# Patient Record
Sex: Female | Born: 1955 | Race: Asian | Hispanic: No | Marital: Married | Smoking: Never smoker
Health system: Southern US, Community
[De-identification: ages and names within clinical notes are randomized; demographics above are authoritative.]

## PROBLEM LIST (undated history)

## (undated) DIAGNOSIS — E079 Disorder of thyroid, unspecified: Secondary | ICD-10-CM

## (undated) DIAGNOSIS — I10 Essential (primary) hypertension: Secondary | ICD-10-CM

## (undated) DIAGNOSIS — T7840XA Allergy, unspecified, initial encounter: Secondary | ICD-10-CM

## (undated) DIAGNOSIS — G4730 Sleep apnea, unspecified: Secondary | ICD-10-CM

## (undated) DIAGNOSIS — K635 Polyp of colon: Secondary | ICD-10-CM

## (undated) DIAGNOSIS — K589 Irritable bowel syndrome without diarrhea: Secondary | ICD-10-CM

## (undated) DIAGNOSIS — Z860100 Personal history of colon polyps, unspecified: Secondary | ICD-10-CM

## (undated) DIAGNOSIS — E669 Obesity, unspecified: Secondary | ICD-10-CM

## (undated) DIAGNOSIS — E119 Type 2 diabetes mellitus without complications: Secondary | ICD-10-CM

## (undated) DIAGNOSIS — K219 Gastro-esophageal reflux disease without esophagitis: Secondary | ICD-10-CM

## (undated) DIAGNOSIS — J45909 Unspecified asthma, uncomplicated: Secondary | ICD-10-CM

## (undated) DIAGNOSIS — Z8781 Personal history of (healed) traumatic fracture: Secondary | ICD-10-CM

## (undated) DIAGNOSIS — M1990 Unspecified osteoarthritis, unspecified site: Secondary | ICD-10-CM

## (undated) DIAGNOSIS — E785 Hyperlipidemia, unspecified: Secondary | ICD-10-CM

## (undated) DIAGNOSIS — B019 Varicella without complication: Secondary | ICD-10-CM

## (undated) DIAGNOSIS — G43909 Migraine, unspecified, not intractable, without status migrainosus: Secondary | ICD-10-CM

## (undated) HISTORY — DX: Personal history of (healed) traumatic fracture: Z87.81

## (undated) HISTORY — DX: Migraine, unspecified, not intractable, without status migrainosus: G43.909

## (undated) HISTORY — PX: COLONOSCOPY: SHX174

## (undated) HISTORY — DX: Disorder of thyroid, unspecified: E07.9

## (undated) HISTORY — DX: Type 2 diabetes mellitus without complications: E11.9

## (undated) HISTORY — DX: Hyperlipidemia, unspecified: E78.5

## (undated) HISTORY — DX: Personal history of colonic polyps: Z86.010

## (undated) HISTORY — DX: Unspecified asthma, uncomplicated: J45.909

## (undated) HISTORY — DX: Unspecified osteoarthritis, unspecified site: M19.90

## (undated) HISTORY — DX: Gastro-esophageal reflux disease without esophagitis: K21.9

## (undated) HISTORY — DX: Polyp of colon: K63.5

## (undated) HISTORY — DX: Obesity, unspecified: E66.9

## (undated) HISTORY — DX: Allergy, unspecified, initial encounter: T78.40XA

## (undated) HISTORY — DX: Irritable bowel syndrome, unspecified: K58.9

## (undated) HISTORY — DX: Personal history of colon polyps, unspecified: Z86.0100

## (undated) HISTORY — DX: Sleep apnea, unspecified: G47.30

## (undated) HISTORY — DX: Essential (primary) hypertension: I10

## (undated) HISTORY — DX: Varicella without complication: B01.9

## (undated) HISTORY — PX: ESOPHAGOGASTRODUODENOSCOPY: SHX1529

---

## 2010-05-22 DIAGNOSIS — B029 Zoster without complications: Secondary | ICD-10-CM

## 2010-05-22 HISTORY — DX: Zoster without complications: B02.9

## 2014-05-22 HISTORY — PX: REPLACEMENT TOTAL KNEE: SUR1224

## 2017-02-28 ENCOUNTER — Encounter: Payer: Self-pay | Admitting: Family Medicine

## 2017-02-28 ENCOUNTER — Ambulatory Visit (INDEPENDENT_AMBULATORY_CARE_PROVIDER_SITE_OTHER): Payer: Medicare HMO | Admitting: Family Medicine

## 2017-02-28 VITALS — BP 130/80 | HR 67 | Temp 98.3°F | Ht 65.0 in | Wt 201.9 lb

## 2017-02-28 DIAGNOSIS — E785 Hyperlipidemia, unspecified: Secondary | ICD-10-CM

## 2017-02-28 DIAGNOSIS — K219 Gastro-esophageal reflux disease without esophagitis: Secondary | ICD-10-CM

## 2017-02-28 DIAGNOSIS — Z794 Long term (current) use of insulin: Secondary | ICD-10-CM

## 2017-02-28 DIAGNOSIS — I10 Essential (primary) hypertension: Secondary | ICD-10-CM

## 2017-02-28 DIAGNOSIS — J302 Other seasonal allergic rhinitis: Secondary | ICD-10-CM | POA: Diagnosis not present

## 2017-02-28 DIAGNOSIS — E119 Type 2 diabetes mellitus without complications: Secondary | ICD-10-CM

## 2017-02-28 DIAGNOSIS — Z7689 Persons encountering health services in other specified circumstances: Secondary | ICD-10-CM

## 2017-02-28 DIAGNOSIS — H04129 Dry eye syndrome of unspecified lacrimal gland: Secondary | ICD-10-CM | POA: Diagnosis not present

## 2017-02-28 DIAGNOSIS — R682 Dry mouth, unspecified: Secondary | ICD-10-CM | POA: Diagnosis not present

## 2017-02-28 DIAGNOSIS — E039 Hypothyroidism, unspecified: Secondary | ICD-10-CM | POA: Diagnosis not present

## 2017-02-28 DIAGNOSIS — M26609 Unspecified temporomandibular joint disorder, unspecified side: Secondary | ICD-10-CM | POA: Diagnosis not present

## 2017-02-28 LAB — BASIC METABOLIC PANEL
BUN: 7 mg/dL (ref 6–23)
CHLORIDE: 102 meq/L (ref 96–112)
CO2: 30 mEq/L (ref 19–32)
Calcium: 9.7 mg/dL (ref 8.4–10.5)
Creatinine, Ser: 0.81 mg/dL (ref 0.40–1.20)
GFR: 92.14 mL/min (ref >60.00)
Glucose, Bld: 86 mg/dL (ref 70–99)
POTASSIUM: 4.7 meq/L (ref 3.5–5.1)
Sodium: 139 mEq/L (ref 135–145)

## 2017-02-28 LAB — TSH: TSH: 0.49 u[IU]/mL (ref 0.35–4.50)

## 2017-02-28 LAB — HEMOGLOBIN A1C: HEMOGLOBIN A1C: 6.7 % — AB (ref 4.6–6.5)

## 2017-02-28 MED ORDER — INSULIN ASPART 100 UNIT/ML FLEXPEN: 10.0000 [IU] | PEN_INJECTOR | Freq: Three times a day (TID) | SUBCUTANEOUS | 11 refills | Status: DC

## 2017-02-28 MED ORDER — METOPROLOL TARTRATE 25 MG PO TABS: 25.0000 mg | ORAL_TABLET | Freq: Two times a day (BID) | ORAL | 1 refills | Status: DC

## 2017-02-28 MED ORDER — CYCLOBENZAPRINE HCL 10 MG PO TABS: 10.0000 mg | ORAL_TABLET | Freq: Three times a day (TID) | ORAL | 5 refills | Status: DC | PRN

## 2017-02-28 MED ORDER — LEVOTHYROXINE SODIUM 75 MCG PO TABS: 75.0000 ug | ORAL_TABLET | Freq: Every day | ORAL | 2 refills | Status: DC

## 2017-02-28 MED ORDER — INSULIN DETEMIR 100 UNIT/ML FLEXPEN: 50.0000 [IU] | PEN_INJECTOR | Freq: Two times a day (BID) | SUBCUTANEOUS | 11 refills | Status: DC

## 2017-02-28 MED ORDER — SIMVASTATIN 40 MG PO TABS: 40.0000 mg | ORAL_TABLET | Freq: Every day | ORAL | 3 refills | Status: DC

## 2017-02-28 MED ORDER — PEN NEEDLES 32G X 5 MM MISC: 1.0000 "application " | Freq: Every day | 11 refills | Status: DC

## 2017-02-28 MED ORDER — PANTOPRAZOLE SODIUM 40 MG PO TBEC: 40.0000 mg | DELAYED_RELEASE_TABLET | Freq: Every day | ORAL | 2 refills | Status: DC

## 2017-02-28 MED ORDER — BLOOD GLUC METER DISP-STRIPS DEVI: 11 refills | Status: DC

## 2017-02-28 MED ORDER — METFORMIN HCL 1000 MG PO TABS: 1000.0000 mg | ORAL_TABLET | Freq: Two times a day (BID) | ORAL | 1 refills | Status: DC

## 2017-02-28 NOTE — Patient Instructions (Addendum)
We have ordered labs at this visit. It can take up to 1-2 weeks for results and processing. If results require follow up or explanation, we will call you with instructions. Clinically stable results will be released to your [facility] or sent to you via mail. If you have not heard from [place] or cannot find your results in [facility] in 2 weeks please contact our office at [phone].   Diabetes Mellitus and Food It is important for you to manage your blood sugar (glucose) level. Your blood glucose level can be greatly affected by what you eat. Eating healthier foods in the appropriate amounts throughout the day at about the same time each day will help you control your blood glucose level. It can also help slow or prevent worsening of your diabetes mellitus. Healthy eating may even help you improve the level of your blood pressure and reach or maintain a healthy weight. General recommendations for healthful eating and cooking habits include:  Eating meals and snacks regularly. Avoid going long periods of time without eating to lose weight.  Eating a diet that consists mainly of plant-based foods, such as fruits, vegetables, nuts, legumes, and whole grains.  Using low-heat cooking methods, such as baking, instead of high-heat cooking methods, such as deep frying.  Work with your dietitian to make sure you understand how to use the Nutrition Facts information on food labels. How can food affect me? Carbohydrates Carbohydrates affect your blood glucose level more than any other type of food. Your dietitian will help you determine how many carbohydrates to eat at each meal and teach you how to count carbohydrates. Counting carbohydrates is important to keep your blood glucose at a healthy level, especially if you are using insulin or taking certain medicines for diabetes mellitus. Alcohol Alcohol can cause sudden decreases in blood glucose (hypoglycemia), especially if you use insulin or take certain medicines  for diabetes mellitus. Hypoglycemia can be a life-threatening condition. Symptoms of hypoglycemia (sleepiness, dizziness, and disorientation) are similar to symptoms of having too much alcohol. If your health care provider has given you approval to drink alcohol, do so in moderation and use the following guidelines:  Women should not have more than one drink per day, and men should not have more than two drinks per day. One drink is equal to: ? 12 oz of beer. ? 5 oz of wine. ? 1 oz of hard liquor.  Do not drink on an empty stomach.  Keep yourself hydrated. Have water, diet soda, or unsweetened iced tea.  Regular soda, juice, and other mixers might contain a lot of carbohydrates and should be counted.  What foods are not recommended? As you make food choices, it is important to remember that all foods are not the same. Some foods have fewer nutrients per serving than other foods, even though they might have the same number of calories or carbohydrates. It is difficult to get your body what it needs when you eat foods with fewer nutrients. Examples of foods that you should avoid that are high in calories and carbohydrates but low in nutrients include:  Trans fats (most processed foods list trans fats on the Nutrition Facts label).  Regular soda.  Juice.  Candy.  Sweets, such as cake, pie, doughnuts, and cookies.  Fried foods.  What foods can I eat? Eat nutrient-rich foods, which will nourish your body and keep you healthy. The food you should eat also will depend on several factors, including:  The calories you need.  The medicines you take.  Your weight.  Your blood glucose level.  Your blood pressure level.  Your cholesterol level.  You should eat a variety of foods, including:  Protein. ? Lean cuts of meat. ? Proteins low in saturated fats, such as fish, egg whites, and beans. Avoid processed meats.  Fruits and vegetables. ? Fruits and vegetables that may help  control blood glucose levels, such as apples, mangoes, and yams.  Dairy products. ? Choose fat-free or low-fat dairy products, such as milk, yogurt, and cheese.  Grains, bread, pasta, and rice. ? Choose whole grain products, such as multigrain bread, whole oats, and brown rice. These foods may help control blood pressure.  Fats. ? Foods containing healthful fats, such as nuts, avocado, olive oil, canola oil, and fish.  Does everyone with diabetes mellitus have the same meal plan? Because every person with diabetes mellitus is different, there is not one meal plan that works for everyone. It is very important that you meet with a dietitian who will help you create a meal plan that is just right for you. This information is not intended to replace advice given to you by your health care provider. Make sure you discuss any questions you have with your health care provider. Document Released: 02/02/2005 Document Revised: 10/14/2015 Document Reviewed: 04/04/2013 Elsevier Interactive Patient Education  2017 [place].  Hypothyroidism Hypothyroidism is a disorder of the thyroid. The thyroid is a large gland that is located in the lower front of the neck. The thyroid releases hormones that control how the body works. With hypothyroidism, the thyroid does not make enough of these hormones. What are the causes? Causes of hypothyroidism may include:  Viral infections.  Pregnancy.  Your own defense system (immune system) attacking your thyroid.  Certain medicines.  Birth defects.  Past radiation treatments to your head or neck.  Past treatment with radioactive iodine.  Past surgical removal of part or all of your thyroid.  Problems with the gland that is located in the center of your brain (pituitary).  What are the signs or symptoms? Signs and symptoms of hypothyroidism may include:  Feeling as though you have no energy (lethargy).  Inability to tolerate cold.  Weight gain  that is not explained by a change in diet or exercise habits.  Dry skin.  Coarse hair.  Menstrual irregularity.  Slowing of thought processes.  Constipation.  Sadness or depression.  How is this diagnosed? Your health care provider may diagnose hypothyroidism with blood tests and ultrasound tests. How is this treated? Hypothyroidism is treated with medicine that replaces the hormones that your body does not make. After you begin treatment, it may take several weeks for symptoms to go away. Follow these instructions at home:  Take medicines only as directed by your health care provider.  If you start taking any new medicines, tell your health care provider.  Keep all follow-up visits as directed by your health care provider. This is important. As your condition improves, your dosage needs may change. You will need to have blood tests regularly so that your health care provider can watch your condition. Contact a health care provider if:  Your symptoms do not get better with treatment.  You are taking thyroid replacement medicine and: ? You sweat excessively. ? You have tremors. ? You feel anxious. ? You lose weight rapidly. ? You cannot tolerate heat. ? You have emotional swings. ? You have diarrhea. ? You feel weak. Get help right away if:  You develop chest pain.  You develop an irregular heartbeat.  You develop a rapid heartbeat. This information is not intended to replace advice given to you by your health care provider. Make sure you discuss any questions you have with your health care provider. Document Released: 05/08/2005 Document Revised: 10/14/2015 Document Reviewed: 09/23/2013 Elsevier Interactive Patient Education  2017 [employer] Temporomandibular Joint Syndrome Temporomandibular joint (TMJ) syndrome is a condition that affects the joints between your jaw and your skull. The TMJs are located near your ears and allow your jaw to open and close. These  joints and the nearby muscles are involved in all movements of the jaw. People with TMJ syndrome have pain in the area of these joints and muscles. Chewing, biting, or other movements of the jaw can be difficult or painful. TMJ syndrome can be caused by various things. In many cases, the condition is mild and goes away within a few weeks. For some people, the condition can become a long-term problem. What are the causes? Possible causes of TMJ syndrome include:  Grinding your teeth or clenching your jaw. Some people do this when they are under stress.  Arthritis.  Injury to the jaw.  Head or neck injury.  Teeth or dentures that are not aligned well.  In some cases, the cause of TMJ syndrome may not be known. What are the signs or symptoms? The most common symptom is an aching pain on the side of the head in the area of the TMJ. Other symptoms may include:  Pain when moving your jaw, such as when chewing or biting.  Being unable to open your jaw all the way.  Making a clicking sound when you open your mouth.  Headache.  Earache.  Neck or shoulder pain.  How is this diagnosed? Diagnosis can usually be made based on your symptoms, your medical history, and a physical exam. Your health care provider may check the range of motion of your jaw. Imaging tests, such as X-rays or an MRI, are sometimes done. You may need to see your dentist to determine if your teeth and jaw are lined up correctly. How is this treated? TMJ syndrome often goes away on its own. If treatment is needed, the options may include:  Eating soft foods and applying ice or heat.  Medicines to relieve pain or inflammation.  Medicines to relax the muscles.  A splint, bite plate, or mouthpiece to prevent teeth grinding or jaw clenching.  Relaxation techniques or counseling to help reduce stress.  Transcutaneous electrical nerve stimulation (TENS). This helps to relieve pain by applying an electrical current  through the skin.  Acupuncture. This is sometimes helpful to relieve pain.  Jaw surgery. This is rarely needed.  Follow these instructions at home:  Take medicines only as directed by your health care provider.  Eat a soft diet if you are having trouble chewing.  Apply ice to the painful area. ? Put ice in a plastic bag. ? Place a towel between your skin and the bag. ? Leave the ice on for 20 minutes, 2-3 times a day.  Apply a warm compress to the painful area as directed.  Massage your jaw area and perform any jaw stretching exercises as recommended by your health care provider.  If you were given a mouthpiece or bite plate, wear it as directed.  Avoid foods that require a lot of chewing. Do not chew gum.  Keep all follow-up visits as directed by your health care provider. This  is important. Contact a health care provider if:  You are having trouble eating.  You have new or worsening symptoms. Get help right away if:  Your jaw locks open or closed. This information is not intended to replace advice given to you by your health care provider. Make sure you discuss any questions you have with your health care provider. Document Released: 01/31/2001 Document Revised: 01/06/2016 Document Reviewed: 12/11/2013 Elsevier Interactive Patient Education  [employer].

## 2017-02-28 NOTE — Progress Notes (Signed)
Patient presents to clinic today to establish care.  SUBJECTIVE: PMH:  Pt is a 61 yo female with pmh sig for DM, hypothyroidism with nodule, dry eyes, HTN, TMJ, and dry mouth.  Pt was formerly seen by Dr. Merita Norton in [place].  Pt was also seen at [facility].  DM: -dx'd in 2008 - +family hx, brother,sisters -tried oral meds, but they did not control her bs -Taking levemir flex pen 50 U BID and Novolog 10 u TID with meals -Also taking metformin 1000 mg BID -fsbs at home range 80-110 in am also checks at night.  Hypothyroidism: -taking Synthroid 75 mcg daily  ~Name brand worked better for her than generic~ -also had nodules on thyroid.  U/S in 2016. -had biopsy -requesting Endo referral for f/u  Allergies: -takes allegra prn and flonase -notices slight increase in eye irritation since moving here  Dry Eyes: -x 20 yrs -uses gtts during the day and ointment at night -if does not use ointment at night wakes up with painful eyes  HTN: -taking metoprolol 25 mg BID  TMJ: -chronic  -takes flexaril BID.  Some days pain requires a third dose  Dry Mouth: -chronic -has tried biotiene mouth rinse, but it did not help -mouth gets so dry that it can cause sores on the inside of her cheeks -chews gum, but that causes pain 2/2 TMJ  Allergies: NKDA  PSurgHx: -L TKR 2016   Social Hx: Pt and her husband recently moved to the area from [place].  Pt has 3 kids.  She has a daughter and a son here in [place].  She is living with her daughter until her house in [place] sells.  Pt has another daughter who is still in [place].  Pt denies tobacco, EtOH, and drug use.    Past Medical History:  Diagnosis Date  . Allergy   . Arthritis   . Chicken pox   . Colon polyps   . Diabetes mellitus without complication ([place])   . GERD (gastroesophageal reflux disease)   . Hyperlipidemia   . Hypertension   . Migraines     Past Surgical History:  Procedure  Laterality Date  . REPLACEMENT TOTAL KNEE Left 2016    No current outpatient prescriptions on file prior to visit.   No current facility-administered medications on file prior to visit.     No Known Allergies  History reviewed. No pertinent family history.  Social History   Social History  . Marital status: Married    Spouse name: N/A  . Number of children: N/A  . Years of education: N/A   Occupational History  . Not on file.   Social History Main Topics  . Smoking status: Never Smoker  . Smokeless tobacco: Never Used  . Alcohol use No  . Drug use: No  . Sexual activity: Not on file   Other Topics Concern  . Not on file   Social History Narrative  . No narrative on file    ROS General: Denies fever, chills, night sweats, changes in weight, changes in appetite HEENT: Denies headaches, ear pain, changes in vision, rhinorrhea, sore throat  +jaw pain, dry eyes, dry mouth, thyroid nodule. CV: Denies CP, palpitations, SOB, orthopnea Pulm: Denies SOB, cough, wheezing GI: Denies abdominal pain, nausea, vomiting, diarrhea, constipation GU: Denies dysuria, hematuria, frequency, vaginal discharge Msk: Denies muscle cramps, joint pains Neuro: Denies weakness, numbness, tingling Skin: Denies rashes, bruising Psych: Denies depression, anxiety, hallucinations  BP 130/80 (BP  Location: Right Arm, Patient Position: Sitting, Cuff Size: Normal)   Pulse 67   Temp 98.3 F (36.8 C) (Oral)   Ht 5' 5"  (1.651 m)   Wt 201 lb 14.4 oz (91.6 kg)   BMI 33.60 kg/m   Physical Exam Gen. Pleasant, well developed, well-nourished, in NAD HEENT - [place]/AT, PERRL, no scleral icterus, no nasal drainage, pharynx without erythema or exudate.  No enlarged parotid glands. Neck: No JVD, mild thyromegaly, no carotid bruits Lungs: no accessory muscle use, CTAB, no wheezes, rales or rhonchi Cardiovascular: RRR, No r/g/m, no peripheral edema Abdomen: BS present, soft, nontender,nondistended, no  hepatosplenomegaly Musculoskeletal: No deformities, moves all four extremities, no cyanosis or clubbing, normal tone.  L TKR, well healed. Neuro:  A&Ox3, CN II-XII intact, normal gait Skin:  Warm, dry, intact, no lesions Psych: normal affect, mood appropriate  No results found for this or any previous visit (from the past 2160 hour(s)).  Assessment/Plan: Controlled type 2 diabetes mellitus without complication, with long-term current use of insulin ([place])  -encouraged to continue eating better and drinking plenty of water -will order labs and refill current meds. -continue checking fsbs - Plan: insulin aspart (NOVOLOG FLEXPEN) 100 UNIT/ML FlexPen, Insulin Detemir (LEVEMIR FLEXPEN) 100 UNIT/ML Pen, metFORMIN (GLUCOPHAGE) 1000 MG tablet, Hemoglobin A1c, Ambulatory referral to Endocrinology  Acquired hypothyroidism  -Continue Synthroid 75 mcg daily -mildly enlarged gland on exam -will obtain labs and place referral to Endo for u/s and f/u - Plan: TSH, levothyroxine (SYNTHROID, LEVOTHROID) 75 MCG tablet, Ambulatory referral to Endocrinology, TSH  Seasonal allergies -continue allegra prn and flonase -discussed proper nasal spray use.  Dry eye -continue eye gtts and ointment -discussed finding an Ophthalmologist for possibly newer treatment options.  Essential hypertension  -bp controlled 130/80 -continue current meds - Plan: CBC with Differential/Platelet, Basic metabolic panel, metoprolol tartrate (LOPRESSOR) 25 MG tablet, CBC with Differential/Platelet  Dry mouth - given dry eyes and mouth consider Sjogren's, however no enlarged Parotid gland on exam  TMJ (temporomandibular joint disorder)  -worsened by gum chewing for  - Plan: cyclobenzaprine (FLEXERIL) 10 MG tablet  Hyperlipidemia, unspecified hyperlipidemia type  - Plan: simvastatin (ZOCOR) 40 MG tablet  Gastroesophageal reflux disease without esophagitis  -avoid triggers - Plan: pantoprazole (PROTONIX) 40 MG  tablet  Encounter to establish care -records release   Refill given on all meds, pen needles, and strips.  RTC in 1-2 mos

## 2017-03-01 LAB — CBC WITH DIFFERENTIAL/PLATELET
BASOS ABS: 0.1 10*3/uL (ref 0.0–0.1)
Basophils Relative: 0.8 % (ref 0.0–3.0)
EOS PCT: 7.9 % — AB (ref 0.0–5.0)
Eosinophils Absolute: 0.6 10*3/uL (ref 0.0–0.7)
HEMATOCRIT: 37.4 % (ref 36.0–46.0)
HEMOGLOBIN: 12.1 g/dL (ref 12.0–15.0)
LYMPHS ABS: 2.5 10*3/uL (ref 0.7–4.0)
LYMPHS PCT: 35.6 % (ref 12.0–46.0)
MCHC: 32.2 g/dL (ref 30.0–36.0)
MCV: 81.7 fl (ref 78.0–100.0)
MONOS PCT: 6.4 % (ref 3.0–12.0)
Monocytes Absolute: 0.5 10*3/uL (ref 0.1–1.0)
NEUTROS PCT: 49.3 % (ref 43.0–77.0)
Neutro Abs: 3.4 10*3/uL (ref 1.4–7.7)
Platelets: 314 10*3/uL (ref 150.0–400.0)
RBC: 4.58 Mil/uL (ref 3.87–5.11)
RDW: 15 % (ref 11.5–15.5)
WBC: 7 10*3/uL (ref 4.0–10.5)

## 2017-03-26 ENCOUNTER — Telehealth: Payer: Self-pay | Admitting: Family Medicine

## 2017-03-26 NOTE — Telephone Encounter (Signed)
Pt was seen on 02-28-17 and her new patient visit was not covered. Pt has [employer]. The bill was over 500.00 .

## 2017-03-30 ENCOUNTER — Ambulatory Visit: Payer: Medicare HMO | Admitting: Endocrinology

## 2017-05-01 ENCOUNTER — Ambulatory Visit: Payer: Medicare HMO | Admitting: Endocrinology

## 2017-05-24 NOTE — Telephone Encounter (Signed)
Patient calling again, was told that this was fixed. Still receiving bill. Please advise

## 2017-05-25 ENCOUNTER — Encounter: Payer: Self-pay | Admitting: Family Medicine

## 2017-05-25 ENCOUNTER — Ambulatory Visit (INDEPENDENT_AMBULATORY_CARE_PROVIDER_SITE_OTHER)
Admission: RE | Admit: 2017-05-25 | Discharge: 2017-05-25 | Disposition: A | Discharge status: Home / Self Care | Payer: Medicare HMO | Source: Ambulatory Visit | Attending: Family Medicine | Admitting: Family Medicine

## 2017-05-25 ENCOUNTER — Ambulatory Visit (INDEPENDENT_AMBULATORY_CARE_PROVIDER_SITE_OTHER): Payer: Medicare HMO | Admitting: Family Medicine

## 2017-05-25 VITALS — BP 140/90 | HR 67 | Temp 98.8°F | Wt 202.0 lb

## 2017-05-25 DIAGNOSIS — E118 Type 2 diabetes mellitus with unspecified complications: Secondary | ICD-10-CM | POA: Diagnosis not present

## 2017-05-25 DIAGNOSIS — M19071 Primary osteoarthritis, right ankle and foot: Secondary | ICD-10-CM | POA: Diagnosis not present

## 2017-05-25 DIAGNOSIS — K5190 Ulcerative colitis, unspecified, without complications: Secondary | ICD-10-CM | POA: Diagnosis not present

## 2017-05-25 DIAGNOSIS — Z794 Long term (current) use of insulin: Secondary | ICD-10-CM

## 2017-05-25 DIAGNOSIS — M79671 Pain in right foot: Secondary | ICD-10-CM

## 2017-05-25 MED ORDER — INSULIN DETEMIR 100 UNIT/ML FLEXPEN: 50.0000 [IU] | PEN_INJECTOR | Freq: Two times a day (BID) | SUBCUTANEOUS | 11 refills | Status: DC

## 2017-05-25 MED ORDER — MESALAMINE ER 500 MG PO CPCR: 500.0000 mg | ORAL_CAPSULE | Freq: Four times a day (QID) | ORAL | 0 refills | Status: DC

## 2017-05-25 NOTE — Progress Notes (Signed)
Subjective:    Patient ID: Terri Gray, female    DOB: 12/29/1955, 62 y.o.   MRN: [id number]  No chief complaint on file.   HPI Patient was seen today for acute concern and follow-up.  Patient states she has been having right lateral foot pain times 3 weeks.  She cannot recall injury.  She is tried BenGay, wrapping the ankle and an ankle brace with some relief.  Patient has been out of town as her daughter had to have emergency heart surgery.  Patient also endorses history of ulcerative colitis.  She states she is currently having an ulcerative colitis flare and is in need of medication to help resolve this.  Patient denies hematochezia.  Patient also endorses being out of Levemir.  Patient typically uses 5 boxes/month, however the pharmacy only gave her a few pens.  Pt attempted to contact clinic.     Past Medical History:  Diagnosis Date  . Allergy   . Arthritis   . Chicken pox   . Colon polyps   . Diabetes mellitus without complication ([place])   . GERD (gastroesophageal reflux disease)   . Hyperlipidemia   . Hypertension   . Migraines     No Known Allergies  ROS General: Denies fever, chills, night sweats, changes in weight, changes in appetite HEENT: Denies headaches, ear pain, changes in vision, rhinorrhea, sore throat CV: Denies CP, palpitations, SOB, orthopnea Pulm: Denies SOB, cough, wheezing GI: Denies nausea, vomiting, diarrhea, constipation  +abd pain/UC flare GU: Denies dysuria, hematuria, frequency, vaginal discharge Msk: Denies muscle cramps, joint pains +R foot pain Neuro: Denies weakness, numbness, tingling Skin: Denies rashes, bruising Psych: Denies depression, anxiety, hallucinations     Objective:    Blood pressure 140/90, pulse 67, temperature 98.8 F (37.1 C), temperature source Oral, weight 202 lb (91.6 kg).   Gen. Pleasant, well-nourished, in no distress, normal affect HEENT: [place]/AT, face symmetric, no scleral icterus, PERRLA, nares patent  without drainage  Lungs: no accessory muscle use, CTAB, no wheezes or rales Cardiovascular: RRR, no m/r/g, no peripheral edema Abdomen: BS present, soft, NT/ND Musculoskeletal: No deformities, no cyanosis or clubbing, normal tone.  TTP of R foot and ankle at lateral side of calcanus inferior to the fibula and navicular bone Neuro:  A&Ox3, CN II-XII intact, normal gait Skin:  Warm, no lesions/ rash   Wt Readings from Last 3 Encounters:  05/25/17 202 lb (91.6 kg)  02/28/17 201 lb 14.4 oz (91.6 kg)    Lab Results  Component Value Date   WBC 7.0 02/28/2017   HGB 12.1 02/28/2017   HCT 37.4 02/28/2017   PLT 314.0 02/28/2017   GLUCOSE 86 02/28/2017   NA 139 02/28/2017   K 4.7 02/28/2017   CL 102 02/28/2017   CREATININE 0.81 02/28/2017   BUN 7 02/28/2017   CO2 30 02/28/2017   TSH 0.49 02/28/2017   HGBA1C 6.7 (H) 02/28/2017    Assessment/Plan:  Foot pain, right -Duration of symptoms and unclear mechanism of injury proceed with x-ray. -Will contact patient later this afternoon with x-ray results.  Based on x-ray results will then decide if continued conservative treatment is recommended or referral is needed - Plan: DG Foot Complete Right  Ulcerative colitis without complications, unspecified location ([place]) - Plan: mesalamine (PENTASA) 500 MG CR capsule  Controlled type 2 diabetes mellitus with complication, with long-term current use of insulin ([place]) -Continue lifestyle modifications and regular blood sugar monitoring at home -Continue NovoLog 10 units 3 times daily - Plan:  Insulin Detemir (LEVEMIR FLEXPEN) 100 UNIT/ML Pen -We will do foot exam at next visit.     Follow-up in 1 month.  Sooner if needed   Update: Patient contacted in regards to x-ray of right foot results.  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  Patient advised to wear supportive shoe, use Tylenol or NSAIDs for pain relief, ice, stretching.  Reviewed stretching  exercises over the phone with patient.  Patient recalls being told she had a heel spur before.  Patient will contact clinic if pain continues.   [name], MD

## 2017-05-25 NOTE — Patient Instructions (Addendum)
Ulcerative Colitis, Adult Ulcerative colitis is long-lasting (chronic) swelling (inflammation) of the large intestine (colon). Sores (ulcers) may also form on the colon. Ulcerative colitis is closely related to another condition of inflammation of the intestines that is called Crohn disease. Together, they are frequently referred to as inflammatory bowel disease (IBD). What are the causes? Ulcerative colitis is caused by increased activity of the immune system in the intestines. The immune system is the system that protects the body against harmful bacteria, viruses, fungi, and other things that can make you sick. When the immune system overacts, it causes inflammation. The cause of the increased immune system activity is not known. What increases the risk? Risk factors of ulcerative colitis include:  Age. This includes: ? Being 67-58 years old. ? Being older than 63 years old.  Having a family history of ulcerative colitis.  Being of Jewish descent.  What are the signs or symptoms? Common symptoms of ulcerative colitis include rectal bleeding and diarrhea. There is a wide range of symptoms, and a person's symptoms depend on how severe the condition is. Additional symptoms may include:  Pain or cramping in the belly (abdomen).  Fever.  Fatigue.  Weight loss.  Night sweats.  Rectal pain.  Feeling the immediate need to have a bowel movement.  Nausea.  Loss of appetite.  Anemia.  Joint pain or soreness.  Eye irritation.  Certain skin rashes.  How is this diagnosed? Ulcerative colitis may be diagnosed by:  Medical history and physical exam.  Blood tests and stool tests.  X-rays.  CT scans.  Colonoscopy. For this test, a flexible tube is inserted into your anus and your colon is examined.  Examination of a tissue sample from your colon (biopsy).  How is this treated? Treatment for ulcerative colitis may include medicines to:  Decrease inflammation.  Control  your immune system.  Surgery may also be necessary. Follow these instructions at home: Medicines and vitamins  Take medicines only as directed by your doctor. Do not take aspirin.  Ask your doctor if you should take any vitamins or supplements. Lifestyle  Exercise regularly.  Limit alcohol intake to no more than 1 drink per day for nonpregnant women and 2 drinks per day for men. One drink equals 12 ounces of beer, 5 ounces of wine, or 1 ounces of hard liquor. Eating and drinking  Drink enough fluid to keep your urine clear or pale yellow.  Ask your health care provider about the best diet for you. Follow the diet as directed by your health care provider. This may include: ? Avoiding carbonated drinks. ? Avoiding popcorn, vegetable skins, nuts, and other high-fiber foods when you have symptoms of ulcerative colitis. ? Eating smaller meals more often. ? Keeping a food diary. This may help you to find and avoid any foods that make you feel not well.  Limit your caffeine intake. General instructions  Keep all follow-up appointments as directed by your health care provider. This is important. Contact a health care provider if:  Your symptoms do not improve or get worse with treatment.  You continue to lose weight.  You have constant cramps or loose bowels.  You develop a new skin rash, skin sores, or eye problems.  You have a fever or chills. Get help right away if:  You have bloody diarrhea.  You have severe pain in your abdomen.  You vomit. This information is not intended to replace advice given to you by your health care provider. Make sure  you discuss any questions you have with your health care provider. Document Released: 02/15/2005 Document Revised: 01/09/2016 Document Reviewed: 08/31/2014 Elsevier Interactive Patient Education  [employer].

## 2017-05-29 ENCOUNTER — Encounter: Payer: Self-pay | Admitting: Endocrinology

## 2017-05-29 ENCOUNTER — Ambulatory Visit: Payer: Medicare HMO | Admitting: Endocrinology

## 2017-05-29 DIAGNOSIS — E1142 Type 2 diabetes mellitus with diabetic polyneuropathy: Secondary | ICD-10-CM

## 2017-05-29 DIAGNOSIS — E119 Type 2 diabetes mellitus without complications: Secondary | ICD-10-CM | POA: Diagnosis not present

## 2017-05-29 DIAGNOSIS — Z794 Long term (current) use of insulin: Secondary | ICD-10-CM

## 2017-05-29 MED ORDER — INSULIN GLARGINE 100 UNIT/ML SOLOSTAR PEN: 60.0000 [IU] | PEN_INJECTOR | Freq: Every day | SUBCUTANEOUS | 11 refills | Status: DC

## 2017-05-29 MED ORDER — PEN NEEDLES 32G X 5 MM MISC: 1.0000 "application " | Freq: Four times a day (QID) | 11 refills | Status: DC

## 2017-05-29 MED ORDER — GLUCOSE BLOOD VI STRP: 1.0000 | ORAL_STRIP | Freq: Two times a day (BID) | 3 refills | Status: DC

## 2017-05-29 MED ORDER — INSULIN ASPART 100 UNIT/ML FLEXPEN
10.0000 [IU] | PEN_INJECTOR | Freq: Three times a day (TID) | SUBCUTANEOUS | 11 refills | Status: DC
Start: 2017-05-29 — End: 2017-08-27

## 2017-05-29 NOTE — Progress Notes (Signed)
Subjective:    Patient ID: Terri Gray, female    DOB: 1955/09/28, 62 y.o.   MRN: [id number]  HPI pt is referred by Dr Volanda Napoleon, for diabetes.  Pt states DM was dx'ed in 2008; she has mild neuropathy of the lower extremities, and associated pain; she has been on insulin since 2012; pt says her diet and exercise are fair; she has never had GDM, pancreatitis, pancreatic surgery, severe hypoglycemia or DKA.  She takes multiple daily injections.  She says cbg's vary from 79-290.  It is in general higher as the day goes on.  Ins no longer pays for levemir.   Past Medical History:  Diagnosis Date  . Allergy   . Arthritis   . Chicken pox   . Colon polyps   . Diabetes mellitus without complication ([place])   . GERD (gastroesophageal reflux disease)   . Hyperlipidemia   . Hypertension   . Migraines     Past Surgical History:  Procedure Laterality Date  . REPLACEMENT TOTAL KNEE Left 2016    Social History   Socioeconomic History  . Marital status: Married    Spouse name: Not on file  . Number of children: Not on file  . Years of education: Not on file  . Highest education level: Not on file  Social Needs  . Financial resource strain: Not on file  . Food insecurity - worry: Not on file  . Food insecurity - inability: Not on file  . Transportation needs - medical: Not on file  . Transportation needs - non-medical: Not on file  Occupational History  . Not on file  Tobacco Use  . Smoking status: Never Smoker  . Smokeless tobacco: Never Used  Substance and Sexual Activity  . Alcohol use: No  . Drug use: No  . Sexual activity: Not on file  Other Topics Concern  . Not on file  Social History Narrative  . Not on file    Current Outpatient Medications on File Prior to Visit  Medication Sig Dispense Refill  . cyclobenzaprine (FLEXERIL) 10 MG tablet Take 1 tablet (10 mg total) by mouth 3 (three) times daily as needed for muscle spasms. 90 tablet 5  . levothyroxine (SYNTHROID,  LEVOTHROID) 75 MCG tablet Take 1 tablet (75 mcg total) by mouth daily before breakfast. 90 tablet 2  . mesalamine (PENTASA) 500 MG CR capsule Take 1 capsule (500 mg total) by mouth 4 (four) times daily. 112 capsule 0  . metFORMIN (GLUCOPHAGE) 1000 MG tablet Take 1 tablet (1,000 mg total) by mouth 2 (two) times daily with a meal. 180 tablet 1  . metoprolol tartrate (LOPRESSOR) 25 MG tablet Take 1 tablet (25 mg total) by mouth 2 (two) times daily. 180 tablet 1  . pantoprazole (PROTONIX) 40 MG tablet Take 1 tablet (40 mg total) by mouth daily. 90 tablet 2  . simvastatin (ZOCOR) 40 MG tablet Take 1 tablet (40 mg total) by mouth daily. 90 tablet 3   No current facility-administered medications on file prior to visit.     No Known Allergies  Family History  Problem Relation Age of Onset  . Diabetes Father     BP 132/90 (BP Location: Left Arm, Patient Position: Sitting, Cuff Size: Normal)   Pulse 64   Wt 201 lb 3.2 oz (91.3 kg)   SpO2 98%   BMI 33.48 kg/m   Review of Systems denies blurry vision, sob, n/v, urinary frequency, excessive diaphoresis, memory loss, depression, and cold intolerance.  She  has lost weight (15 lbs), due to her efforts.  She has headache, leg cramps, rhinorrhea, and easy bruising.  She hs chronic intermitt chest pain (she says cardiac eval was neg).     Objective:   Physical Exam VS: see vs page GEN: no distress HEAD: head: no deformity eyes: no periorbital swelling, no proptosis external nose and ears are normal mouth: no lesion seen NECK: supple, thyroid is not enlarged CHEST WALL: no deformity LUNGS: clear to auscultation CV: reg rate and rhythm, no murmur ABD: abdomen is soft, nontender.  no hepatosplenomegaly.  not distended.  no hernia MUSCULOSKELETAL: muscle bulk and strength are grossly normal.  no obvious joint swelling.  gait is normal and steady EXTEMITIES: no deformity.  no ulcer on the feet.  feet are of normal color and temp.  no edema.  There  is bilateral onychomycosis of the toenails PULSES: dorsalis pedis intact bilat.  no carotid bruit NEURO:  cn 2-12 grossly intact.   readily moves all 4's.  sensation is intact to touch on the feet, but decreased from normal SKIN:  Normal texture and temperature.  No rash or suspicious lesion is visible.   NODES:  None palpable at the neck PSYCH: alert, well-oriented.  Does not appear anxious nor depressed.   A1c=6.9%  Lab Results  Component Value Date   CREATININE 0.81 02/28/2017   BUN 7 02/28/2017   NA 139 02/28/2017   K 4.7 02/28/2017   CL 102 02/28/2017   CO2 30 02/28/2017   I have reviewed outside records, and summarized: Pt was noted to have elevated a1c, and referred here. Problems addressed were UC and foot pain.  She had run out of levemir     Assessment & Plan:  Insulin-requiring type 2 DM, with painful polyneuropathy, new to me.  Based on the pattern of her cbg's, she needs some adjustment in her therapy.   Patient Instructions  good diet and exercise significantly improve the control of your diabetes.  please let me know if you wish to be referred to a dietician.  high blood sugar is very risky to your health.  you should see an eye doctor and dentist every year.  It is very important to get all recommended vaccinations.  Controlling your blood pressure and cholesterol drastically reduces the damage diabetes does to your body.  Those who smoke should quit.  Please discuss these with your doctor.  check your blood sugar twice a day.  vary the time of day when you check, between before the 3 meals, and at bedtime.  also check if you have symptoms of your blood sugar being too high or too low.  please keep a record of the readings and bring it to your next appointment here (or you can bring the meter itself).  You can write it on any piece of paper.  please call us sooner if your blood sugar goes below 70, or if you have a lot of readings over 200.  For now, please: Please  continue the same novolog, and:  Change the levemir to lantus, 60 units at bedtime, and: Please continue the same metformin.   Please call or message [place] next week, to tell us how the blood sugar is doing.   Please come back for a follow-up appointment in 2 months.

## 2017-05-29 NOTE — Patient Instructions (Addendum)
good diet and exercise significantly improve the control of your diabetes.  please let me know if you wish to be referred to a dietician.  high blood sugar is very risky to your health.  you should see an eye doctor and dentist every year.  It is very important to get all recommended vaccinations.  Controlling your blood pressure and cholesterol drastically reduces the damage diabetes does to your body.  Those who smoke should quit.  Please discuss these with your doctor.  check your blood sugar twice a day.  vary the time of day when you check, between before the 3 meals, and at bedtime.  also check if you have symptoms of your blood sugar being too high or too low.  please keep a record of the readings and bring it to your next appointment here (or you can bring the meter itself).  You can write it on any piece of paper.  please call us sooner if your blood sugar goes below 70, or if you have a lot of readings over 200.  For now, please: Please continue the same novolog, and:  Change the levemir to lantus, 60 units at bedtime, and: Please continue the same metformin.   Please call or message [place] next week, to tell us how the blood sugar is doing.   Please come back for a follow-up appointment in 2 months.

## 2017-05-30 DIAGNOSIS — E119 Type 2 diabetes mellitus without complications: Secondary | ICD-10-CM | POA: Insufficient documentation

## 2017-05-30 LAB — POCT GLYCOSYLATED HEMOGLOBIN (HGB A1C): Hemoglobin A1C: 6.9

## 2017-06-19 ENCOUNTER — Other Ambulatory Visit: Payer: Self-pay

## 2017-06-19 MED ORDER — INSULIN PEN NEEDLE 32G X 5 MM MISC: 2 refills | Status: DC

## 2017-07-27 ENCOUNTER — Ambulatory Visit: Payer: Medicare HMO | Admitting: Endocrinology

## 2017-07-27 DIAGNOSIS — Z0289 Encounter for other administrative examinations: Secondary | ICD-10-CM

## 2017-08-23 ENCOUNTER — Ambulatory Visit: Payer: Medicare HMO | Admitting: Endocrinology

## 2017-08-24 ENCOUNTER — Ambulatory Visit: Payer: Medicare HMO | Admitting: Family Medicine

## 2017-08-24 DIAGNOSIS — Z0289 Encounter for other administrative examinations: Secondary | ICD-10-CM

## 2017-08-27 ENCOUNTER — Encounter: Payer: Self-pay | Admitting: Endocrinology

## 2017-08-27 ENCOUNTER — Ambulatory Visit: Payer: Medicare HMO | Admitting: Endocrinology

## 2017-08-27 ENCOUNTER — Telehealth: Payer: Self-pay | Admitting: Endocrinology

## 2017-08-27 ENCOUNTER — Other Ambulatory Visit: Payer: Self-pay

## 2017-08-27 VITALS — BP 142/96 | HR 61 | Wt 204.4 lb

## 2017-08-27 DIAGNOSIS — E039 Hypothyroidism, unspecified: Secondary | ICD-10-CM

## 2017-08-27 DIAGNOSIS — Z794 Long term (current) use of insulin: Principal | ICD-10-CM

## 2017-08-27 DIAGNOSIS — E1142 Type 2 diabetes mellitus with diabetic polyneuropathy: Secondary | ICD-10-CM | POA: Diagnosis not present

## 2017-08-27 DIAGNOSIS — E119 Type 2 diabetes mellitus without complications: Secondary | ICD-10-CM

## 2017-08-27 LAB — POCT GLYCOSYLATED HEMOGLOBIN (HGB A1C): Hemoglobin A1C: 6.9

## 2017-08-27 MED ORDER — INSULIN DETEMIR 100 UNIT/ML FLEXPEN: 70.0000 [IU] | PEN_INJECTOR | Freq: Every day | SUBCUTANEOUS | 11 refills | Status: DC

## 2017-08-27 MED ORDER — INSULIN ASPART 100 UNIT/ML FLEXPEN: 20.0000 [IU] | PEN_INJECTOR | Freq: Three times a day (TID) | SUBCUTANEOUS | 11 refills | Status: DC

## 2017-08-27 MED ORDER — LEVOTHYROXINE SODIUM 75 MCG PO TABS: 75.0000 ug | ORAL_TABLET | Freq: Every day | ORAL | 2 refills | Status: DC

## 2017-08-27 MED ORDER — METFORMIN HCL 1000 MG PO TABS: 1000.0000 mg | ORAL_TABLET | Freq: Two times a day (BID) | ORAL | 1 refills | Status: DC

## 2017-08-27 NOTE — Patient Instructions (Addendum)
Your blood pressure is high today.  Please see your primary care provider soon, to have it rechecked check your blood sugar twice a day.  vary the time of day when you check, between before the 3 meals, and at bedtime.  also check if you have symptoms of your blood sugar being too high or too low.  please keep a record of the readings and bring it to your next appointment here (or you can bring the meter itself).  You can write it on any piece of paper.  please call us sooner if your blood sugar goes below 70, or if you have a lot of readings over 200.  Please the diabetes meds as listed below Please come back for a follow-up appointment in 2 months.

## 2017-08-27 NOTE — Telephone Encounter (Signed)
I have sent to patient;'s pharmacy.  

## 2017-08-27 NOTE — Progress Notes (Signed)
Subjective:    Patient ID: Terri Gray, female    DOB: [birth year], 62 y.o.   MRN: [id number]  HPI Pt returns for f/u of diabetes mellitus: DM type: Insulin-requiring type 2.  Dx'ed: 7673 Complications: polyneuropathy Therapy: insulin since 2012 GDM: never DKA: never Severe hypoglycemia: never Pancreatitis: never Pancreatic imaging: never Other: she takes multiple daily injections Pt reports symptoms: headache Pt takes meds as rx'ed.  She take levemir, 50 units bid, and novolog, 10 units 3 times a day (just before each meal).  no cbg record, but states cbg's vary from 78-190.  It is lowest in the afternoon, and highest at HS and fasting.   Past Medical History:  Diagnosis Date  . Allergy   . Arthritis   . Chicken pox   . Colon polyps   . Diabetes mellitus without complication ([place])   . GERD (gastroesophageal reflux disease)   . Hyperlipidemia   . Hypertension   . Migraines     Past Surgical History:  Procedure Laterality Date  . REPLACEMENT TOTAL KNEE Left 2016    Social History   Socioeconomic History  . Marital status: Married    Spouse name: Not on file  . Number of children: Not on file  . Years of education: Not on file  . Highest education level: Not on file  Occupational History  . Not on file  Social Needs  . Financial resource strain: Not on file  . Food insecurity:    Worry: Not on file    Inability: Not on file  . Transportation needs:    Medical: Not on file    Non-medical: Not on file  Tobacco Use  . Smoking status: Never Smoker  . Smokeless tobacco: Never Used  Substance and Sexual Activity  . Alcohol use: No  . Drug use: No  . Sexual activity: Not on file  Lifestyle  . Physical activity:    Days per week: Not on file    Minutes per session: Not on file  . Stress: Not on file  Relationships  . Social connections:    Talks on phone: Not on file    Gets together: Not on file    Attends religious service: Not on file    Active  member of club or organization: Not on file    Attends meetings of clubs or organizations: Not on file    Relationship status: Not on file  . Intimate partner violence:    Fear of current or ex partner: Not on file    Emotionally abused: Not on file    Physically abused: Not on file    Forced sexual activity: Not on file  Other Topics Concern  . Not on file  Social History Narrative  . Not on file    Current Outpatient Medications on File Prior to Visit  Medication Sig Dispense Refill  . cyclobenzaprine (FLEXERIL) 10 MG tablet Take 1 tablet (10 mg total) by mouth 3 (three) times daily as needed for muscle spasms. 90 tablet 5  . Insulin Pen Needle 32G X 5 MM MISC USE 1 PEN NEEDLE 4 TIMES DAILY 300 each 2  . mesalamine (PENTASA) 500 MG CR capsule Take 1 capsule (500 mg total) by mouth 4 (four) times daily. 112 capsule 0  . simvastatin (ZOCOR) 40 MG tablet Take 1 tablet (40 mg total) by mouth daily. 90 tablet 3   No current facility-administered medications on file prior to visit.     No Known Allergies  Family History  Problem Relation Age of Onset  . Diabetes Father     BP (!) 142/96 (BP Location: Left Arm, Patient Position: Sitting, Cuff Size: Normal)   Pulse 61   Wt 204 lb 6.4 oz (92.7 kg)   SpO2 97%   BMI 34.01 kg/m   Review of Systems She denies hypoglycemia.      Objective:   Physical Exam VITAL SIGNS:  See vs page GENERAL: no distress Pulses: dorsalis pedis intact bilat.   MSK: no deformity of the feet CV: no leg edema Skin:  no ulcer on the feet.  normal color and temp on the feet. Neuro: sensation is intact to touch on the feet.    Lab Results  Component Value Date   HGBA1C 6.9 08/27/2017       Assessment & Plan:  HTN: is noted today Insulin-requiring type 2 DM, with polyneuropathy: Based on the pattern of her cbg's, she needs some adjustment in her therapy   Patient Instructions  Your blood pressure is high today.  Please see your primary care  provider soon, to have it rechecked check your blood sugar twice a day.  vary the time of day when you check, between before the 3 meals, and at bedtime.  also check if you have symptoms of your blood sugar being too high or too low.  please keep a record of the readings and bring it to your next appointment here (or you can bring the meter itself).  You can write it on any piece of paper.  please call us sooner if your blood sugar goes below 70, or if you have a lot of readings over 200.  Please the diabetes meds as listed below Please come back for a follow-up appointment in 2 months.

## 2017-08-27 NOTE — Telephone Encounter (Signed)
error 

## 2017-08-27 NOTE — Telephone Encounter (Signed)
levothyroxine (SYNTHROID, LEVOTHROID) 75 MCG tablet   metFORMIN (GLUCOPHAGE) 1000 MG tablet     [facility] [address], [place] [address]   Patient was in office today and forgot to have the doctor send in the two prescriptions listed above]

## 2017-08-28 ENCOUNTER — Ambulatory Visit (INDEPENDENT_AMBULATORY_CARE_PROVIDER_SITE_OTHER): Payer: Medicare HMO | Admitting: Family Medicine

## 2017-08-28 ENCOUNTER — Encounter: Payer: Self-pay | Admitting: Family Medicine

## 2017-08-28 VITALS — BP 122/80 | HR 63 | Temp 98.3°F | Ht 65.0 in | Wt 204.9 lb

## 2017-08-28 DIAGNOSIS — E119 Type 2 diabetes mellitus without complications: Secondary | ICD-10-CM | POA: Diagnosis not present

## 2017-08-28 DIAGNOSIS — I10 Essential (primary) hypertension: Secondary | ICD-10-CM

## 2017-08-28 DIAGNOSIS — K219 Gastro-esophageal reflux disease without esophagitis: Secondary | ICD-10-CM | POA: Diagnosis not present

## 2017-08-28 DIAGNOSIS — H5713 Ocular pain, bilateral: Secondary | ICD-10-CM | POA: Diagnosis not present

## 2017-08-28 DIAGNOSIS — Z794 Long term (current) use of insulin: Secondary | ICD-10-CM | POA: Diagnosis not present

## 2017-08-28 MED ORDER — METOPROLOL TARTRATE 25 MG PO TABS: 25.0000 mg | ORAL_TABLET | Freq: Two times a day (BID) | ORAL | 2 refills | Status: DC

## 2017-08-28 MED ORDER — GLUCOSE BLOOD VI STRP: 1.0000 | ORAL_STRIP | Freq: Four times a day (QID) | 3 refills | Status: AC | PRN

## 2017-08-28 MED ORDER — GLUCOSE BLOOD VI STRP: 1.0000 | ORAL_STRIP | Freq: Two times a day (BID) | 3 refills | Status: DC

## 2017-08-28 MED ORDER — PANTOPRAZOLE SODIUM 40 MG PO TBEC: 40.0000 mg | DELAYED_RELEASE_TABLET | Freq: Every day | ORAL | 3 refills | Status: DC

## 2017-08-28 MED ORDER — PEN NEEDLES 32G X 5 MM MISC: 1.0000 "application " | Freq: Four times a day (QID) | 4 refills | Status: DC

## 2017-08-28 NOTE — Patient Instructions (Addendum)
Ophthalmology appointment with Dr. Hinton Lovely Tomorrow 08/29/2017 at 2 pm [address] [place], [place]

## 2017-08-28 NOTE — Progress Notes (Signed)
Subjective:    Patient ID: Terri Gray, female    DOB: [birth year], 62 y.o.   MRN: [id number]  Chief Complaint  Patient presents with  . Eye Pain    HPI Patient was seen today for ongoing concern.  Pt endorses b/l eye pain x 2 wks.  Pt denies changes in vision, but does endorse at times having a pain in her head, not really a HA as well as red eyes off and on.  Pt has a h/o dry eyes for which she uses and eye ointment, but that is not working.  Pt also has been told in the past that she had increased occular pressure.  Pt [place]hthal[place].  Past Medical History:  Diagnosis Date  . Allergy   . Arthritis   . Chicken pox   . Colon polyps   . Diabetes mellitus without complication ([place])   . GERD (gastroesophageal reflux disease)   . Hyperlipidemia   . Hypertension   . Migraines     No Known Allergies  ROS General: Denies fever, chills, night sweats, changes in weight, changes in appetite HEENT: Denies headaches, ear pain, changes in vision, rhinorrhea, sore throat   +b/l eye pain, dry eyes CV: Denies CP, palpitations, SOB, orthopnea Pulm: Denies SOB, cough, wheezing GI: Denies abdominal pain, nausea, vomiting, diarrhea, constipation GU: Denies dysuria, hematuria, frequency, vaginal discharge Msk: Denies muscle cramps, joint pains Neuro: Denies weakness, numbness, tingling Skin: Denies rashes, bruising Psych: Denies depression, anxiety, hallucinations     Objective:    Blood pressure 122/80, pulse 63, temperature 98.3 F (36.8 C), temperature source Oral, height 5' 5"  (1.651 m), weight 204 lb 14.4 oz (92.9 kg), SpO2 96 %.   Gen. Pleasant, well-nourished, in no distress, normal affect   HEENT: wearing glasses, [place]/AT, face symmetric, no scleral erythema, conjunctiva clear, PERRLA, EOMI, nares patent without drainage Lungs: no accessory muscle use, CTAB, no wheezes or rales Cardiovascular: RRR, no peripheral edema Neuro:  A&Ox3, CN II-XII  intact, normal gait   Wt Readings from Last 3 Encounters:  08/28/17 204 lb 14.4 oz (92.9 kg)  08/27/17 204 lb 6.4 oz (92.7 kg)  05/29/17 201 lb 3.2 oz (91.3 kg)    Lab Results  Component Value Date   WBC 7.0 02/28/2017   HGB 12.1 02/28/2017   HCT 37.4 02/28/2017   PLT 314.0 02/28/2017   GLUCOSE 86 02/28/2017   NA 139 02/28/2017   K 4.7 02/28/2017   CL 102 02/28/2017   CREATININE 0.81 02/28/2017   BUN 7 02/28/2017   CO2 30 02/28/2017   TSH 0.49 02/28/2017   HGBA1C 6.9 08/27/2017    Assessment/Plan:  Pain of both eyes  -discussed possible causes of eye pain including dry eyes, glaucoma, allergies, foreign body, etc -given inability to assess pt's IOP in office as no tonopen available, will place urgent referral to Ophthalmology. - Plan: Ambulatory referral to Ophthalmology  Refills given for Metoprolol 25 mg BID, Protonix 40 mg daily, insulin pen needles 32Gx28m, and test strips.  Pt also requested metformin, and levothyroxine but it appears these refills were just done today by pt's Endocrinologist.  F/u prn  SGrier Mitts MD

## 2017-08-29 DIAGNOSIS — H40013 Open angle with borderline findings, low risk, bilateral: Secondary | ICD-10-CM | POA: Diagnosis not present

## 2017-08-29 DIAGNOSIS — E119 Type 2 diabetes mellitus without complications: Secondary | ICD-10-CM | POA: Diagnosis not present

## 2017-08-29 DIAGNOSIS — H2513 Age-related nuclear cataract, bilateral: Secondary | ICD-10-CM | POA: Diagnosis not present

## 2017-08-29 DIAGNOSIS — H5713 Ocular pain, bilateral: Secondary | ICD-10-CM | POA: Diagnosis not present

## 2017-08-29 LAB — HM DIABETES EYE EXAM

## 2017-09-04 ENCOUNTER — Encounter: Payer: Self-pay | Admitting: Family Medicine

## 2017-11-12 ENCOUNTER — Encounter: Payer: Self-pay | Admitting: Endocrinology

## 2017-11-12 ENCOUNTER — Ambulatory Visit: Payer: Medicare HMO | Admitting: Endocrinology

## 2017-11-12 VITALS — BP 140/88 | HR 72 | Wt 209.4 lb

## 2017-11-12 DIAGNOSIS — E1142 Type 2 diabetes mellitus with diabetic polyneuropathy: Secondary | ICD-10-CM | POA: Diagnosis not present

## 2017-11-12 DIAGNOSIS — E049 Nontoxic goiter, unspecified: Secondary | ICD-10-CM | POA: Insufficient documentation

## 2017-11-12 DIAGNOSIS — E119 Type 2 diabetes mellitus without complications: Secondary | ICD-10-CM

## 2017-11-12 DIAGNOSIS — Z794 Long term (current) use of insulin: Secondary | ICD-10-CM

## 2017-11-12 LAB — POCT GLYCOSYLATED HEMOGLOBIN (HGB A1C): Hemoglobin A1C: 7 % — AB (ref 4.0–5.6)

## 2017-11-12 MED ORDER — INSULIN DETEMIR 100 UNIT/ML FLEXPEN: 50.0000 [IU] | PEN_INJECTOR | Freq: Two times a day (BID) | SUBCUTANEOUS | 11 refills | Status: DC

## 2017-11-12 NOTE — Patient Instructions (Addendum)
Your blood pressure is high today.  Please see your primary care provider soon, to have it rechecked.  Let's check the ultrasound.  you will receive a phone call, about a day and time for an appointment.   check your blood sugar twice a day.  vary the time of day when you check, between before the 3 meals, and at bedtime.  also check if you have symptoms of your blood sugar being too high or too low.  please keep a record of the readings and bring it to your next appointment here (or you can bring the meter itself).  You can write it on any piece of paper.  please call us sooner if your blood sugar goes below 70, or if you have a lot of readings over 200.  Please continue the same insulins.  Please come back for a follow-up appointment in 3-4 months.

## 2017-11-12 NOTE — Progress Notes (Signed)
Subjective:    Patient ID: Terri Gray, female    DOB: [birth year], 62 y.o.   MRN: [id number]  HPI Pt returns for f/u of diabetes mellitus: DM type: Insulin-requiring type 2.  Dx'ed: 2440 Complications: polyneuropathy Therapy: insulin since 2012 GDM: never DKA: never Severe hypoglycemia: never Pancreatitis: never Pancreatic imaging: never Other: she takes multiple daily injections Interval History: Pt takes meds as rx'ed.  Due to headache, she went back to levemir, 50 units bid, and novolog, 20 units 3 times a day (just before each meal).  no cbg record, but states cbg's vary from 78-190.  It is lowest in the afternoon, and highest at HS and fasting.   She last had nodular goiter checked in [place], in mid-2018.  She had bx twice (benign).   Past Medical History:  Diagnosis Date  . Allergy   . Arthritis   . Chicken pox   . Colon polyps   . Diabetes mellitus without complication ([place])   . GERD (gastroesophageal reflux disease)   . Hyperlipidemia   . Hypertension   . Migraines     Past Surgical History:  Procedure Laterality Date  . REPLACEMENT TOTAL KNEE Left 2016    Social History   Socioeconomic History  . Marital status: Married    Spouse name: Not on file  . Number of children: Not on file  . Years of education: Not on file  . Highest education level: Not on file  Occupational History  . Not on file  Social Needs  . Financial resource strain: Not on file  . Food insecurity:    Worry: Not on file    Inability: Not on file  . Transportation needs:    Medical: Not on file    Non-medical: Not on file  Tobacco Use  . Smoking status: Never Smoker  . Smokeless tobacco: Never Used  Substance and Sexual Activity  . Alcohol use: No  . Drug use: No  . Sexual activity: Not on file  Lifestyle  . Physical activity:    Days per week: Not on file    Minutes per session: Not on file  . Stress: Not on file  Relationships  . Social connections:    Talks on  phone: Not on file    Gets together: Not on file    Attends religious service: Not on file    Active member of club or organization: Not on file    Attends meetings of clubs or organizations: Not on file    Relationship status: Not on file  . Intimate partner violence:    Fear of current or ex partner: Not on file    Emotionally abused: Not on file    Physically abused: Not on file    Forced sexual activity: Not on file  Other Topics Concern  . Not on file  Social History Narrative  . Not on file    Current Outpatient Medications on File Prior to Visit  Medication Sig Dispense Refill  . cyclobenzaprine (FLEXERIL) 10 MG tablet Take 1 tablet (10 mg total) by mouth 3 (three) times daily as needed for muscle spasms. 90 tablet 5  . glucose blood (TRUE METRIX BLOOD GLUCOSE TEST) test strip 1 each by Other route 4 (four) times daily as needed for other. And lancets 2/day 360 each 3  . insulin aspart (NOVOLOG FLEXPEN) 100 UNIT/ML FlexPen Inject 20 Units into the skin 3 (three) times daily with meals. And pen needles 4/day 30 mL 11  .  Insulin Pen Needle (PEN NEEDLES) 32G X 5 MM MISC 1 application by Does not apply route 4 (four) times daily. 360 each 4  . Insulin Pen Needle 32G X 5 MM MISC USE 1 PEN NEEDLE 4 TIMES DAILY 300 each 2  . levothyroxine (SYNTHROID, LEVOTHROID) 75 MCG tablet Take 1 tablet (75 mcg total) by mouth daily before breakfast. 90 tablet 2  . mesalamine (PENTASA) 500 MG CR capsule Take 1 capsule (500 mg total) by mouth 4 (four) times daily. 112 capsule 0  . metFORMIN (GLUCOPHAGE) 1000 MG tablet Take 1 tablet (1,000 mg total) by mouth 2 (two) times daily with a meal. 180 tablet 1  . metoprolol tartrate (LOPRESSOR) 25 MG tablet Take 1 tablet (25 mg total) by mouth 2 (two) times daily. 180 tablet 2  . pantoprazole (PROTONIX) 40 MG tablet Take 1 tablet (40 mg total) by mouth daily. 90 tablet 3  . simvastatin (ZOCOR) 40 MG tablet Take 1 tablet (40 mg total) by mouth daily. 90 tablet  3   No current facility-administered medications on file prior to visit.     No Known Allergies  Family History  Problem Relation Age of Onset  . Diabetes Father     BP 140/88 (BP Location: Left Arm, Patient Position: Sitting, Cuff Size: Normal)   Pulse 72   Wt 209 lb 6.4 oz (95 kg)   SpO2 98%   BMI 34.85 kg/m    Review of Systems She denies hypoglycemia    Objective:   Physical Exam VITAL SIGNS:  See vs page GENERAL: no distress NECK: thyroid has irreg surface, but I can't tell details.   Pulses: dorsalis pedis intact bilat.   MSK: no deformity of the feet CV: no leg edema Skin:  no ulcer on the feet.  normal color and temp on the feet. Neuro: sensation is intact to touch on the feet   Lab Results  Component Value Date   TSH 0.49 02/28/2017   Lab Results  Component Value Date   HGBA1C 7.0 (A) 11/12/2017       Assessment & Plan:  Multinodular goiter, new to me: recheck today: we'll start following here.  HTN: is noted today Insulin-requiring type 2 DM: well-controlled  Patient Instructions  Your blood pressure is high today.  Please see your primary care provider soon, to have it rechecked.  Let's check the ultrasound.  you will receive a phone call, about a day and time for an appointment.   check your blood sugar twice a day.  vary the time of day when you check, between before the 3 meals, and at bedtime.  also check if you have symptoms of your blood sugar being too high or too low.  please keep a record of the readings and bring it to your next appointment here (or you can bring the meter itself).  You can write it on any piece of paper.  please call us sooner if your blood sugar goes below 70, or if you have a lot of readings over 200.  Please continue the same insulins.  Please come back for a follow-up appointment in 3-4 months.

## 2017-11-16 ENCOUNTER — Telehealth: Payer: Self-pay | Admitting: Endocrinology

## 2017-11-16 ENCOUNTER — Other Ambulatory Visit: Payer: Self-pay

## 2017-11-16 DIAGNOSIS — E119 Type 2 diabetes mellitus without complications: Secondary | ICD-10-CM

## 2017-11-16 DIAGNOSIS — Z794 Long term (current) use of insulin: Principal | ICD-10-CM

## 2017-11-16 MED ORDER — INSULIN REGULAR HUMAN 100 UNIT/ML IJ SOLN: INTRAMUSCULAR | 11 refills | Status: DC

## 2017-11-16 MED ORDER — "INSULIN SYRINGE-NEEDLE U-100 31G X 5/16"" 1 ML MISC": 99 refills | Status: DC

## 2017-11-16 MED ORDER — INSULIN DETEMIR 100 UNIT/ML FLEXPEN
50.0000 [IU] | PEN_INJECTOR | Freq: Two times a day (BID) | SUBCUTANEOUS | 11 refills | Status: DC
Start: 2017-11-16 — End: 2018-03-14

## 2017-11-16 MED ORDER — INSULIN NPH (HUMAN) (ISOPHANE) 100 UNIT/ML ~~LOC~~ SUSP: SUBCUTANEOUS | 11 refills | Status: DC

## 2017-11-16 NOTE — Telephone Encounter (Signed)
Patient want to know is insulin Novolin suitable for her, if so send a prescription to,  [place], [place] - [place] [address]

## 2017-11-16 NOTE — Telephone Encounter (Signed)
I have sent for 90 day supply.

## 2017-11-16 NOTE — Telephone Encounter (Signed)
Insulin Detemir (LEVEMIR FLEXPEN) 100 UNIT/ML Pen  insulin aspart (NOVOLOG FLEXPEN) 100 UNIT/ML FlexPen   Prescriptions were sent in for a 30 day supply and patient would like to know if a  90 day supply can be sent in     [place], [place] - [place] [address]

## 2017-11-16 NOTE — Telephone Encounter (Signed)
Please advise 

## 2017-11-16 NOTE — Telephone Encounter (Signed)
I have spoken to patient & sent prescriptions to [facility] for her.

## 2017-11-16 NOTE — Telephone Encounter (Signed)
For novolin: Change novolog to novolin reg, at same units Change levemir to novolin NPH, 30 units qhs. Please call or message [place] next week, to tell us how the blood sugar is doing

## 2017-11-20 ENCOUNTER — Telehealth: Payer: Self-pay | Admitting: Endocrinology

## 2017-11-20 ENCOUNTER — Other Ambulatory Visit: Payer: Self-pay

## 2017-11-20 MED ORDER — INSULIN NPH (HUMAN) (ISOPHANE) 100 UNIT/ML ~~LOC~~ SUSP: SUBCUTANEOUS | 11 refills | Status: DC

## 2017-11-20 MED ORDER — INSULIN REGULAR HUMAN 100 UNIT/ML IJ SOLN: INTRAMUSCULAR | 11 refills | Status: DC

## 2017-11-20 NOTE — Telephone Encounter (Signed)
Patient thinks the wrong amount of insulin was prescribed. They only gave her 2 vials (1 regular/1 long lasting). This will not last. Please call patient at ph# [phone] if there are questions. Pharmacy [facility] [address].

## 2017-11-20 NOTE — Telephone Encounter (Signed)
I have sent patient in two vials of Relion N & R.

## 2017-11-26 ENCOUNTER — Other Ambulatory Visit: Payer: Medicare HMO

## 2017-11-28 DIAGNOSIS — H5713 Ocular pain, bilateral: Secondary | ICD-10-CM | POA: Diagnosis not present

## 2017-11-28 DIAGNOSIS — H40013 Open angle with borderline findings, low risk, bilateral: Secondary | ICD-10-CM | POA: Diagnosis not present

## 2017-11-28 DIAGNOSIS — H04123 Dry eye syndrome of bilateral lacrimal glands: Secondary | ICD-10-CM | POA: Diagnosis not present

## 2017-11-28 LAB — HM DIABETES EYE EXAM

## 2017-12-04 ENCOUNTER — Other Ambulatory Visit: Payer: Medicare HMO

## 2017-12-07 ENCOUNTER — Ambulatory Visit
Admission: RE | Admit: 2017-12-07 | Discharge: 2017-12-07 | Disposition: A | Discharge status: Home / Self Care | Payer: Medicare HMO | Source: Ambulatory Visit | Attending: Endocrinology | Admitting: Endocrinology

## 2017-12-07 DIAGNOSIS — E041 Nontoxic single thyroid nodule: Secondary | ICD-10-CM | POA: Diagnosis not present

## 2017-12-07 DIAGNOSIS — E049 Nontoxic goiter, unspecified: Secondary | ICD-10-CM

## 2018-01-11 ENCOUNTER — Telehealth: Payer: Self-pay | Admitting: Family Medicine

## 2018-01-11 DIAGNOSIS — E785 Hyperlipidemia, unspecified: Secondary | ICD-10-CM

## 2018-01-11 MED ORDER — SIMVASTATIN 40 MG PO TABS: 40.0000 mg | ORAL_TABLET | Freq: Every day | ORAL | 0 refills | Status: DC

## 2018-01-11 NOTE — Telephone Encounter (Signed)
Copied from [place] [id number]. Topic: Quick Communication - Rx Refill/Question >> Jan 11, 2018 10:55 AM Margot Ables wrote: Medication: simvastatin - pt out - new RX needed as pt regular pharmacy has temporarily shut down and the RX cannot transfer Has the patient contacted their pharmacy? yes Preferred Pharmacy (with phone number or street name): [place], [place] [phone] (Phone) [phone] (Fax)

## 2018-02-19 DIAGNOSIS — H179 Unspecified corneal scar and opacity: Secondary | ICD-10-CM | POA: Diagnosis not present

## 2018-02-19 DIAGNOSIS — H04123 Dry eye syndrome of bilateral lacrimal glands: Secondary | ICD-10-CM | POA: Diagnosis not present

## 2018-02-19 DIAGNOSIS — H02403 Unspecified ptosis of bilateral eyelids: Secondary | ICD-10-CM | POA: Diagnosis not present

## 2018-02-19 DIAGNOSIS — H5713 Ocular pain, bilateral: Secondary | ICD-10-CM | POA: Diagnosis not present

## 2018-02-20 ENCOUNTER — Other Ambulatory Visit: Payer: Self-pay

## 2018-02-20 ENCOUNTER — Encounter: Payer: Self-pay | Admitting: Family Medicine

## 2018-02-20 ENCOUNTER — Ambulatory Visit (INDEPENDENT_AMBULATORY_CARE_PROVIDER_SITE_OTHER): Payer: Medicare HMO | Admitting: Family Medicine

## 2018-02-20 VITALS — BP 128/80 | HR 64 | Temp 98.0°F | Wt 218.0 lb

## 2018-02-20 DIAGNOSIS — K449 Diaphragmatic hernia without obstruction or gangrene: Secondary | ICD-10-CM | POA: Diagnosis not present

## 2018-02-20 DIAGNOSIS — R682 Dry mouth, unspecified: Secondary | ICD-10-CM | POA: Diagnosis not present

## 2018-02-20 DIAGNOSIS — Z96652 Presence of left artificial knee joint: Secondary | ICD-10-CM

## 2018-02-20 DIAGNOSIS — H02401 Unspecified ptosis of right eyelid: Secondary | ICD-10-CM

## 2018-02-20 DIAGNOSIS — E119 Type 2 diabetes mellitus without complications: Secondary | ICD-10-CM

## 2018-02-20 DIAGNOSIS — E039 Hypothyroidism, unspecified: Secondary | ICD-10-CM

## 2018-02-20 DIAGNOSIS — K219 Gastro-esophageal reflux disease without esophagitis: Secondary | ICD-10-CM

## 2018-02-20 DIAGNOSIS — M25561 Pain in right knee: Secondary | ICD-10-CM

## 2018-02-20 DIAGNOSIS — M542 Cervicalgia: Secondary | ICD-10-CM | POA: Diagnosis not present

## 2018-02-20 DIAGNOSIS — R002 Palpitations: Secondary | ICD-10-CM

## 2018-02-20 DIAGNOSIS — I10 Essential (primary) hypertension: Secondary | ICD-10-CM

## 2018-02-20 DIAGNOSIS — K5190 Ulcerative colitis, unspecified, without complications: Secondary | ICD-10-CM

## 2018-02-20 DIAGNOSIS — Z23 Encounter for immunization: Secondary | ICD-10-CM | POA: Diagnosis not present

## 2018-02-20 DIAGNOSIS — E785 Hyperlipidemia, unspecified: Secondary | ICD-10-CM

## 2018-02-20 DIAGNOSIS — G5603 Carpal tunnel syndrome, bilateral upper limbs: Secondary | ICD-10-CM | POA: Diagnosis not present

## 2018-02-20 DIAGNOSIS — G8929 Other chronic pain: Secondary | ICD-10-CM

## 2018-02-20 DIAGNOSIS — H04129 Dry eye syndrome of unspecified lacrimal gland: Secondary | ICD-10-CM | POA: Diagnosis not present

## 2018-02-20 DIAGNOSIS — M26609 Unspecified temporomandibular joint disorder, unspecified side: Secondary | ICD-10-CM

## 2018-02-20 DIAGNOSIS — Z794 Long term (current) use of insulin: Secondary | ICD-10-CM

## 2018-02-20 LAB — BASIC METABOLIC PANEL
BUN: 10 mg/dL (ref 6–23)
CALCIUM: 9.2 mg/dL (ref 8.4–10.5)
CO2: 28 mEq/L (ref 19–32)
Chloride: 103 mEq/L (ref 96–112)
Creatinine, Ser: 0.84 mg/dL (ref 0.40–1.20)
GFR: 88.08 mL/min (ref >60.00)
GLUCOSE: 102 mg/dL — AB (ref 70–99)
Potassium: 4.9 mEq/L (ref 3.5–5.1)
SODIUM: 140 meq/L (ref 135–145)

## 2018-02-20 LAB — TSH: TSH: 0.85 u[IU]/mL (ref 0.35–4.50)

## 2018-02-20 MED ORDER — METFORMIN HCL 1000 MG PO TABS: 1000.0000 mg | ORAL_TABLET | Freq: Two times a day (BID) | ORAL | 1 refills | Status: DC

## 2018-02-20 MED ORDER — SIMVASTATIN 40 MG PO TABS: 40.0000 mg | ORAL_TABLET | Freq: Every day | ORAL | 0 refills | Status: DC

## 2018-02-20 MED ORDER — METOPROLOL TARTRATE 25 MG PO TABS: 25.0000 mg | ORAL_TABLET | Freq: Two times a day (BID) | ORAL | 2 refills | Status: DC

## 2018-02-20 MED ORDER — LEVOTHYROXINE SODIUM 75 MCG PO TABS: 75.0000 ug | ORAL_TABLET | Freq: Every day | ORAL | 2 refills | Status: DC

## 2018-02-20 MED ORDER — PANTOPRAZOLE SODIUM 40 MG PO TBEC: 40.0000 mg | DELAYED_RELEASE_TABLET | Freq: Every day | ORAL | 3 refills | Status: DC

## 2018-02-20 MED ORDER — PREDNISONE 10 MG PO TABS: ORAL_TABLET | ORAL | 0 refills | Status: DC

## 2018-02-20 MED ORDER — MESALAMINE ER 500 MG PO CPCR: 500.0000 mg | ORAL_CAPSULE | Freq: Four times a day (QID) | ORAL | 0 refills | Status: DC

## 2018-02-20 MED ORDER — CYCLOBENZAPRINE HCL 10 MG PO TABS: 10.0000 mg | ORAL_TABLET | Freq: Three times a day (TID) | ORAL | 5 refills | Status: DC | PRN

## 2018-02-20 NOTE — Patient Instructions (Addendum)
Neck Exercises Neck exercises can be important for many reasons:  They can help you to improve and maintain flexibility in your neck. This can be especially important as you age.  They can help to make your neck stronger. This can make movement easier.  They can reduce or prevent neck pain.  They may help your upper back.  Ask your health care provider which neck exercises would be best for you. Exercises Neck Press Repeat this exercise 10 times. Do it first thing in the morning and right before bed or as told by your health care provider. 1. Lie on your back on a firm bed or on the floor with a pillow under your head. 2. Use your neck muscles to push your head down on the pillow and straighten your spine. 3. Hold the position as well as you can. Keep your head facing up and your chin tucked. 4. Slowly count to 5 while holding this position. 5. Relax for a few seconds. Then repeat.  Isometric Strengthening Do a full set of these exercises 2 times a day or as told by your health care provider. 1. Sit in a supportive chair and place your hand on your forehead. 2. Push forward with your head and neck while pushing back with your hand. Hold for 10 seconds. 3. Relax. Then repeat the exercise 3 times. 4. Next, do thesequence again, this time putting your hand against the back of your head. Use your head and neck to push backward against the hand pressure. 5. Finally, do the same exercise on either side of your head, pushing sideways against the pressure of your hand.  Prone Head Lifts Repeat this exercise 5 times. Do this 2 times a day or as told by your health care provider. 1. Lie face-down, resting on your elbows so that your chest and upper back are raised. 2. Start with your head facing downward, near your chest. Position your chin either on or near your chest. 3. Slowly lift your head upward. Lift until you are looking straight ahead. Then continue lifting your head as far back as  you can stretch. 4. Hold your head up for 5 seconds. Then slowly lower it to your starting position.  Supine Head Lifts Repeat this exercise 8-10 times. Do this 2 times a day or as told by your health care provider. 1. Lie on your back, bending your knees to point to the ceiling and keeping your feet flat on the floor. 2. Lift your head slowly off the floor, raising your chin toward your chest. 3. Hold for 5 seconds. 4. Relax and repeat.  Scapular Retraction Repeat this exercise 5 times. Do this 2 times a day or as told by your health care provider. 1. Stand with your arms at your sides. Look straight ahead. 2. Slowly pull both shoulders backward and downward until you feel a stretch between your shoulder blades in your upper back. 3. Hold for 10-30 seconds. 4. Relax and repeat.  Contact a health care provider if:  Your neck pain or discomfort gets much worse when you do an exercise.  Your neck pain or discomfort does not improve within 2 hours after you exercise. If you have any of these problems, stop exercising right away. Do not do the exercises again unless your health care provider says that you can. Get help right away if:  You develop sudden, severe neck pain. If this happens, stop exercising right away. Do not do the exercises again unless your  health care provider says that you can. Exercises Neck Stretch  Repeat this exercise 3-5 times. 1. Do this exercise while standing or while sitting in a chair. 2. Place your feet flat on the floor, shoulder-width apart. 3. Slowly turn your head to the right. Turn it all the way to the right so you can look over your right shoulder. Do not tilt or tip your head. 4. Hold this position for 10-30 seconds. 5. Slowly turn your head to the left, to look over your left shoulder. 6. Hold this position for 10-30 seconds.  Carpal Tunnel Syndrome Carpal tunnel syndrome is a condition that causes pain in your hand and arm. The carpal tunnel  is a narrow area located on the palm side of your wrist. Repeated wrist motion or certain diseases may cause swelling within the tunnel. This swelling pinches the main nerve in the wrist (median nerve). What are the causes? This condition may be caused by:  Repeated wrist motions.  Wrist injuries.  Arthritis.  A cyst or tumor in the carpal tunnel.  Fluid buildup during pregnancy.  Sometimes the cause of this condition is not known. What increases the risk? This condition is more likely to develop in:  People who have jobs that cause them to repeatedly move their wrists in the same motion, such as Art gallery manager.  Women.  People with certain conditions, such as: ? Diabetes. ? Obesity. ? An underactive thyroid (hypothyroidism). ? Kidney failure.  What are the signs or symptoms? Symptoms of this condition include:  A tingling feeling in your fingers, especially in your thumb, index, and middle fingers.  Tingling or numbness in your hand.  An aching feeling in your entire arm, especially when your wrist and elbow are bent for long periods of time.  Wrist pain that goes up your arm to your shoulder.  Pain that goes down into your palm or fingers.  A weak feeling in your hands. You may have trouble grabbing and holding items.  Your symptoms may feel worse during the night. How is this diagnosed? This condition is diagnosed with a medical history and physical exam. You may also have tests, including:  An electromyogram (EMG). This test measures electrical signals sent by your nerves into the muscles.  X-rays.  How is this treated? Treatment for this condition includes:  Lifestyle changes. It is important to stop doing or modify the activity that caused your condition.  Physical or occupational therapy.  Medicines for pain and inflammation. This may include medicine that is injected into your wrist.  A wrist splint.  Surgery.  Follow these instructions  at home: If you have a splint:  Wear it as told by your health care provider. Remove it only as told by your health care provider.  Loosen the splint if your fingers become numb and tingle, or if they turn cold and blue.  Keep the splint clean and dry. General instructions  Take over-the-counter and prescription medicines only as told by your health care provider.  Rest your wrist from any activity that may be causing your pain. If your condition is work related, talk to your employer about changes that can be made, such as getting a wrist pad to use while typing.  If directed, apply ice to the painful area: ? Put ice in a plastic bag. ? Place a towel between your skin and the bag. ? Leave the ice on for 20 minutes, 2-3 times per day.  Keep all follow-up visits  as told by your health care provider. This is important.  Do any exercises as told by your health care provider, physical therapist, or occupational therapist. Contact a health care provider if:  You have new symptoms.  Your pain is not controlled with medicines.  Your symptoms get worse. This information is not intended to replace advice given to you by your health care provider. Make sure you discuss any questions you have with your health care provider. Document Released: 05/05/2000 Document Revised: 09/16/2015 Document Reviewed: 09/23/2014 Elsevier Interactive Patient Education  2018 [employer].  Hiatal Hernia A hiatal hernia occurs when part of the stomach slides above the muscle that separates the abdomen from the chest (diaphragm). A person can be born with a hiatal hernia (congenital), or it may develop over time. In almost all cases of hiatal hernia, only the top part of the stomach pushes through the diaphragm. Many people have a hiatal hernia with no symptoms. The larger the hernia, the more likely it is that you will have symptoms. In some cases, a hiatal hernia allows stomach acid to flow back into the tube  that carries food from your mouth to your stomach (esophagus). This may cause heartburn symptoms. Severe heartburn symptoms may mean that you have developed a condition called gastroesophageal reflux disease (GERD). What are the causes? This condition is caused by a weakness in the opening (hiatus) where the esophagus passes through the diaphragm to attach to the upper part of the stomach. A person may be born with a weakness in the hiatus, or a weakness can develop over time. What increases the risk? This condition is more likely to develop in:  Older people. Age is a major risk factor for a hiatal hernia, especially if you are over the age of 6.  Pregnant women.  People who are overweight.  People who have frequent constipation.  What are the signs or symptoms? Symptoms of this condition usually develop in the form of GERD symptoms. Symptoms include:  Heartburn.  Belching.  Indigestion.  Trouble swallowing.  Coughing or wheezing.  Sore throat.  Hoarseness.  Chest pain.  Nausea and vomiting.  How is this diagnosed? This condition may be diagnosed during testing for GERD. Tests that may be done include:  X-rays of your stomach or chest.  An upper gastrointestinal (GI) series. This is an X-ray exam of your GI tract that is taken after you swallow a chalky liquid that shows up clearly on the X-ray.  Endoscopy. This is a procedure to look into your stomach using a thin, flexible tube that has a tiny camera and light on the end of it.  How is this treated? This condition may be treated by:  Dietary and lifestyle changes to help reduce GERD symptoms.  Medicines. These may include: ? Over-the-counter antacids. ? Medicines that make your stomach empty more quickly. ? Medicines that block the production of stomach acid (H2 blockers). ? Stronger medicines to reduce stomach acid (proton pump inhibitors).  Surgery to repair the hernia, if other treatments are not  helping.  If you have no symptoms, you may not need treatment. Follow these instructions at home: Lifestyle and activity  Do not use any products that contain nicotine or tobacco, such as cigarettes and e-cigarettes. If you need help quitting, ask your health care provider.  Try to achieve and maintain a healthy body weight.  Avoid putting pressure on your abdomen. Anything that puts pressure on your abdomen increases the amount of acid that  may be pushed up into your esophagus. ? Avoid bending over, especially after eating. ? Raise the head of your bed by putting blocks under the legs. This keeps your head and esophagus higher than your stomach. ? Do not wear tight clothing around your chest or stomach. ? Try not to strain when having a bowel movement, when urinating, or when lifting heavy objects. Eating and drinking  Avoid foods that can worsen GERD symptoms. These may include: ? Fatty foods, like fried foods. ? Citrus fruits, like oranges or lemon. ? Other foods and drinks that contain acid, like orange juice or tomatoes. ? Spicy food. ? Chocolate.  Eat frequent small meals instead of three large meals a day. This helps prevent your stomach from getting too full. ? Eat slowly. ? Do not lie down right after eating. ? Do not eat 1-2 hours before bed.  Do not drink beverages with caffeine. These include cola, coffee, cocoa, and tea.  Do not drink alcohol. General instructions  Take over-the-counter and prescription medicines only as told by your health care provider.  Keep all follow-up visits as told by your health care provider. This is important. Contact a health care provider if:  Your symptoms are not controlled with medicines or lifestyle changes.  You are having trouble swallowing.  You have coughing or wheezing that will not go away. Get help right away if:  Your pain is getting worse.  Your pain spreads to your arms, neck, jaw, teeth, or back.  You have  shortness of breath.  You sweat for no reason.  You feel sick to your stomach (nauseous) or you vomit.  You vomit blood.  You have bright red blood in your stools.  You have black, tarry stools. This information is not intended to replace advice given to you by your health care provider. Make sure you discuss any questions you have with your health care provider. Document Released: 07/29/2003 Document Revised: 05/01/2016 Document Reviewed: 05/01/2016 Elsevier Interactive Patient Education  [employer].

## 2018-02-20 NOTE — Progress Notes (Signed)
Subjective:    Patient ID: Terri Gray, female    DOB: [birth year], 62 y.o.   MRN: [id number]  No chief complaint on file.   HPI Patient was seen today for ongoing concern.  Patient endorses history of dry eyes, dry mouth, and drooping eyelid. Seen by [facility], who request autoimmune labs.  Pt endorses joint pain in bilateral knees, shoulders, neck and wrist.  Pa has a history of bilateral carpal tunnel syndrome.  She wears wrist splints at night.  Has noticed increased weakness in hands when gripping objects, R>L.  In the a.m. patient's right middle and index fingers will get stuck/locked.  Also notes numbness, tingling, burning in fingers.  Patient also notes history of pinched nerve in neck several years ago.  H/o hiatal hernia.  Patient feels it is getting worse.  Causing constipation/diarrhea and abdominal cramping.  Patient also mentions she had to switch her insulin regimen as the cost became an issue.  Now using Novolin.  Past Medical History:  Diagnosis Date  . Allergy   . Arthritis   . Chicken pox   . Colon polyps   . Diabetes mellitus without complication ([place])   . GERD (gastroesophageal reflux disease)   . Hyperlipidemia   . Hypertension   . Migraines     No Known Allergies  ROS General: Denies fever, chills, night sweats, changes in weight, changes in appetite HEENT: Denies headaches, ear pain, changes in vision, rhinorrhea, sore throat + dry eyes, dry mouth CV: Denies CP, SOB, orthopnea  + palpitations Pulm: Denies SOB, cough, wheezing GI: Denies abdominal pain, nausea, vomiting, diarrhea, constipation  +h/o hiatal hernia GU: Denies dysuria, hematuria, frequency, vaginal discharge Msk: Denies muscle cramps +joint pains, drooping eyelid Neuro: Denies weakness  + burning, numbness, tingling in hands Skin: Denies rashes, bruising Psych: Denies depression, anxiety, hallucinations     Objective:    Blood pressure 128/80, pulse 64,  temperature 98 F (36.7 C), temperature source Oral, weight 218 lb (98.9 kg), SpO2 98 %.   Gen. Pleasant, well-nourished, in no distress, normal affect   HEENT: No ptosis present, [place]/AT, face symmetric, no scleral icterus, PERRLA, nares patent without drainage Lungs: no accessory muscle use, CTAB, no wheezes or rales Cardiovascular: RRR, no m/r/g, no peripheral edema Abdomen: BS present, soft, NT/ND, no hepatosplenomegaly. Musculoskeletal: Well-healed midline incision of left knee s/p TKR.  Positive Tinel and Phalen's bilateral wrist.  TTP of b/l medial epicondyl, decreased grip strength bilaterally.  No deformities, no cyanosis or clubbing, normal tone Neuro:  A&Ox3, CN II-XII intact, normal gait Skin:  Warm, no lesions/ rash   Wt Readings from Last 3 Encounters:  02/20/18 218 lb (98.9 kg)  11/12/17 209 lb 6.4 oz (95 kg)  08/28/17 204 lb 14.4 oz (92.9 kg)    Lab Results  Component Value Date   WBC 7.0 02/28/2017   HGB 12.1 02/28/2017   HCT 37.4 02/28/2017   PLT 314.0 02/28/2017   GLUCOSE 86 02/28/2017   NA 139 02/28/2017   K 4.7 02/28/2017   CL 102 02/28/2017   CREATININE 0.81 02/28/2017   BUN 7 02/28/2017   CO2 30 02/28/2017   TSH 0.49 02/28/2017   HGBA1C 7.0 (A) 11/12/2017    Assessment/Plan:  Palpitations  - Plan: Basic metabolic panel, TSH  Dry eye  - Plan: ANA, Rheumatoid Factor, Sjogrens syndrome-A extractable nuclear antibody, Sjogrens syndrome-B extractable nuclear antibody  Dry mouth  - Plan: ANA, Rheumatoid Factor, Sjogrens syndrome-A extractable nuclear antibody, Sjogrens syndrome-B  extractable nuclear antibody  Bilateral carpal tunnel syndrome  - Plan: Ambulatory referral to Orthopedic Surgery, predniSONE (DELTASONE) 10 MG tablet  Neck pain  - Plan: Ambulatory referral to Orthopedic Surgery, predniSONE (DELTASONE) 10 MG tablet  Hiatal hernia - Plan: Ambulatory referral to Gastroenterology  Chronic pain of right knee  - Plan: Ambulatory referral to  Orthopedic Surgery, predniSONE (DELTASONE) 10 MG tablet  History of total left knee replacement (TKR)  - Plan: Ambulatory referral to Orthopedic Surgery  Ptosis of right eyelid  - Plan: Acetylcholine Receptor Ab, All  Need for immunization against influenza  - Plan: Flu Vaccine QUAD 36+ mos IM  Patient advised use of prednisone will likely increase her blood sugar.  Pt will likely need to increase the dose of her insulin during and after prednisone use.  Follow-up PRN  [name], MD

## 2018-02-21 LAB — RHEUMATOID FACTOR: Rhuematoid fact SerPl-aCnc: <14 IU/mL (ref <14)

## 2018-02-21 LAB — ANA: Anti Nuclear Antibody(ANA): NEGATIVE

## 2018-02-21 LAB — SJOGRENS SYNDROME-B EXTRACTABLE NUCLEAR ANTIBODY: SSB (La) (ENA) Antibody, IgG: <1 AI

## 2018-02-21 LAB — SJOGRENS SYNDROME-A EXTRACTABLE NUCLEAR ANTIBODY: SSA (RO) (ENA) ANTIBODY, IGG: NEGATIVE AI

## 2018-02-28 ENCOUNTER — Ambulatory Visit (INDEPENDENT_AMBULATORY_CARE_PROVIDER_SITE_OTHER): Payer: Medicare HMO | Admitting: Orthopaedic Surgery

## 2018-02-28 ENCOUNTER — Encounter (INDEPENDENT_AMBULATORY_CARE_PROVIDER_SITE_OTHER): Payer: Self-pay | Admitting: Orthopaedic Surgery

## 2018-02-28 ENCOUNTER — Ambulatory Visit (INDEPENDENT_AMBULATORY_CARE_PROVIDER_SITE_OTHER): Payer: Medicare HMO

## 2018-02-28 DIAGNOSIS — M1711 Unilateral primary osteoarthritis, right knee: Secondary | ICD-10-CM | POA: Diagnosis not present

## 2018-02-28 DIAGNOSIS — M5412 Radiculopathy, cervical region: Secondary | ICD-10-CM

## 2018-02-28 LAB — ACETYLCHOLINE RECEPTOR AB, ALL
AChR Binding Ab, Serum: 0.08 nmol/L (ref 0.00–0.24)
Acetylchol Block Ab: 14 % (ref 0–25)
Acetylcholine Modulat Ab: <12 % (ref 0–20)

## 2018-02-28 MED ORDER — METHYLPREDNISOLONE 4 MG PO TBPK: ORAL_TABLET | ORAL | 0 refills | Status: DC

## 2018-02-28 NOTE — Progress Notes (Signed)
Office Visit Note   Patient: Terri Gray           Date of Birth: 10/02/1955           MRN: [id number] Visit Date: 02/28/2018              Requested by: [name], MD [place], [place] [address] PCP: [name], MD   Assessment & Plan: Visit Diagnoses:  1. Localized osteoarthritis of right knee   2. Radiculopathy of cervical spine     Plan: Impression is right knee osteoarthritis and cervical spine radiculopathy.  Because she is a diabetic and her hands are the most bothersome today, we will call in a methylprednisolone pack to start taking.  She is already taking Flexeril for her TMJ so we will not start her on a new muscle relaxer.  We will send her to formal physical therapy for McKenzie stretches and traction as well as other modalities.  If her knee pain continues, she will follow back up with [place] for a knee injection.  Follow-Up Instructions: Return if symptoms worsen or fail to improve.   Orders:  Orders Placed This Encounter  Procedures  . XR KNEE 3 VIEW RIGHT  . XR Cervical Spine 2 or 3 views   Meds ordered this encounter  Medications  . methylPREDNISolone (MEDROL DOSEPAK) 4 MG TBPK tablet    Sig: Take as directed    Dispense:  21 tablet    Refill:  0      Procedures: No procedures performed   Clinical Data: No additional findings.   Subjective: Chief Complaint  Patient presents with  . Left Knee - Pain  . Right Knee - Pain  . Neck - Pain    HPI patient is a pleasant 62 year old female who presents to our clinic today with bilateral hand pain and numbness as well as right knee pain.  In regards to both hands the right is worse than the left.  This is been ongoing for many years.  She does note decreased sensation to the long, ring and small fingers.  She does have a history of carpal tunnel syndrome.  No weakness.  This pain and numbness appears to be worse with movement of the wrist.  She does have a history of  moderate neck pain.  She notes frequent " crunching "of the neck.  She has pain with flexion and extension.  No history of epidural steroid injections to the neck, but she does have a history of these in her lumbar spine.  She has tried over-the-counter medications without relief of symptoms.  In regards to her right knee, she has had pain here for the past several years.  It was initially intermittent but has now become more constant.  The pain she has is to the entire aspect worse with walking and going up and down stairs.  No previous cortisone injection.  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  Doing well there.  Review of Systems as detailed in HPI.  All others reviewed and are negative.   Objective: Vital Signs: There were no vitals taken for this visit.  Physical Exam well-developed well-nourished female in no acute distress.  Alert and oriented x3.  Ortho Exam examination of both hands reveals positive Phalen, positive Tinel at the wrist and elbow.  No thenar atrophy.  Increased pain with wrist flexion.  Examination of the right knee shows  range of motion from 0 to 125 degrees.  Moderate patellofemoral crepitus.  Moderate medial joint line tenderness.  Ligaments are stable.  She is neurovascularly intact distally.  Specialty Comments:  No specialty comments available.  Imaging: Xr Cervical Spine 2 Or 3 Views  Result Date: 02/28/2018 Significant degenerative disc disease with anterior spurring as well as straightening of the cervical spine  Xr Knee 3 View Right  Result Date: 02/28/2018 X-rays of her right knee reveal moderate medial and lateral joint space narrowing with significant narrowing to the patellofemoral compartment    PMFS History: Patient Active Problem List   Diagnosis Date Noted  . Localized osteoarthritis of right knee 02/28/2018  . Radiculopathy of cervical spine 02/28/2018  . Goiter 11/12/2017  . Diabetes  ([place]) 05/30/2017   Past Medical History:  Diagnosis Date  . Allergy   . Arthritis   . Chicken pox   . Colon polyps   . Diabetes mellitus without complication ([place])   . GERD (gastroesophageal reflux disease)   . Hyperlipidemia   . Hypertension   . Migraines     Family History  Problem Relation Age of Onset  . Diabetes Father     Past Surgical History:  Procedure Laterality Date  . REPLACEMENT TOTAL KNEE Left 2016   Social History   Occupational History  . Not on file  Tobacco Use  . Smoking status: Never Smoker  . Smokeless tobacco: Never Used  Substance and Sexual Activity  . Alcohol use: No  . Drug use: No  . Sexual activity: Not on file

## 2018-03-01 ENCOUNTER — Telehealth: Payer: Self-pay | Admitting: Family Medicine

## 2018-03-01 ENCOUNTER — Ambulatory Visit (INDEPENDENT_AMBULATORY_CARE_PROVIDER_SITE_OTHER): Payer: Medicare HMO

## 2018-03-01 DIAGNOSIS — Z23 Encounter for immunization: Secondary | ICD-10-CM | POA: Diagnosis not present

## 2018-03-01 NOTE — Telephone Encounter (Signed)
The patient dropped off a handicap placard  Call patient to pick up form at: [phone]  Disposition: Dr's Folder

## 2018-03-01 NOTE — Progress Notes (Signed)
Per orders of Dr. Volanda Napoleon, injection of Prevnar 13 given by Franco Collet. Patient tolerated injection well.  Pt is immunocompromised (Asplenic) No vaccinations was on file. Pt stated that she moved from [place] and was given the Pneumonia vaccination every 2 years and think that her last dose was Pneumovax 23 but wasn't sure. Pt was given Prevnar 13 and was told to get her Pneumovax 23 at least 8 weeks after today visit.

## 2018-03-04 ENCOUNTER — Ambulatory Visit: Payer: Medicare HMO

## 2018-03-05 NOTE — Telephone Encounter (Signed)
Placard has been received and has been forwarded to Dr Volanda Napoleon for completion

## 2018-03-11 NOTE — Telephone Encounter (Signed)
Spoke with pt voiced that her Placard is complete and ready for pick up, pt state that she will be here tomorrow 03/12/2018 to pick it up. Form is waiting at the front office

## 2018-03-14 ENCOUNTER — Encounter: Payer: Self-pay | Admitting: Endocrinology

## 2018-03-14 ENCOUNTER — Ambulatory Visit: Payer: Medicare HMO | Admitting: Endocrinology

## 2018-03-14 VITALS — BP 124/72 | HR 64 | Ht 66.0 in | Wt 217.0 lb

## 2018-03-14 DIAGNOSIS — E1142 Type 2 diabetes mellitus with diabetic polyneuropathy: Secondary | ICD-10-CM | POA: Diagnosis not present

## 2018-03-14 DIAGNOSIS — Z794 Long term (current) use of insulin: Secondary | ICD-10-CM | POA: Diagnosis not present

## 2018-03-14 DIAGNOSIS — E119 Type 2 diabetes mellitus without complications: Secondary | ICD-10-CM | POA: Diagnosis not present

## 2018-03-14 DIAGNOSIS — E039 Hypothyroidism, unspecified: Secondary | ICD-10-CM | POA: Diagnosis not present

## 2018-03-14 LAB — POCT GLYCOSYLATED HEMOGLOBIN (HGB A1C): Hemoglobin A1C: 6.4 % — AB (ref 4.0–5.6)

## 2018-03-14 MED ORDER — INSULIN NPH (HUMAN) (ISOPHANE) 100 UNIT/ML ~~LOC~~ SUSP: 40.0000 [IU] | Freq: Every day | SUBCUTANEOUS | 11 refills | Status: DC

## 2018-03-14 MED ORDER — "INSULIN SYRINGE-NEEDLE U-100 31G X 5/16"" 1 ML MISC": 99 refills | Status: DC

## 2018-03-14 MED ORDER — METFORMIN HCL 1000 MG PO TABS: 1000.0000 mg | ORAL_TABLET | Freq: Two times a day (BID) | ORAL | 3 refills | Status: DC

## 2018-03-14 MED ORDER — INSULIN REGULAR HUMAN 100 UNIT/ML IJ SOLN: INTRAMUSCULAR | 11 refills | Status: DC

## 2018-03-14 MED ORDER — LEVOTHYROXINE SODIUM 75 MCG PO TABS: 75.0000 ug | ORAL_TABLET | Freq: Every day | ORAL | 2 refills | Status: DC

## 2018-03-14 NOTE — Progress Notes (Signed)
Subjective:    Patient ID: Terri Gray, female    DOB: Jul 26, 1955, 62 y.o.   MRN: [id number]  HPI Pt returns for f/u of diabetes mellitus: DM type: Insulin-requiring type 2.  Dx'ed: 2297 Complications: polyneuropathy Therapy: insulin since 2012 GDM: never DKA: never Severe hypoglycemia: never Pancreatitis: never Pancreatic imaging: never Other: she takes multiple daily injections; she takes human insulin, due to cost.   Interval History: no cbg record, but states cbg's vary from 76-370, but most are in the 100's.  It is still lowest in the afternoon. She takes NPH, 40 units QHS, and reg, 20 units 3 times a day (just before each meal).  She recently took prednisone for knee pain. She also has multinodular goiter (dx'ed 2018; she had bx twice (benign; F/u US in 2019 showed none of the nodules met imaging criteria for follow-up)). Past Medical History:  Diagnosis Date  . Allergy   . Arthritis   . Chicken pox   . Colon polyps   . Diabetes mellitus without complication ([place])   . GERD (gastroesophageal reflux disease)   . Hyperlipidemia   . Hypertension   . Migraines     Past Surgical History:  Procedure Laterality Date  . REPLACEMENT TOTAL KNEE Left 2016    Social History   Socioeconomic History  . Marital status: Married    Spouse name: Not on file  . Number of children: Not on file  . Years of education: Not on file  . Highest education level: Not on file  Occupational History  . Not on file  Social Needs  . Financial resource strain: Not on file  . Food insecurity:    Worry: Not on file    Inability: Not on file  . Transportation needs:    Medical: Not on file    Non-medical: Not on file  Tobacco Use  . Smoking status: Never Smoker  . Smokeless tobacco: Never Used  Substance and Sexual Activity  . Alcohol use: No  . Drug use: No  . Sexual activity: Not on file  Lifestyle  . Physical activity:    Days per week: Not on file    Minutes per session:  Not on file  . Stress: Not on file  Relationships  . Social connections:    Talks on phone: Not on file    Gets together: Not on file    Attends religious service: Not on file    Active member of club or organization: Not on file    Attends meetings of clubs or organizations: Not on file    Relationship status: Not on file  . Intimate partner violence:    Fear of current or ex partner: Not on file    Emotionally abused: Not on file    Physically abused: Not on file    Forced sexual activity: Not on file  Other Topics Concern  . Not on file  Social History Narrative  . Not on file    Current Outpatient Medications on File Prior to Visit  Medication Sig Dispense Refill  . cyclobenzaprine (FLEXERIL) 10 MG tablet Take 1 tablet (10 mg total) by mouth 3 (three) times daily as needed for muscle spasms. 90 tablet 5  . glucose blood (TRUE METRIX BLOOD GLUCOSE TEST) test strip 1 each by Other route 4 (four) times daily as needed for other. And lancets 2/day 360 each 3  . mesalamine (PENTASA) 500 MG CR capsule Take 1 capsule (500 mg total) by mouth 4 (four)  times daily. 112 capsule 0  . metoprolol tartrate (LOPRESSOR) 25 MG tablet Take 1 tablet (25 mg total) by mouth 2 (two) times daily. 180 tablet 2  . pantoprazole (PROTONIX) 40 MG tablet Take 1 tablet (40 mg total) by mouth daily. 90 tablet 3  . simvastatin (ZOCOR) 40 MG tablet Take 1 tablet (40 mg total) by mouth daily. 90 tablet 0   No current facility-administered medications on file prior to visit.     No Known Allergies  Family History  Problem Relation Age of Onset  . Diabetes Father     BP 124/72   Pulse 64   Ht 5' 6"  (1.676 m)   Wt 217 lb (98.4 kg)   SpO2 98%   BMI 35.02 kg/m    Review of Systems She denies hypoglycemia.      Objective:   Physical Exam VITAL SIGNS:  See vs page GENERAL: no distress NECK: thyroid has irreg surface, but I can't tell details.   Pulses: dorsalis pedis intact bilat.   MSK: no  deformity of the feet CV: no leg edema Skin:  no ulcer on the feet.  normal color and temp on the feet. Neuro: sensation is intact to touch on the feet  Lab Results  Component Value Date   HGBA1C 6.4 (A) 03/14/2018   Lab Results  Component Value Date   CREATININE 0.84 02/20/2018   BUN 10 02/20/2018   NA 140 02/20/2018   K 4.9 02/20/2018   CL 103 02/20/2018   CO2 28 02/20/2018       Assessment & Plan:  Insulin-requiring type 2 DM, with polyneuropathy: overcontrolled Multinodular goiter: clinically stable Knee pain: as she recently had prednisone, she may need further insulin reduction.  We discussed.  Patient Instructions  check your blood sugar twice a day.  vary the time of day when you check, between before the 3 meals, and at bedtime.  also check if you have symptoms of your blood sugar being too high or too low.  please keep a record of the readings and bring it to your next appointment here (or you can bring the meter itself).  You can write it on any piece of paper.  please call us sooner if your blood sugar goes below 70, or if you have a lot of readings over 200.  Please reduce the lunch regular to 15 units Please continue the same other insulin shots.  Please come back for a follow-up appointment in 4 months.

## 2018-03-14 NOTE — Patient Instructions (Addendum)
check your blood sugar twice a day.  vary the time of day when you check, between before the 3 meals, and at bedtime.  also check if you have symptoms of your blood sugar being too high or too low.  please keep a record of the readings and bring it to your next appointment here (or you can bring the meter itself).  You can write it on any piece of paper.  please call us sooner if your blood sugar goes below 70, or if you have a lot of readings over 200.  Please reduce the lunch regular to 15 units Please continue the same other insulin shots.  Please come back for a follow-up appointment in 4 months.

## 2018-04-03 ENCOUNTER — Telehealth: Payer: Self-pay | Admitting: Family Medicine

## 2018-04-03 NOTE — Telephone Encounter (Signed)
I called the patient to let them know that the form was ready. The phone number voice mail is full and I was not able to leave a voice mail. I mailed the form to the address listed.

## 2018-04-16 ENCOUNTER — Encounter: Payer: Self-pay | Admitting: Family Medicine

## 2018-07-03 ENCOUNTER — Ambulatory Visit (INDEPENDENT_AMBULATORY_CARE_PROVIDER_SITE_OTHER): Payer: Medicare Other | Admitting: Family Medicine

## 2018-07-03 ENCOUNTER — Encounter: Payer: Self-pay | Admitting: Family Medicine

## 2018-07-03 VITALS — BP 118/78 | HR 70 | Temp 98.6°F | Wt 216.0 lb

## 2018-07-03 DIAGNOSIS — E1142 Type 2 diabetes mellitus with diabetic polyneuropathy: Secondary | ICD-10-CM | POA: Diagnosis not present

## 2018-07-03 DIAGNOSIS — M26609 Unspecified temporomandibular joint disorder, unspecified side: Secondary | ICD-10-CM

## 2018-07-03 DIAGNOSIS — K5190 Ulcerative colitis, unspecified, without complications: Secondary | ICD-10-CM | POA: Diagnosis not present

## 2018-07-03 DIAGNOSIS — Z794 Long term (current) use of insulin: Secondary | ICD-10-CM | POA: Diagnosis not present

## 2018-07-03 DIAGNOSIS — E039 Hypothyroidism, unspecified: Secondary | ICD-10-CM

## 2018-07-03 DIAGNOSIS — R079 Chest pain, unspecified: Secondary | ICD-10-CM

## 2018-07-03 LAB — LIPID PANEL
Cholesterol: 136 mg/dL (ref 0–200)
HDL: 37.8 mg/dL — ABNORMAL LOW (ref >39.00)
LDL Cholesterol: 59 mg/dL (ref 0–99)
NonHDL: 97.9
TRIGLYCERIDES: 193 mg/dL — AB (ref 0.0–149.0)
Total CHOL/HDL Ratio: 4
VLDL: 38.6 mg/dL (ref 0.0–40.0)

## 2018-07-03 LAB — BASIC METABOLIC PANEL
BUN: 9 mg/dL (ref 6–23)
CALCIUM: 9.4 mg/dL (ref 8.4–10.5)
CO2: 28 meq/L (ref 19–32)
CREATININE: 0.85 mg/dL (ref 0.40–1.20)
Chloride: 101 mEq/L (ref 96–112)
GFR: 81.65 mL/min (ref >60.00)
GLUCOSE: 108 mg/dL — AB (ref 70–99)
Potassium: 4.7 mEq/L (ref 3.5–5.1)
Sodium: 137 mEq/L (ref 135–145)

## 2018-07-03 LAB — TSH: TSH: 0.64 u[IU]/mL (ref 0.35–4.50)

## 2018-07-03 LAB — HEMOGLOBIN A1C: Hgb A1c MFr Bld: 7.1 % — ABNORMAL HIGH (ref 4.6–6.5)

## 2018-07-03 LAB — CBC WITH DIFFERENTIAL/PLATELET
BASOS ABS: 0.1 10*3/uL (ref 0.0–0.1)
Basophils Relative: 1 % (ref 0.0–3.0)
EOS PCT: 10.5 % — AB (ref 0.0–5.0)
Eosinophils Absolute: 0.6 10*3/uL (ref 0.0–0.7)
HEMATOCRIT: 36.6 % (ref 36.0–46.0)
Hemoglobin: 11.9 g/dL — ABNORMAL LOW (ref 12.0–15.0)
LYMPHS PCT: 42.6 % (ref 12.0–46.0)
Lymphs Abs: 2.6 10*3/uL (ref 0.7–4.0)
MCHC: 32.6 g/dL (ref 30.0–36.0)
MCV: 78.4 fl (ref 78.0–100.0)
MONOS PCT: 7.4 % (ref 3.0–12.0)
Monocytes Absolute: 0.4 10*3/uL (ref 0.1–1.0)
Neutro Abs: 2.3 10*3/uL (ref 1.4–7.7)
Neutrophils Relative %: 38.5 % — ABNORMAL LOW (ref 43.0–77.0)
Platelets: 308 10*3/uL (ref 150.0–400.0)
RBC: 4.66 Mil/uL (ref 3.87–5.11)
RDW: 15.5 % (ref 11.5–15.5)
WBC: 6 10*3/uL (ref 4.0–10.5)

## 2018-07-03 LAB — TROPONIN I: TNIDX: 0 ug/l (ref 0.00–0.06)

## 2018-07-03 MED ORDER — MESALAMINE ER 500 MG PO CPCR: 500.0000 mg | ORAL_CAPSULE | Freq: Four times a day (QID) | ORAL | 3 refills | Status: DC

## 2018-07-03 MED ORDER — CYCLOBENZAPRINE HCL 10 MG PO TABS: 10.0000 mg | ORAL_TABLET | ORAL | 5 refills | Status: DC | PRN

## 2018-07-03 MED ORDER — LEVOTHYROXINE SODIUM 75 MCG PO TABS: 75.0000 ug | ORAL_TABLET | Freq: Every day | ORAL | 2 refills | Status: DC

## 2018-07-03 NOTE — Patient Instructions (Addendum)
The referral for gastroenterology was placed in October.  To schedule an appointment call the GI office at [phone].  You can asked to speak with [facility].   Nonspecific Chest Pain, Adult Chest pain can be caused by many different conditions. It can be caused by a condition that is life-threatening and requires treatment right away. It can also be caused by something that is not life-threatening. If you have chest pain, it can be hard to know the difference, so it is important to get help right away to make sure that you do not have a serious condition. Some life-threatening causes of chest pain include:  Heart attack.  A tear in the body's main blood vessel (aortic dissection).  Inflammation around your heart (pericarditis).  A problem in the lungs, such as a blood clot (pulmonary embolism) or a collapsed lung (pneumothorax). Some non life-threatening causes of chest pain include:  Heartburn.  Anxiety or stress.  Damage to the bones, muscles, and cartilage that make up your chest wall.  Pneumonia or bronchitis.  Shingles infection (varicella-zoster virus). Chest pain can feel like:  Pain or discomfort on the surface of your chest or deep in your chest.  Crushing, pressure, aching, or squeezing pain.  Burning or tingling.  Dull or sharp pain that is worse when you move, cough, or take a deep breath.  Pain or discomfort that is also felt in your back, neck, jaw, shoulder, or arm, or pain that spreads to any of these areas. Your chest pain may come and go. It may also be constant. Your health care provider will do lab tests and other studies to find the cause of your pain. Treatment will depend on the cause of your chest pain. Follow these instructions at home: Medicines  Take over-the-counter and prescription medicines only as told by your health care provider.  If you were prescribed an antibiotic, take it as told by your health care provider. Do not stop taking the  antibiotic even if you start to feel better. Lifestyle   Rest as directed by your health care provider.  Do not use any products that contain nicotine or tobacco, such as cigarettes and e-cigarettes. If you need help quitting, ask your health care provider.  Do not drink alcohol.  Make healthy lifestyle choices as recommended. These may include: ? Getting regular exercise. Ask your health care provider to suggest some activities that are safe for you. ? Eating a heart-healthy diet. This includes plenty of fresh fruits and vegetables, whole grains, low-fat (lean) protein, and low-fat dairy products. A dietitian can help you find healthy eating options. ? Maintaining a healthy weight. ? Managing any other health conditions you have, such as high blood pressure (hypertension) or diabetes. ? Reducing stress, such as with yoga or relaxation techniques. General instructions  Pay attention to any changes in your symptoms. Tell your health care provider about them or any new symptoms.  Avoid any activities that cause chest pain.  Keep all follow-up visits as told by your health care provider. This is important. This includes visits for any further testing if your chest pain does not go away. Contact a health care provider if:  Your chest pain does not go away.  You feel depressed.  You have a fever. Get help right away if:  Your chest pain gets worse.  You have a cough that gets worse, or you cough up blood.  You have severe pain in your abdomen.  You faint.  You have sudden,  unexplained chest discomfort.  You have sudden, unexplained discomfort in your arms, back, neck, or jaw.  You have shortness of breath at any time.  You suddenly start to sweat, or your skin gets clammy.  You feel nausea or you vomit.  You suddenly feel lightheaded or dizzy.  You have severe weakness, or unexplained weakness or fatigue.  Your heart begins to beat quickly, or it feels like it is  skipping beats. These symptoms may represent a serious problem that is an emergency. Do not wait to see if the symptoms will go away. Get medical help right away. Call your local emergency services (911 in the [place] Do not drive yourself to the hospital. Summary  Chest pain can be caused by a condition that is serious and requires urgent treatment. It may also be caused by something that is not life-threatening.  If you have chest pain, it is very important to see your health care provider. Your health care provider may do lab tests and other studies to find the cause of your pain.  Follow your health care provider's instructions on taking medicines, making lifestyle changes, and getting emergency treatment if symptoms become worse.  Keep all follow-up visits as told by your health care provider. This includes visits for any further testing if your chest pain does not go away. This information is not intended to replace advice given to you by your health care provider. Make sure you discuss any questions you have with your health care provider. Document Released: 02/15/2005 Document Revised: 11/08/2017 Document Reviewed: 11/08/2017 Elsevier Interactive Patient Education  2019 [employer].

## 2018-07-03 NOTE — Progress Notes (Signed)
Subjective:    Patient ID: Terri Gray, female    DOB: Mar 21, 1956, 63 y.o.   MRN: [id number]  No chief complaint on file.   HPI Patient was seen today for f/u.    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 -denies changes in vision, HAs, N/V  DM II: -followed by Endocrinology, Dr. Loanne Gray -has neuropathy.   -Endorses burning and throbbing in hands and foot -tried Gabapentin and Tramadol without relief -taking NPH 40 units qhs, regular insulin 15 units at lunch, and 20 with other meals -denies hypoglycemia -diabetic foot exam and eye exam up to date  Pt inquires about GI referral for h/o hiatal hernia.  States has not received a phone call regarding this, though referral placed in Oct.  Did receive the phone call about f/u with Terri Gray.  Pt requesting refills on meds.   Past Medical History:  Diagnosis Date  . Allergy   . Arthritis   . Chicken pox   . Colon polyps   . Diabetes mellitus without complication ([place])   . GERD (gastroesophageal reflux disease)   . Hyperlipidemia   . Hypertension   . Migraines     No Known Allergies  ROS General: Denies fever, chills, night sweats, changes in weight, changes in appetite HEENT: Denies headaches, ear pain, changes in vision, rhinorrhea, sore throat CV: Denies palpitations, SOB, orthopnea  +CP Pulm: Denies SOB, cough, wheezing GI: Denies abdominal pain, nausea, vomiting, diarrhea, constipation GU: Denies dysuria, hematuria, frequency, vaginal discharge Msk: Denies muscle cramps, joint pains Neuro: Denies weakness   +burning, numbness, tingling in hands and foot. Skin: Denies rashes, bruising Psych: Denies depression,  anxiety, hallucinations     Objective:    Blood pressure 118/78, pulse 70, temperature 98.6 F (37 C), temperature source Oral, weight 216 lb (98 kg), SpO2 98 %.  Gen. Pleasant, well-nourished, in no distress, normal affect   HEENT: [name]/AT, face symmetric, no scleral icterus, PERRLA, EOMI, nares patent without drainage Neck: No JVD, no thyromegaly, no carotid bruits Lungs: no accessory muscle use, CTAB, no wheezes or rales Cardiovascular: RRR, no m/r/g, no peripheral edema. Musculoskeletal: No deformities, no cyanosis or clubbing, normal tone Neuro:  A&Ox3, CN II-XII intact, normal gait Skin:  Warm, no lesions/ rash   Wt Readings from Last 3 Encounters:  07/03/18 216 lb (98 kg)  03/14/18 217 lb (98.4 kg)  02/20/18 218 lb (98.9 kg)    Lab Results  Component Value Date   WBC 7.0 02/28/2017   HGB 12.1 02/28/2017   HCT 37.4 02/28/2017   PLT 314.0 02/28/2017   GLUCOSE 102 (H) 02/20/2018   NA 140 02/20/2018   K 4.9 02/20/2018   CL 103 02/20/2018   CREATININE 0.84 02/20/2018   BUN 10 02/20/2018   CO2 28 02/20/2018   TSH 0.85 02/20/2018   HGBA1C 6.4 (A) 03/14/2018    Assessment/Plan:  Chest pain, unspecified type  -EKG normal, VR 63, no ischemic changes noted.  No previous study for comparison -discussed possible causes including angina, anxiety, hiatal hernia, etc. -will place referral to Cardiology for f/u -given RTC or ED precuations - Plan: CBC with Differential/Platelet, Basic metabolic panel, TSH, Lipid panel, Troponin I, EKG  12-Lead, Ambulatory referral to Cardiology  Type 2 diabetes mellitus with diabetic polyneuropathy, with long-term current use of insulin (HCC)  -Continue current regimen -discussed options for neuropathy, pt wishes to wait at this time.  Encouraged to discuss with Endocrinology. - Plan: Hemoglobin A1c  Ulcerative colitis without complications, unspecified location [facility])  -referral placed for GI, pt encouraged to contact GI office in regards  to scheduling this appt. - Plan: mesalamine (PENTASA) 500 MG CR capsule  Acquired hypothyroidism  -will check TSH today - Plan: levothyroxine (SYNTHROID, LEVOTHROID) 75 MCG tablet  TMJ (temporomandibular joint disorder)  - Plan: cyclobenzaprine (FLEXERIL) 10 MG tablet  F/u prn  [name], MD

## 2018-07-04 ENCOUNTER — Telehealth: Payer: Self-pay | Admitting: Family Medicine

## 2018-07-04 NOTE — Telephone Encounter (Signed)
Copied from [place] [id number]. Topic: Quick Communication - See Telephone Encounter >> Jul 04, 2018  1:11 PM Blase Mess A wrote: CRM for notification. See Telephone encounter for: 07/04/18.  [place] [facility] is calling regardinglevothyroxine Wil[place] Floor, LEVOTHROID) 75 MCG tablet [[id number]] requesting a different manufacturer please advise [phone]

## 2018-07-04 NOTE — Telephone Encounter (Signed)
I called [facility] and spoke with Felecia.  She stated the previous Rx, same dose was under a different manufacturer and they wanted a verbal to change.  Message sent to Dr Volanda Napoleon.

## 2018-07-05 ENCOUNTER — Ambulatory Visit: Payer: Medicare HMO | Admitting: Family Medicine

## 2018-07-05 NOTE — Telephone Encounter (Signed)
ok 

## 2018-07-05 NOTE — Telephone Encounter (Signed)
Spoke with Belenda Cruise with [facility] verbalized understanding that Dr Volanda Napoleon approved the generic synthroid as requested

## 2018-07-08 ENCOUNTER — Encounter: Payer: Self-pay | Admitting: Family Medicine

## 2018-07-10 ENCOUNTER — Ambulatory Visit: Payer: Medicare Other | Admitting: Cardiology

## 2018-07-10 ENCOUNTER — Encounter: Payer: Self-pay | Admitting: Cardiology

## 2018-07-10 ENCOUNTER — Telehealth: Payer: Self-pay | Admitting: Cardiology

## 2018-07-10 DIAGNOSIS — R0609 Other forms of dyspnea: Secondary | ICD-10-CM | POA: Insufficient documentation

## 2018-07-10 DIAGNOSIS — R0789 Other chest pain: Secondary | ICD-10-CM | POA: Diagnosis not present

## 2018-07-10 DIAGNOSIS — R002 Palpitations: Secondary | ICD-10-CM | POA: Insufficient documentation

## 2018-07-10 DIAGNOSIS — R42 Dizziness and giddiness: Secondary | ICD-10-CM | POA: Diagnosis not present

## 2018-07-10 NOTE — Patient Instructions (Signed)
Medication Instructions:  Your physician recommends that you continue on your current medications as directed. Please refer to the Current Medication list given to you today.  If you need a refill on your cardiac medications before your next appointment, please call your pharmacy.   Lab work: None.  If you have labs (blood work) drawn today and your tests are completely normal, you will receive your results only by: Marland Kitchen MyChart Message (if you have MyChart) OR . A paper copy in the mail If you have any lab test that is abnormal or we need to change your treatment, we will call you to review the results.  Testing/Procedures: Your physician has requested that you have an echocardiogram. Echocardiography is a painless test that uses sound waves to create images of your heart. It provides your doctor with information about the size and shape of your heart and how well your heart's chambers and valves are working. This procedure takes approximately one hour. There are no restrictions for this procedure.  Your physician has recommended that you wear a holter monitor. Holter monitors are medical devices that record the heart's electrical activity. Doctors most often use these monitors to diagnose arrhythmias. Arrhythmias are problems with the speed or rhythm of the heartbeat. The monitor is a small, portable device. You can wear one while you do your normal daily activities. This is usually used to diagnose what is causing palpitations/syncope (passing out). Wear for 14 days.   Your physician has requested that you have a lexiscan myoview. For further information please visit [email or web address]. Please follow instruction sheet, as given.      Follow-Up: At [facility], you and your health needs are our priority.  As part of our continuing mission to provide you with exceptional heart care, we have created designated Provider Care Teams.  These Care Teams include your primary Cardiologist  (physician) and Advanced Practice Providers (APPs -  Physician Assistants and Nurse Practitioners) who all work together to provide you with the care you need, when you need it. You will need a follow up appointment in 1 months.  Please call our office 2 months in advance to schedule this appointment.  You may see No primary care provider on file. or another member of our [employer] Provider Team in [place]: Shirlee More, MD . Jyl Heinz, MD  Any Other Special Instructions Will Be Listed Below (If Applicable).   Echocardiogram An echocardiogram is a procedure that uses painless sound waves (ultrasound) to produce an image of the heart. Images from an echocardiogram can provide important information about:  Signs of coronary artery disease (CAD).  Aneurysm detection. An aneurysm is a weak or damaged part of an artery wall that bulges out from the normal force of blood pumping through the body.  Heart size and shape. Changes in the size or shape of the heart can be associated with certain conditions, including heart failure, aneurysm, and CAD.  Heart muscle function.  Heart valve function.  Signs of a past heart attack.  Fluid buildup around the heart.  Thickening of the heart muscle.  A tumor or infectious growth around the heart valves. Tell a health care provider about:  Any allergies you have.  All medicines you are taking, including vitamins, herbs, eye drops, creams, and over-the-counter medicines.  Any blood disorders you have.  Any surgeries you have had.  Any medical conditions you have.  Whether you are pregnant or may be pregnant. What are the risks? Generally, this is  a safe procedure. However, problems may occur, including:  Allergic reaction to dye (contrast) that may be used during the procedure. What happens before the procedure? No specific preparation is needed. You may eat and drink normally. What happens during the procedure?   An IV tube  may be inserted into one of your veins.  You may receive contrast through this tube. A contrast is an injection that improves the quality of the pictures from your heart.  A gel will be applied to your chest.  A wand-like tool (transducer) will be moved over your chest. The gel will help to transmit the sound waves from the transducer.  The sound waves will harmlessly bounce off of your heart to allow the heart images to be captured in real-time motion. The images will be recorded on a computer. The procedure may vary among health care providers and hospitals. What happens after the procedure?  You may return to your normal, everyday life, including diet, activities, and medicines, unless your health care provider tells you not to do that. Summary  An echocardiogram is a procedure that uses painless sound waves (ultrasound) to produce an image of the heart.  Images from an echocardiogram can provide important information about the size and shape of your heart, heart muscle function, heart valve function, and fluid buildup around your heart.  You do not need to do anything to prepare before this procedure. You may eat and drink normally.  After the echocardiogram is completed, you may return to your normal, everyday life, unless your health care provider tells you not to do that. This information is not intended to replace advice given to you by your health care provider. Make sure you discuss any questions you have with your health care provider. Document Released: 05/05/2000 Document Revised: 06/10/2016 Document Reviewed: 06/10/2016 Elsevier Interactive Patient Education  2019 [place].   Cardiac Nuclear Scan A cardiac nuclear scan is a test that measures blood flow to the heart when a person is resting and when he or she is exercising. The test looks for problems such as:  Not enough blood reaching a portion of the heart.  The heart muscle not working normally. You may need  this test if:  You have heart disease.  You have had abnormal lab results.  You have had heart surgery or a balloon procedure to open up blocked arteries (angioplasty).  You have chest pain.  You have shortness of breath. In this test, a radioactive dye (tracer) is injected into your bloodstream. After the tracer has traveled to your heart, an imaging device is used to measure how much of the tracer is absorbed by or distributed to various areas of your heart. This procedure is usually done at a hospital and takes 2-4 hours. Tell a health care provider about:  Any allergies you have.  All medicines you are taking, including vitamins, herbs, eye drops, creams, and over-the-counter medicines.  Any problems you or family members have had with anesthetic medicines.  Any blood disorders you have.  Any surgeries you have had.  Any medical conditions you have.  Whether you are pregnant or may be pregnant. What are the risks? Generally, this is a safe procedure. However, problems may occur, including:  Serious chest pain and heart attack. This is only a risk if the stress portion of the test is done.  Rapid heartbeat.  Sensation of warmth in your chest. This usually passes quickly.  Allergic reaction to the tracer. What happens before  the procedure?  Ask your health care provider about changing or stopping your regular medicines. This is especially important if you are taking diabetes medicines or blood thinners.  Follow instructions from your health care provider about eating or drinking restrictions.  Remove your jewelry on the day of the procedure. What happens during the procedure?  An IV will be inserted into one of your veins.  Your health care provider will inject a small amount of radioactive tracer through the IV.  You will wait for 20-40 minutes while the tracer travels through your bloodstream.  Your heart activity will be monitored with an electrocardiogram  (ECG).  You will lie down on an exam table.  Images of your heart will be taken for about 15-20 minutes.  You may also have a stress test. For this test, one of the following may be done: ? You will exercise on a treadmill or stationary bike. While you exercise, your heart's activity will be monitored with an ECG, and your blood pressure will be checked. ? You will be given medicines that will increase blood flow to parts of your heart. This is done if you are unable to exercise.  When blood flow to your heart has peaked, a tracer will again be injected through the IV.  After 20-40 minutes, you will get back on the exam table and have more images taken of your heart.  Depending on the type of tracer used, scans may need to be repeated 3-4 hours later.  Your IV line will be removed when the procedure is over. The procedure may vary among health care providers and hospitals. What happens after the procedure?  Unless your health care provider tells you otherwise, you may return to your normal schedule, including diet, activities, and medicines.  Unless your health care provider tells you otherwise, you may increase your fluid intake. This will help to flush the contrast dye from your body. Drink enough fluid to keep your urine pale yellow.  Ask your health care provider, or the department that is doing the test: ? When will my results be ready? ? How will I get my results? Summary  A cardiac nuclear scan measures the blood flow to the heart when a person is resting and when he or she is exercising.  Tell your health care provider if you are pregnant.  Before the procedure, ask your health care provider about changing or stopping your regular medicines. This is especially important if you are taking diabetes medicines or blood thinners.  After the procedure, unless your health care provider tells you otherwise, increase your fluid intake. This will help flush the contrast dye from your  body.  After the procedure, unless your health care provider tells you otherwise, you may return to your normal schedule, including diet, activities, and medicines. This information is not intended to replace advice given to you by your health care provider. Make sure you discuss any questions you have with your health care provider. Document Released: 06/02/2004 Document Revised: 10/22/2017 Document Reviewed: 10/22/2017 Elsevier Interactive Patient Education  2019 [employer].

## 2018-07-10 NOTE — Progress Notes (Signed)
Cardiology Consultation:    Date:  07/10/2018   ID:  Terri Gray, DOB 02-02-1956, MRN [id number]  PCP:  [name], MD  Cardiologist:  Jenne Campus, MD   Referring MD: [name], MD   Chief Complaint  Patient presents with  . Chest Pain    Comes and goes, cardiac family history, had an episode in 2008 of chest pain  I have chest pain and palpitations  History of Present Illness:    Terri Gray is a 63 y.o. female who is being seen today for the evaluation of chest pain and palpitations at the request of [name], MD.  She is a 63 years old woman whose family is actually from [place] however she spent her childhood in [place] after that she moved to [place] and then about 30 years ago she ended up in [place] she does have a daughter with Brugada syndrome.  She presented to my office with chief complaint of palpitations she described that happen every other day she could be sitting and started feeling heart warm-like sensation and then her heart will speed up she thinks it goes irregular.  Many times this sensation is associated with heavy-like sensation in the chest.  That sensation usually last few minutes she typically lays down and take few deep breath and gradually slowly heart will return to baseline.  She does have shortness of breath with this some dizziness but never syncope.  She experience the symptoms for last few months however recently became more frequent 8 years ago she got similar symptomatology and at that time quite extensive evaluation has been done which included stress test as well as echocardiogram.  Apparently both were negative she was however diagnosed with hypertension as well as diabetes. Overall she does not exercise on the regular basis. Take care of her sick husband who got multiple medical problems including cardiomyopathy and defibrillator. She does have a daughter with Brugada syndrome she is 58 years old does have  defibrillator as well Never smoke Does not know much about her family medical history.  Past Medical History:  Diagnosis Date  . Allergy   . Arthritis   . Chicken pox   . Colon polyps   . Diabetes mellitus without complication ([place])   . GERD (gastroesophageal reflux disease)   . Hyperlipidemia   . Hypertension   . Migraines     Past Surgical History:  Procedure Laterality Date  . REPLACEMENT TOTAL KNEE Left 2016    Current Medications: Current Meds  Medication Sig  . cyclobenzaprine (FLEXERIL) 10 MG tablet Take 1 tablet (10 mg total) by mouth as needed for muscle spasms.  Marland Kitchen glucose blood (TRUE METRIX BLOOD GLUCOSE TEST) test strip 1 each by Other route 4 (four) times daily as needed for other. And lancets 2/day  . insulin NPH Human (NOVOLIN N RELION) 100 UNIT/ML injection Inject 0.4 mLs (40 Units total) into the skin at bedtime.  . insulin regular (NOVOLIN R RELION) 100 units/mL injection 3 times a day (just before each meal), 20-15-20 units. (Patient taking differently: 3 times a day (just before each meal), 20-20-20 units.)  . Insulin Syringe-Needle U-100 (RELION INSULIN SYRINGE 1ML/31G) 31G X 5/16" 1 ML MISC Used to give insulin injections four times daily.  Marland Kitchen levothyroxine (SYNTHROID, LEVOTHROID) 75 MCG tablet Take 1 tablet (75 mcg total) by mouth daily before breakfast.  . metFORMIN (GLUCOPHAGE) 1000 MG tablet Take 1 tablet (1,000 mg total) by mouth 2 (two) times daily  with a meal.  . metoprolol tartrate (LOPRESSOR) 25 MG tablet Take 1 tablet (25 mg total) by mouth 2 (two) times daily.  . pantoprazole (PROTONIX) 40 MG tablet Take 1 tablet (40 mg total) by mouth daily.  . simvastatin (ZOCOR) 40 MG tablet Take 1 tablet (40 mg total) by mouth daily.     Allergies:   Patient has no known allergies.   Social History   Socioeconomic History  . Marital status: Married    Spouse name: Not on file  . Number of children: Not on file  . Years of education: Not on file  .  Highest education level: Not on file  Occupational History  . Not on file  Social Needs  . Financial resource strain: Not on file  . Food insecurity:    Worry: Not on file    Inability: Not on file  . Transportation needs:    Medical: Not on file    Non-medical: Not on file  Tobacco Use  . Smoking status: Never Smoker  . Smokeless tobacco: Never Used  Substance and Sexual Activity  . Alcohol use: No  . Drug use: No  . Sexual activity: Not on file  Lifestyle  . Physical activity:    Days per week: Not on file    Minutes per session: Not on file  . Stress: Not on file  Relationships  . Social connections:    Talks on phone: Not on file    Gets together: Not on file    Attends religious service: Not on file    Active member of club or organization: Not on file    Attends meetings of clubs or organizations: Not on file    Relationship status: Not on file  Other Topics Concern  . Not on file  Social History Narrative  . Not on file     Family History: The patient's family history includes Cancer in her mother; Diabetes in her brother, father, sister, and sister; Heart failure in her father; Kidney disease in her father. ROS:   Please see the history of present illness.    All 14 point review of systems negative except as described per history of present illness.  EKGs/Labs/Other Studies Reviewed:    The following studies were reviewed today: EKG done by primary care physician on 12th of this month showed normal sinus rhythm normal P interval normal QS complex duration morphology no evidence of Brugada on that EKG.    Recent Labs: 07/03/2018: BUN 9; Creatinine, Ser 0.85; Hemoglobin 11.9; Platelets 308.0; Potassium 4.7; Sodium 137; TSH 0.64  Recent Lipid Panel    Component Value Date/Time   CHOL 136 07/03/2018 1051   TRIG 193.0 (H) 07/03/2018 1051   HDL 37.80 (L) 07/03/2018 1051   CHOLHDL 4 07/03/2018 1051   VLDL 38.6 07/03/2018 1051   LDLCALC 59 07/03/2018 1051     Physical Exam:    VS:  BP 120/72   Pulse 65   Ht 5' 6"  (1.676 m)   Wt 219 lb 12.8 oz (99.7 kg)   SpO2 97%   BMI 35.48 kg/m     Wt Readings from Last 3 Encounters:  07/10/18 219 lb 12.8 oz (99.7 kg)  07/03/18 216 lb (98 kg)  03/14/18 217 lb (98.4 kg)     GEN:  Well nourished, well developed in no acute distress HEENT: Normal NECK: No JVD; No carotid bruits LYMPHATICS: No lymphadenopathy CARDIAC: RRR, no murmurs, no rubs, no gallops RESPIRATORY:  Clear to auscultation  without rales, wheezing or rhonchi  ABDOMEN: Soft, non-tender, non-distended MUSCULOSKELETAL:  No edema; No deformity  SKIN: Warm and dry NEUROLOGIC:  Alert and oriented x 3 PSYCHIATRIC:  Normal affect   ASSESSMENT:    1. Palpitations   2. Dyspnea on exertion   3. Atypical chest pain   4. Dizziness    PLAN:    In order of problems listed above:  1. Palpitations in this lady who had family history of Brugada syndrome.  Obviously story is very concerning I will put monitor on her for 2 weeks time to see exactly what kind of arrhythmia she is experiencing.  It could be completely not related to Brugada or she may have atrial fibrillation regardless need to be aggressively investigated. 2. Dyspnea on exertion as a part of evaluation will ask to have echocardiogram done to assess left ventricular ejection fraction.  I will not initiate any of her medication right now. 3. Atypical chest pain in form of pressure-like sensation I will schedule her to have a stress test to rule out any ischemia.  She does have multiple risk factors for coronary artery disease therefore in view of her symptoms that need to be investigated as well. 4. Dizziness while having palpitations she is noted that she is to pull over the car when driving and have the sensation she never passed out. 5. Ask her to start taking one baby aspirin every single day.  Rest of the medication will be the same which includes statin.  I see her back in my  office in 4 weeks. 6. Diabetes mellitu: Follow-up by medicine team.  Last hemoglobin A1c 7.1. 7. Dyslipidemia: LDL 59 in 12 February of this year with HDL of 37.   Medication Adjustments/Labs and Tests Ordered: Current medicines are reviewed at length with the patient today.  Concerns regarding medicines are outlined above.  No orders of the defined types were placed in this encounter.  No orders of the defined types were placed in this encounter.   Signed, Park Liter, MD, [facility]. 07/10/2018 3:02 PM    [facility]

## 2018-07-10 NOTE — Telephone Encounter (Signed)
DOne

## 2018-07-16 ENCOUNTER — Ambulatory Visit: Payer: Medicare Other | Admitting: Endocrinology

## 2018-07-16 ENCOUNTER — Encounter: Payer: Self-pay | Admitting: Endocrinology

## 2018-07-16 DIAGNOSIS — M79672 Pain in left foot: Secondary | ICD-10-CM | POA: Diagnosis not present

## 2018-07-16 MED ORDER — INSULIN NPH (HUMAN) (ISOPHANE) 100 UNIT/ML ~~LOC~~ SUSP: 40.0000 [IU] | Freq: Every day | SUBCUTANEOUS | 11 refills | Status: DC

## 2018-07-16 MED ORDER — LEVOTHYROXINE SODIUM 75 MCG PO TABS: 75.0000 ug | ORAL_TABLET | Freq: Every day | ORAL | 3 refills | Status: DC

## 2018-07-16 MED ORDER — INSULIN REGULAR HUMAN 100 UNIT/ML IJ SOLN: INTRAMUSCULAR | 11 refills | Status: DC

## 2018-07-16 NOTE — Progress Notes (Signed)
Subjective:    Patient ID: Terri Gray, female    DOB: 12-25-1955, 63 y.o.   MRN: [id number]  HPI Pt returns for f/u of diabetes mellitus: DM type: Insulin-requiring type 2.  Dx'ed: 5929 Complications: polyneuropathy Therapy: insulin since 2012 GDM: never DKA: never Severe hypoglycemia: never.  Pancreatitis: never Pancreatic imaging: never Other: she takes multiple daily injections; she takes human insulin, due to cost.   Interval History: no cbg record, but states cbg's vary from 97-225.  It is lowest at lunch, and highest at HS. She seldom has hypoglycemia sxs, and these episodes are mild.  No recent steroids.  She never misses the insulin.   She also has multinodular goiter (dx'ed 2018; she had bx twice (benign; F/u US in 2019 showed none of the nodules met imaging criteria for follow-up)).   Past Medical History:  Diagnosis Date  . Allergy   . Arthritis   . Chicken pox   . Colon polyps   . Diabetes mellitus without complication ([place])   . GERD (gastroesophageal reflux disease)   . Hyperlipidemia   . Hypertension   . Migraines     Past Surgical History:  Procedure Laterality Date  . REPLACEMENT TOTAL KNEE Left 2016    Social History   Socioeconomic History  . Marital status: Married    Spouse name: Not on file  . Number of children: Not on file  . Years of education: Not on file  . Highest education level: Not on file  Occupational History  . Not on file  Social Needs  . Financial resource strain: Not on file  . Food insecurity:    Worry: Not on file    Inability: Not on file  . Transportation needs:    Medical: Not on file    Non-medical: Not on file  Tobacco Use  . Smoking status: Never Smoker  . Smokeless tobacco: Never Used  Substance and Sexual Activity  . Alcohol use: No  . Drug use: No  . Sexual activity: Not on file  Lifestyle  . Physical activity:    Days per week: Not on file    Minutes per session: Not on file  . Stress: Not on  file  Relationships  . Social connections:    Talks on phone: Not on file    Gets together: Not on file    Attends religious service: Not on file    Active member of club or organization: Not on file    Attends meetings of clubs or organizations: Not on file    Relationship status: Not on file  . Intimate partner violence:    Fear of current or ex partner: Not on file    Emotionally abused: Not on file    Physically abused: Not on file    Forced sexual activity: Not on file  Other Topics Concern  . Not on file  Social History Narrative  . Not on file    Current Outpatient Medications on File Prior to Visit  Medication Sig Dispense Refill  . cyclobenzaprine (FLEXERIL) 10 MG tablet Take 1 tablet (10 mg total) by mouth as needed for muscle spasms. 90 tablet 5  . glucose blood (TRUE METRIX BLOOD GLUCOSE TEST) test strip 1 each by Other route 4 (four) times daily as needed for other. And lancets 2/day 360 each 3  . Insulin Syringe-Needle U-100 (RELION INSULIN SYRINGE 1ML/31G) 31G X 5/16" 1 ML MISC Used to give insulin injections four times daily. 200 each prn  .  metFORMIN (GLUCOPHAGE) 1000 MG tablet Take 1 tablet (1,000 mg total) by mouth 2 (two) times daily with a meal. 180 tablet 3  . metoprolol tartrate (LOPRESSOR) 25 MG tablet Take 1 tablet (25 mg total) by mouth 2 (two) times daily. 180 tablet 2  . pantoprazole (PROTONIX) 40 MG tablet Take 1 tablet (40 mg total) by mouth daily. 90 tablet 3  . simvastatin (ZOCOR) 40 MG tablet Take 1 tablet (40 mg total) by mouth daily. 90 tablet 0   No current facility-administered medications on file prior to visit.     No Known Allergies  Family History  Problem Relation Age of Onset  . Diabetes Father   . Kidney disease Father   . Heart failure Father   . Cancer Mother   . Diabetes Sister   . Diabetes Brother   . Diabetes Sister     BP 130/88 (BP Location: Right Arm, Patient Position: Sitting, Cuff Size: Large)   Pulse 91   Ht _0   (1.676 m)   Wt 216 lb 9.6 oz (98.2 kg)   SpO2 94%   BMI 34.96 kg/m    Review of Systems Denies LOC.  She has left foot pain (no injury).     Objective:   Physical Exam VITAL SIGNS:  See vs page GENERAL: no distress Pulses: dorsalis pedis intact bilat.   MSK: no deformity of the feet CV: no leg edema Skin:  no ulcer on the feet.  normal color and temp on the feet. Neuro: sensation is intact to touch on the feet.   Lab Results  Component Value Date   HGBA1C 7.1 (H) 07/03/2018        Assessment & Plan:  Insulin-requiring type 2 DM, with PN: Based on the pattern of her cbg's, she needs some adjustment in her therapy.   Patient Instructions  check your blood sugar twice a day.  vary the time of day when you check, between before the 3 meals, and at bedtime.  also check if you have symptoms of your blood sugar being too high or too low.  please keep a record of the readings and bring it to your next appointment here (or you can bring the meter itself).  You can write it on any piece of paper.  please call us sooner if your blood sugar goes below 70, or if you have a lot of readings over 200.  Please change the Reg insulin to 3 times a day (just before each meal) 15-20-25 units, and: Please continue the same NPH insulin.   Please come back for a follow-up appointment in 3-4 months.

## 2018-07-16 NOTE — Patient Instructions (Addendum)
check your blood sugar twice a day.  vary the time of day when you check, between before the 3 meals, and at bedtime.  also check if you have symptoms of your blood sugar being too high or too low.  please keep a record of the readings and bring it to your next appointment here (or you can bring the meter itself).  You can write it on any piece of paper.  please call us sooner if your blood sugar goes below 70, or if you have a lot of readings over 200.  Please change the Reg insulin to 3 times a day (just before each meal) 15-20-25 units, and: Please continue the same NPH insulin.   Please come back for a follow-up appointment in 3-4 months.

## 2018-07-17 ENCOUNTER — Telehealth (HOSPITAL_COMMUNITY): Payer: Self-pay | Admitting: *Deleted

## 2018-07-17 NOTE — Telephone Encounter (Signed)
Left message on voicemail per DPR in reference to upcoming appointment scheduled on 07/22/18 at 0730 with detailed instructions given per Myocardial Perfusion Study Information Sheet for the test. LM to arrive 15 minutes early, and that it is imperative to arrive on time for appointment to keep from having the test rescheduled. If you need to cancel or reschedule your appointment, please call the office within 24 hours of your appointment. Failure to do so may result in a cancellation of your appointment, and a $50 no show fee. Phone number given for call back for any questions. Kendre Jacinto, Ranae Palms  No mychart

## 2018-07-18 ENCOUNTER — Ambulatory Visit (HOSPITAL_BASED_OUTPATIENT_CLINIC_OR_DEPARTMENT_OTHER)
Admission: RE | Admit: 2018-07-18 | Discharge: 2018-07-18 | Disposition: A | Discharge status: Home / Self Care | Payer: Medicare Other | Source: Ambulatory Visit | Attending: Cardiology | Admitting: Cardiology

## 2018-07-18 DIAGNOSIS — R0609 Other forms of dyspnea: Secondary | ICD-10-CM | POA: Diagnosis present

## 2018-07-18 DIAGNOSIS — R0789 Other chest pain: Secondary | ICD-10-CM | POA: Diagnosis present

## 2018-07-18 DIAGNOSIS — R002 Palpitations: Secondary | ICD-10-CM | POA: Insufficient documentation

## 2018-07-18 NOTE — Progress Notes (Signed)
  Echocardiogram 2D Echocardiogram has been performed.  Madaleine Simmon T Kelisha Dall 07/18/2018, 10:26 AM

## 2018-07-22 ENCOUNTER — Ambulatory Visit (HOSPITAL_COMMUNITY): Payer: Medicare Other | Attending: Cardiology

## 2018-07-22 VITALS — Ht 66.0 in | Wt 219.0 lb

## 2018-07-22 DIAGNOSIS — R0789 Other chest pain: Secondary | ICD-10-CM | POA: Insufficient documentation

## 2018-07-22 DIAGNOSIS — R0609 Other forms of dyspnea: Secondary | ICD-10-CM | POA: Diagnosis not present

## 2018-07-22 DIAGNOSIS — R002 Palpitations: Secondary | ICD-10-CM | POA: Insufficient documentation

## 2018-07-22 LAB — MYOCARDIAL PERFUSION IMAGING
CSEPPHR: 94 {beats}/min
LV dias vol: 58 mL (ref 46–106)
LV sys vol: 15 mL
Rest HR: 72 {beats}/min
SDS: 0
SRS: 2
SSS: 2
TID: 0.97

## 2018-07-22 MED ORDER — TECHNETIUM TC 99M TETROFOSMIN IV KIT
10.5000 | PACK | Freq: Once | INTRAVENOUS | Status: AC | PRN
  Administered 2018-07-22: 10.5 via INTRAVENOUS
  Filled 2018-07-22: qty 11

## 2018-07-22 MED ORDER — REGADENOSON 0.4 MG/5ML IV SOLN
0.4000 mg | Freq: Once | INTRAVENOUS | Status: AC
  Administered 2018-07-22: 0.4 mg via INTRAVENOUS

## 2018-07-22 MED ORDER — TECHNETIUM TC 99M TETROFOSMIN IV KIT
32.4000 | PACK | Freq: Once | INTRAVENOUS | Status: AC | PRN
  Administered 2018-07-22: 32.4 via INTRAVENOUS
  Filled 2018-07-22: qty 33

## 2018-07-24 ENCOUNTER — Other Ambulatory Visit: Payer: Self-pay | Admitting: Podiatry

## 2018-07-24 ENCOUNTER — Ambulatory Visit: Payer: Medicare Other | Admitting: Podiatry

## 2018-07-24 ENCOUNTER — Encounter: Payer: Self-pay | Admitting: Podiatry

## 2018-07-24 ENCOUNTER — Other Ambulatory Visit: Payer: Self-pay | Admitting: Family Medicine

## 2018-07-24 ENCOUNTER — Ambulatory Visit (INDEPENDENT_AMBULATORY_CARE_PROVIDER_SITE_OTHER): Payer: Medicare Other

## 2018-07-24 DIAGNOSIS — M7672 Peroneal tendinitis, left leg: Secondary | ICD-10-CM

## 2018-07-24 DIAGNOSIS — M778 Other enthesopathies, not elsewhere classified: Secondary | ICD-10-CM

## 2018-07-24 DIAGNOSIS — E785 Hyperlipidemia, unspecified: Secondary | ICD-10-CM

## 2018-07-24 DIAGNOSIS — M779 Enthesopathy, unspecified: Secondary | ICD-10-CM | POA: Diagnosis not present

## 2018-07-24 MED ORDER — MELOXICAM 15 MG PO TABS: 15.0000 mg | ORAL_TABLET | Freq: Every day | ORAL | 1 refills | Status: AC

## 2018-07-25 ENCOUNTER — Telehealth: Payer: Self-pay | Admitting: Endocrinology

## 2018-07-25 ENCOUNTER — Telehealth: Payer: Self-pay

## 2018-07-25 ENCOUNTER — Other Ambulatory Visit: Payer: Self-pay

## 2018-07-25 MED ORDER — INSULIN REGULAR HUMAN 100 UNIT/ML IJ SOLN: INTRAMUSCULAR | 2 refills | Status: DC

## 2018-07-25 NOTE — Telephone Encounter (Signed)
Patient re: patient is trying to lower cost of medication. [employer] will be sending forms to [facility] for approval (Synthroid-not generic, Humalin N & Humalin R). Patient states she cannot take generic medication.

## 2018-07-25 NOTE — Telephone Encounter (Signed)
Terri Gray  Key: [id number] PA Case ID: [id number]  Need help? Call us at [phone]  Status: Additional Information Required  Drug: HumuLIN R 100UNIT/ML solution  Form: [employer] Electronic Prior Authorization Form (2017 NCPDP)

## 2018-07-26 NOTE — Telephone Encounter (Signed)
Received notification from [employer] that PA for Humulin R did not require a PA at this time, covered drug in plan's list. Document has been labeled and placed in scan file for HIM and for our future reference.

## 2018-07-29 NOTE — Telephone Encounter (Signed)
Called pt to make her aware of denial. States she is aware. Further added, denial for Humulin N was directly related to her request to change from Tier 3 to a Tier 2 to lower her out of pocket expense. Requesting to keep Rx as Humulin N and will pay the higher cost. No changes were made to this pt current insulin regimen at her request.

## 2018-07-29 NOTE — Progress Notes (Signed)
   HPI: 63 year old female presenting today as a new patient with a chief complaint of sharp pain to the left dorsolateral foot that began a few months ago. She states the pain is so severe at times that she can barely walk. Walking and bearing weight increases the pain. She has not done anything for treatment. Patient is here for further evaluation and treatment.   Past Medical History:  Diagnosis Date  . Allergy   . Arthritis   . Chicken pox   . Colon polyps   . Diabetes mellitus without complication ([place])   . GERD (gastroesophageal reflux disease)   . Hyperlipidemia   . Hypertension   . Migraines      Physical Exam: General: The patient is alert and oriented x3 in no acute distress.  Dermatology: Skin is warm, dry and supple bilateral lower extremities. Negative for open lesions or macerations.  Vascular: Palpable pedal pulses bilaterally. No edema or erythema noted. Capillary refill within normal limits.  Neurological: Epicritic and protective threshold grossly intact bilaterally.   Musculoskeletal Exam: Pain with palpation to the insertion of the peroneal tendon of the left foot as well as to the left midfoot. Range of motion within normal limits to all pedal and ankle joints bilateral. Muscle strength 5/5 in all groups bilateral.   Radiographic Exam:  Normal osseous mineralization. Joint spaces preserved. No fracture/dislocation/boney destruction.    Assessment: 1. Insertional peroneal tendinitis left 2. Midfoot capsulitis left    Plan of Care:  1. Patient evaluated. X-Rays reviewed.  2. Injection of 0.5 mLs Celestone Soluspan injected into the insertion of the peroneal tendon sheath.  3. Injection of 0.5 mLs Celestone Soluspan injected into the left midfoot.  4. Prescription for Meloxicam provided to patient. 5. Recommended good shoe gear.  6. Recommended OTC insoles from [employer].  7. Return to clinic in 4 weeks.       Edrick Kins, DPM [facility]  Dr. Edrick Kins, DPM    [address], [place] [address]                Office [phone]  Fax [phone]

## 2018-07-29 NOTE — Telephone Encounter (Signed)
48 hours after receipt of approval for Humulin N, Medicare faxed documentation denying medication. Placed documents on Dr. Cordelia Pen desk for review.

## 2018-08-19 ENCOUNTER — Ambulatory Visit: Payer: Medicare Other | Admitting: Cardiology

## 2018-08-21 ENCOUNTER — Ambulatory Visit: Payer: Medicare Other | Admitting: Podiatry

## 2018-10-09 ENCOUNTER — Telehealth: Payer: Self-pay | Admitting: Family Medicine

## 2018-10-09 NOTE — Telephone Encounter (Signed)
Copied from [place] [phone]. Topic: Quick Communication - See Telephone Encounter >> Oct 09, 2018 11:31 AM Blase Mess A wrote: CRM for notification. See Telephone encounter for: 10/09/18.  Patient is calling back.  She states she missed a call Please advise thank you  [phone] (M)

## 2018-10-09 NOTE — Telephone Encounter (Signed)
Spoke with pt stated that she thought our office called her, pt states that she does not need anything from the office.

## 2018-10-11 ENCOUNTER — Encounter: Payer: Self-pay | Admitting: Endocrinology

## 2018-10-15 ENCOUNTER — Other Ambulatory Visit: Payer: Self-pay

## 2018-10-15 ENCOUNTER — Ambulatory Visit (INDEPENDENT_AMBULATORY_CARE_PROVIDER_SITE_OTHER): Payer: Medicare Other | Admitting: Endocrinology

## 2018-10-15 DIAGNOSIS — E119 Type 2 diabetes mellitus without complications: Secondary | ICD-10-CM

## 2018-10-15 DIAGNOSIS — Z794 Long term (current) use of insulin: Secondary | ICD-10-CM | POA: Diagnosis not present

## 2018-10-15 DIAGNOSIS — K219 Gastro-esophageal reflux disease without esophagitis: Secondary | ICD-10-CM

## 2018-10-15 DIAGNOSIS — E1142 Type 2 diabetes mellitus with diabetic polyneuropathy: Secondary | ICD-10-CM

## 2018-10-15 DIAGNOSIS — I10 Essential (primary) hypertension: Secondary | ICD-10-CM

## 2018-10-15 MED ORDER — INSULIN GLARGINE 100 UNIT/ML SOLOSTAR PEN: 90.0000 [IU] | PEN_INJECTOR | SUBCUTANEOUS | 99 refills | Status: DC

## 2018-10-15 MED ORDER — METFORMIN HCL 1000 MG PO TABS: 1000.0000 mg | ORAL_TABLET | Freq: Two times a day (BID) | ORAL | 3 refills | Status: DC

## 2018-10-15 MED ORDER — METOPROLOL TARTRATE 25 MG PO TABS: 25.0000 mg | ORAL_TABLET | Freq: Two times a day (BID) | ORAL | 2 refills | Status: DC

## 2018-10-15 MED ORDER — PANTOPRAZOLE SODIUM 40 MG PO TBEC: 40.0000 mg | DELAYED_RELEASE_TABLET | Freq: Every day | ORAL | 3 refills | Status: DC

## 2018-10-15 NOTE — Progress Notes (Signed)
Subjective:    Patient ID: Terri Gray, female    DOB: 1956/05/04, 63 y.o.   MRN: [id number]  HPI telehealth visit today via phone x 13 minutes.   Alternatives to telehealth are presented to this patient, and the patient agrees to the telehealth visit.   Pt is advised of the cost of the visit, and agrees to this, also.   Patient is at home, and I am at the office.   Persons attending the telehealth visit: the patient, wife, and I Pt returns for f/u of diabetes mellitus: DM type: Insulin-requiring type 2.  Dx'ed: 4431 Complications: polyneuropathy Therapy: insulin since 2012 GDM: never DKA: never Severe hypoglycemia: never.  Pancreatitis: never Pancreatic imaging: never Other: she takes multiple daily injections; she takes human insulin, due to cost.   Interval History: pt says cbg's vary widely.  She seldom has hypoglycemia sxs, and these episodes are mild.  She recently had a steroid injection, into the left foot.  Thi increase cbg's for a few days.  She never misses the insulin, but she wants to d/c multiple daily injections.   She also has multinodular goiter (dx'ed 2018; she had bx twice (benign; F/u US in 2019 showed none of the nodules met imaging criteria for follow-up); TSH is borderline low).   Past Medical History:  Diagnosis Date  . Allergy   . Arthritis   . Chicken pox   . Colon polyps   . Diabetes mellitus without complication ([place])   . GERD (gastroesophageal reflux disease)   . Hyperlipidemia   . Hypertension   . Migraines     Past Surgical History:  Procedure Laterality Date  . REPLACEMENT TOTAL KNEE Left 2016    Social History   Socioeconomic History  . Marital status: Married    Spouse name: Not on file  . Number of children: Not on file  . Years of education: Not on file  . Highest education level: Not on file  Occupational History  . Not on file  Social Needs  . Financial resource strain: Not on file  . Food insecurity:    Worry: Not  on file    Inability: Not on file  . Transportation needs:    Medical: Not on file    Non-medical: Not on file  Tobacco Use  . Smoking status: Never Smoker  . Smokeless tobacco: Never Used  Substance and Sexual Activity  . Alcohol use: No  . Drug use: No  . Sexual activity: Not on file  Lifestyle  . Physical activity:    Days per week: Not on file    Minutes per session: Not on file  . Stress: Not on file  Relationships  . Social connections:    Talks on phone: Not on file    Gets together: Not on file    Attends religious service: Not on file    Active member of club or organization: Not on file    Attends meetings of clubs or organizations: Not on file    Relationship status: Not on file  . Intimate partner violence:    Fear of current or ex partner: Not on file    Emotionally abused: Not on file    Physically abused: Not on file    Forced sexual activity: Not on file  Other Topics Concern  . Not on file  Social History Narrative  . Not on file    Current Outpatient Medications on File Prior to Visit  Medication Sig Dispense Refill  .  cyclobenzaprine (FLEXERIL) 10 MG tablet Take 1 tablet (10 mg total) by mouth as needed for muscle spasms. 90 tablet 5  . glucose blood (TRUE METRIX BLOOD GLUCOSE TEST) test strip 1 each by Other route 4 (four) times daily as needed for other. And lancets 2/day 360 each 3  . levothyroxine (SYNTHROID) 75 MCG tablet Take 1 tablet (75 mcg total) by mouth daily before breakfast. 90 tablet 3  . simvastatin (ZOCOR) 40 MG tablet Take 1 tablet by mouth once daily 90 tablet 0   No current facility-administered medications on file prior to visit.     No Known Allergies  Family History  Problem Relation Age of Onset  . Diabetes Father   . Kidney disease Father   . Heart failure Father   . Cancer Mother   . Diabetes Sister   . Diabetes Brother   . Diabetes Sister       Review of Systems Denies LOC    Objective:   Physical Exam      Lab Results  Component Value Date   CREATININE 0.85 07/03/2018   BUN 9 07/03/2018   NA 137 07/03/2018   K 4.7 07/03/2018   CL 101 07/03/2018   CO2 28 07/03/2018     Lab Results  Component Value Date   HGBA1C 7.1 (H) 07/03/2018     Lab Results  Component Value Date   TSH 0.64 07/03/2018      Assessment & Plan:  Insulin-requiring type 2 DM: uncertain glycemic control.  We discussed.  I advised her to continue multiple daily injections, but she wants to change to qd insulin.    Patient Instructions  check your blood sugar twice a day.  vary the time of day when you check, between before the 3 meals, and at bedtime.  also check if you have symptoms of your blood sugar being too high or too low.  please keep a record of the readings and bring it to your next appointment here (or you can bring the meter itself).  You can write it on any piece of paper.  please call us sooner if your blood sugar goes below 70, or if you have a lot of readings over 200.  Please change both insulins to lantus, 90 units each morning.   Please continue the same metformin.   Please have a follow-up appointment in 1 month.

## 2018-10-15 NOTE — Patient Instructions (Addendum)
check your blood sugar twice a day.  vary the time of day when you check, between before the 3 meals, and at bedtime.  also check if you have symptoms of your blood sugar being too high or too low.  please keep a record of the readings and bring it to your next appointment here (or you can bring the meter itself).  You can write it on any piece of paper.  please call us sooner if your blood sugar goes below 70, or if you have a lot of readings over 200.  Please change both insulins to lantus, 90 units each morning.   Please continue the same metformin.   Please have a follow-up appointment in 1 month.

## 2018-10-23 ENCOUNTER — Other Ambulatory Visit: Payer: Self-pay | Admitting: Family Medicine

## 2018-10-23 DIAGNOSIS — E785 Hyperlipidemia, unspecified: Secondary | ICD-10-CM

## 2018-11-20 ENCOUNTER — Other Ambulatory Visit: Payer: Self-pay | Admitting: Family Medicine

## 2018-11-20 DIAGNOSIS — K219 Gastro-esophageal reflux disease without esophagitis: Secondary | ICD-10-CM

## 2019-01-30 ENCOUNTER — Other Ambulatory Visit: Payer: Self-pay | Admitting: Family Medicine

## 2019-01-30 DIAGNOSIS — I10 Essential (primary) hypertension: Secondary | ICD-10-CM

## 2019-01-30 DIAGNOSIS — E785 Hyperlipidemia, unspecified: Secondary | ICD-10-CM

## 2019-02-12 ENCOUNTER — Telehealth: Payer: Self-pay

## 2019-02-12 NOTE — Telephone Encounter (Signed)
Copied from [place] [phone]. Topic: Appointment Scheduling - Scheduling Inquiry for Clinic >> Jan 30, 2019 11:51 AM Lennox Solders wrote: Reason for CRM: April with [facility] is calling to schedule pt for AWV . Pt is only 62 and per April this is on pt benefit plan. April can be reached at [phone] >> Jan 30, 2019  1:10 PM Cox, Melburn Hake, CMA wrote: Terri Gray complete AWV?

## 2019-02-18 ENCOUNTER — Other Ambulatory Visit: Payer: Self-pay

## 2019-02-18 ENCOUNTER — Ambulatory Visit (INDEPENDENT_AMBULATORY_CARE_PROVIDER_SITE_OTHER): Payer: Medicare Other

## 2019-02-18 DIAGNOSIS — Z23 Encounter for immunization: Secondary | ICD-10-CM | POA: Diagnosis not present

## 2019-02-21 ENCOUNTER — Other Ambulatory Visit: Payer: Self-pay

## 2019-02-21 ENCOUNTER — Telehealth (INDEPENDENT_AMBULATORY_CARE_PROVIDER_SITE_OTHER): Payer: Medicare Other | Admitting: Family Medicine

## 2019-02-21 DIAGNOSIS — K219 Gastro-esophageal reflux disease without esophagitis: Secondary | ICD-10-CM | POA: Diagnosis not present

## 2019-02-21 DIAGNOSIS — M26609 Unspecified temporomandibular joint disorder, unspecified side: Secondary | ICD-10-CM

## 2019-02-21 DIAGNOSIS — E119 Type 2 diabetes mellitus without complications: Secondary | ICD-10-CM | POA: Diagnosis not present

## 2019-02-21 DIAGNOSIS — E039 Hypothyroidism, unspecified: Secondary | ICD-10-CM

## 2019-02-21 DIAGNOSIS — E785 Hyperlipidemia, unspecified: Secondary | ICD-10-CM

## 2019-02-21 DIAGNOSIS — I10 Essential (primary) hypertension: Secondary | ICD-10-CM | POA: Diagnosis not present

## 2019-02-21 DIAGNOSIS — Z794 Long term (current) use of insulin: Secondary | ICD-10-CM

## 2019-02-21 MED ORDER — LEVOTHYROXINE SODIUM 75 MCG PO TABS: 75.0000 ug | ORAL_TABLET | Freq: Every day | ORAL | 3 refills | Status: DC

## 2019-02-21 MED ORDER — METFORMIN HCL 1000 MG PO TABS: 1000.0000 mg | ORAL_TABLET | Freq: Two times a day (BID) | ORAL | 3 refills | Status: DC

## 2019-02-21 MED ORDER — PANTOPRAZOLE SODIUM 40 MG PO TBEC: 40.0000 mg | DELAYED_RELEASE_TABLET | Freq: Every day | ORAL | 3 refills | Status: DC

## 2019-02-21 MED ORDER — CYCLOBENZAPRINE HCL 10 MG PO TABS: 10.0000 mg | ORAL_TABLET | ORAL | 5 refills | Status: DC | PRN

## 2019-02-21 MED ORDER — METOPROLOL TARTRATE 25 MG PO TABS: 25.0000 mg | ORAL_TABLET | Freq: Two times a day (BID) | ORAL | 3 refills | Status: DC

## 2019-02-21 MED ORDER — SIMVASTATIN 40 MG PO TABS: 40.0000 mg | ORAL_TABLET | Freq: Every day | ORAL | 3 refills | Status: DC

## 2019-02-21 NOTE — Progress Notes (Signed)
Virtual Visit via Video Note  I connected with Terri Gray on 02/21/19 at  9:30 AM EDT by a video enabled telemedicine application 2/2 [id number] pandemic and verified that I am speaking with the correct person using two identifiers.  Location patient: home Location provider:work or home office Persons participating in the virtual visit: patient, provider  I discussed the limitations of evaluation and management by telemedicine and the availability of in person appointments. The patient expressed understanding and agreed to proceed.   HPI: Pt has been doing well.  She is being seen for f/u, refills on meds, and needs labs.  Since last OFV, pt had f/u with Cardiology for palpitations.  Work up was negative.  Pt is having palpitations infrequently.  Does not notice an a/w food, caffeine, or stress.  Pt still having foot pain.  She thinks the feeling is related to nerve pain.  Pt has seen the Podiatrist.  She was given a cortisone injection which raised her blood sugar and made her feel bad.  Pt tried gabapentin in the past, but it caused a  stomach ache.  Also tried tramadol.    ROS: See pertinent positives and negatives per HPI.  Past Medical History:  Diagnosis Date  . Allergy   . Arthritis   . Chicken pox   . Colon polyps   . Diabetes mellitus without complication ([place])   . GERD (gastroesophageal reflux disease)   . Hyperlipidemia   . Hypertension   . Migraines     Past Surgical History:  Procedure Laterality Date  . REPLACEMENT TOTAL KNEE Left 2016    Family History  Problem Relation Age of Onset  . Diabetes Father   . Kidney disease Father   . Heart failure Father   . Cancer Mother   . Diabetes Sister   . Diabetes Brother   . Diabetes Sister      Current Outpatient Medications:  .  cyclobenzaprine (FLEXERIL) 10 MG tablet, Take 1 tablet (10 mg total) by mouth as needed for muscle spasms., Disp: 90 tablet, Rfl: 5 .  glucose blood (TRUE METRIX BLOOD GLUCOSE TEST)  test strip, 1 each by Other route 4 (four) times daily as needed for other. And lancets 2/day, Disp: 360 each, Rfl: 3 .  Insulin Glargine (LANTUS SOLOSTAR) 100 UNIT/ML Solostar Pen, Inject 90 Units into the skin every morning., Disp: 35 pen, Rfl: PRN .  levothyroxine (SYNTHROID) 75 MCG tablet, Take 1 tablet (75 mcg total) by mouth daily before breakfast., Disp: 90 tablet, Rfl: 3 .  metFORMIN (GLUCOPHAGE) 1000 MG tablet, Take 1 tablet (1,000 mg total) by mouth 2 (two) times daily with a meal., Disp: 180 tablet, Rfl: 3 .  metoprolol tartrate (LOPRESSOR) 25 MG tablet, Take 1 tablet by mouth twice daily, Disp: 180 tablet, Rfl: 0 .  pantoprazole (PROTONIX) 40 MG tablet, Take 1 tablet by mouth once daily, Disp: 90 tablet, Rfl: 0 .  simvastatin (ZOCOR) 40 MG tablet, Take 1 tablet by mouth once daily, Disp: 90 tablet, Rfl: 0  EXAM:  VITALS per patient if applicable:  GENERAL: alert, oriented, appears well and in no acute distress  HEENT: atraumatic, conjunctiva clear, no obvious abnormalities on inspection of external nose and ears  NECK: normal movements of the head and neck  LUNGS: on inspection no signs of respiratory distress, breathing rate appears normal, no obvious gross SOB, gasping or wheezing  CV: no obvious cyanosis  MS: moves all visible extremities without noticeable abnormality  PSYCH/NEURO: pleasant and cooperative,  no obvious depression or anxiety, speech and thought processing grossly intact  ASSESSMENT AND PLAN:  Discussed the following assessment and plan:  Acquired hypothyroidism  -stable -last TSH 0.64 on 07/03/18 -continue f/u with Endo - Plan: levothyroxine (SYNTHROID) 75 MCG tablet, TSH  TMJ (temporomandibular joint disorder)  - Plan: cyclobenzaprine (FLEXERIL) 10 MG tablet  Controlled type 2 diabetes mellitus without complication, with long-term current use of insulin ([place])  -continue f/u with Endocrinology -will look into nonpharm options for foot pain -  Plan: metFORMIN (GLUCOPHAGE) 1000 MG tablet, Hemoglobin A1c, Vitamin B12  Essential hypertension  -continue lifestyle modifications - Plan: metoprolol tartrate (LOPRESSOR) 25 MG tablet, Basic metabolic panel  Gastroesophageal reflux disease without esophagitis  - Plan: pantoprazole (PROTONIX) 40 MG tablet, CBC (no diff), Vitamin B12  Hyperlipidemia, unspecified hyperlipidemia type  -discussed taking simvastatin every few days vs d/c'ing the med as pt with muscle cramps. -potassium 4.7 on 07/03/18 - Plan: simvastatin (ZOCOR) 40 MG tablet, Lipid panel  Pt to have labs next wk.  F/u in 1 month for pain in feet.   I discussed the assessment and treatment plan with the patient. The patient was provided an opportunity to ask questions and all were answered. The patient agreed with the plan and demonstrated an understanding of the instructions.   The patient was advised to call back or seek an in-person evaluation if the symptoms worsen or if the condition fails to improve as anticipated.   [name], MD

## 2019-02-28 ENCOUNTER — Telehealth: Payer: Self-pay | Admitting: Family Medicine

## 2019-02-28 NOTE — Telephone Encounter (Signed)
Copied from [place] [phone]. Topic: General - Other >> Feb 28, 2019  8:58 AM Keene Breath wrote: Reason for CRM: Pharmacy called to get the approval to change the manufacturer of the medication, levothyroxine (SYNTHROID) 75 MCG tablet, for the patient.  Please advise and call to discuss.  CB# [phone]

## 2019-03-03 ENCOUNTER — Other Ambulatory Visit: Payer: Self-pay | Admitting: Family Medicine

## 2019-03-03 NOTE — Telephone Encounter (Signed)
LVM for pt to call the office regarding her Amlodipine Rx since its on pt list of medications

## 2019-03-05 NOTE — Telephone Encounter (Signed)
Copied from [place] [phone]. Topic: General - Other >> Mar 05, 2019  4:12 PM Pauline Good wrote: Reason for CRM: pharmacy has question for the medication  Levothyroxine  before they can give it to the pt

## 2019-03-07 ENCOUNTER — Telehealth: Payer: Self-pay | Admitting: *Deleted

## 2019-03-07 NOTE — Telephone Encounter (Signed)
Spoke with pharmacy states that Levothyroxine have been changed to Uthyiroxine with no dye tablets, ok to change Rx

## 2019-03-07 NOTE — Telephone Encounter (Signed)
Copied from [place] [phone]. Topic: Quick Sport and exercise psychologist Patient (Clinic Use ONLY) >> Mar 07, 2019 10:37 AM Pauline Good wrote: Reason for CRM: pt returning call to office

## 2019-03-07 NOTE — Telephone Encounter (Signed)
Pharm is calling checking on the below request

## 2019-03-08 NOTE — Telephone Encounter (Signed)
That's fine, if pt is ok with it.

## 2019-03-10 NOTE — Telephone Encounter (Signed)
Spoke with [place] states that pt chose to stay on the same brand of Synthroid. Pt picked up refill from the pharmacy today

## 2019-03-10 NOTE — Telephone Encounter (Signed)
Spoke with pt pharmacy states that pt picked up her synthroid refill. Nothing further needed

## 2019-03-11 ENCOUNTER — Other Ambulatory Visit (INDEPENDENT_AMBULATORY_CARE_PROVIDER_SITE_OTHER): Payer: Medicare Other

## 2019-03-11 ENCOUNTER — Other Ambulatory Visit: Payer: Self-pay

## 2019-03-11 DIAGNOSIS — Z794 Long term (current) use of insulin: Secondary | ICD-10-CM | POA: Diagnosis not present

## 2019-03-11 DIAGNOSIS — E785 Hyperlipidemia, unspecified: Secondary | ICD-10-CM | POA: Diagnosis not present

## 2019-03-11 DIAGNOSIS — E039 Hypothyroidism, unspecified: Secondary | ICD-10-CM

## 2019-03-11 DIAGNOSIS — E119 Type 2 diabetes mellitus without complications: Secondary | ICD-10-CM | POA: Diagnosis not present

## 2019-03-11 DIAGNOSIS — K219 Gastro-esophageal reflux disease without esophagitis: Secondary | ICD-10-CM | POA: Diagnosis not present

## 2019-03-11 DIAGNOSIS — I10 Essential (primary) hypertension: Secondary | ICD-10-CM | POA: Diagnosis not present

## 2019-03-11 LAB — CBC
HCT: 34.8 % — ABNORMAL LOW (ref 36.0–46.0)
Hemoglobin: 11.4 g/dL — ABNORMAL LOW (ref 12.0–15.0)
MCHC: 32.7 g/dL (ref 30.0–36.0)
MCV: 78.5 fl (ref 78.0–100.0)
Platelets: 323 10*3/uL (ref 150.0–400.0)
RBC: 4.43 Mil/uL (ref 3.87–5.11)
RDW: 15.9 % — ABNORMAL HIGH (ref 11.5–15.5)
WBC: 7.8 10*3/uL (ref 4.0–10.5)

## 2019-03-11 LAB — BASIC METABOLIC PANEL
BUN: 11 mg/dL (ref 6–23)
CO2: 29 mEq/L (ref 19–32)
Calcium: 9 mg/dL (ref 8.4–10.5)
Chloride: 103 mEq/L (ref 96–112)
Creatinine, Ser: 0.85 mg/dL (ref 0.40–1.20)
GFR: 81.47 mL/min (ref >60.00)
Glucose, Bld: 105 mg/dL — ABNORMAL HIGH (ref 70–99)
Potassium: 4.4 mEq/L (ref 3.5–5.1)
Sodium: 139 mEq/L (ref 135–145)

## 2019-03-11 LAB — LIPID PANEL
Cholesterol: 134 mg/dL (ref 0–200)
HDL: 36.3 mg/dL — ABNORMAL LOW (ref >39.00)
LDL Cholesterol: 63 mg/dL (ref 0–99)
NonHDL: 97.77
Total CHOL/HDL Ratio: 4
Triglycerides: 174 mg/dL — ABNORMAL HIGH (ref 0.0–149.0)
VLDL: 34.8 mg/dL (ref 0.0–40.0)

## 2019-03-11 LAB — VITAMIN B12: Vitamin B-12: 225 pg/mL (ref 211–911)

## 2019-03-11 LAB — HEMOGLOBIN A1C: Hgb A1c MFr Bld: 6.8 % — ABNORMAL HIGH (ref 4.6–6.5)

## 2019-03-11 LAB — TSH: TSH: 0.97 u[IU]/mL (ref 0.35–4.50)

## 2019-03-14 NOTE — Telephone Encounter (Signed)
Copied from [place] [phone]. Topic: Appointment Scheduling - Scheduling Inquiry for Clinic >> Mar 14, 2019 11:59 AM Yvette Rack wrote: Reason for CRM: Pt stated she received a call requesting that she call back for B-12 injection. Pt requests call back

## 2019-03-17 NOTE — Telephone Encounter (Signed)
Pt has been scheduled for her first B12 injection on 02/25/2019

## 2019-03-28 ENCOUNTER — Ambulatory Visit: Payer: Medicare Other

## 2019-04-02 ENCOUNTER — Other Ambulatory Visit: Payer: Self-pay

## 2019-04-02 ENCOUNTER — Ambulatory Visit (INDEPENDENT_AMBULATORY_CARE_PROVIDER_SITE_OTHER): Payer: Medicare Other

## 2019-04-02 DIAGNOSIS — E538 Deficiency of other specified B group vitamins: Secondary | ICD-10-CM

## 2019-04-02 MED ORDER — CYANOCOBALAMIN 1000 MCG/ML IJ SOLN
1000.0000 ug | Freq: Once | INTRAMUSCULAR | Status: AC
  Administered 2019-04-02: 1000 ug via INTRAMUSCULAR

## 2019-04-02 NOTE — Progress Notes (Signed)
Per orders of Dr.Banks  injection ofCyanocobalamin 1,000 mcg/mL, given by Wyvonne Lenz. Patient tolerated injection well.

## 2019-04-30 ENCOUNTER — Other Ambulatory Visit: Payer: Self-pay

## 2019-04-30 ENCOUNTER — Ambulatory Visit (INDEPENDENT_AMBULATORY_CARE_PROVIDER_SITE_OTHER): Payer: Medicare Other | Admitting: *Deleted

## 2019-04-30 DIAGNOSIS — E538 Deficiency of other specified B group vitamins: Secondary | ICD-10-CM | POA: Diagnosis not present

## 2019-04-30 MED ORDER — CYANOCOBALAMIN 1000 MCG/ML IJ SOLN
1000.0000 ug | Freq: Once | INTRAMUSCULAR | Status: AC
  Administered 2019-04-30: 10:00:00 1000 ug via INTRAMUSCULAR

## 2019-04-30 NOTE — Progress Notes (Signed)
Per orders of Dr. Volanda Napoleon, injection of Vitamin B 12  given by Zacarias Pontes. Patient tolerated injection well.

## 2019-05-28 ENCOUNTER — Telehealth: Payer: Self-pay

## 2019-05-28 NOTE — Telephone Encounter (Signed)
Per Dr. Loanne Drilling, unable to refill Lantus Solostar without an appt. Routing this message to the front desk for scheduling purposes.

## 2019-05-30 ENCOUNTER — Other Ambulatory Visit: Payer: Self-pay

## 2019-05-30 DIAGNOSIS — Z794 Long term (current) use of insulin: Secondary | ICD-10-CM

## 2019-05-30 DIAGNOSIS — E1142 Type 2 diabetes mellitus with diabetic polyneuropathy: Secondary | ICD-10-CM

## 2019-05-30 MED ORDER — LANTUS SOLOSTAR 100 UNIT/ML ~~LOC~~ SOPN: 90.0000 [IU] | PEN_INJECTOR | SUBCUTANEOUS | 0 refills | Status: DC

## 2019-05-30 NOTE — Telephone Encounter (Signed)
Disp Refills Start End   Insulin Glargine (LANTUS SOLOSTAR) 100 UNIT/ML Solostar Pen 27 mL 0 05/30/2019    Sig - Route: Inject 90 Units into the skin every morning. - Subcutaneous   Sent to pharmacy as: Insulin Glargine (LANTUS SOLOSTAR) 100 UNIT/ML Solostar Pen   E-Prescribing Status: Receipt confirmed by pharmacy (05/30/2019  3:28 PM EST)

## 2019-05-30 NOTE — Telephone Encounter (Signed)
Patient is scheduled for MD to Patient appointment on 06/03/19 at 2:30 p.m. (Patient does not want to come into office until after she has been vaccinated for Covid 19)

## 2019-06-03 ENCOUNTER — Ambulatory Visit (INDEPENDENT_AMBULATORY_CARE_PROVIDER_SITE_OTHER): Payer: Medicare Other | Admitting: Endocrinology

## 2019-06-03 ENCOUNTER — Other Ambulatory Visit: Payer: Self-pay

## 2019-06-03 ENCOUNTER — Encounter: Payer: Self-pay | Admitting: Endocrinology

## 2019-06-03 DIAGNOSIS — E039 Hypothyroidism, unspecified: Secondary | ICD-10-CM | POA: Diagnosis not present

## 2019-06-03 DIAGNOSIS — Z794 Long term (current) use of insulin: Secondary | ICD-10-CM | POA: Diagnosis not present

## 2019-06-03 DIAGNOSIS — E119 Type 2 diabetes mellitus without complications: Secondary | ICD-10-CM

## 2019-06-03 DIAGNOSIS — E1142 Type 2 diabetes mellitus with diabetic polyneuropathy: Secondary | ICD-10-CM

## 2019-06-03 MED ORDER — LANTUS SOLOSTAR 100 UNIT/ML ~~LOC~~ SOPN: 85.0000 [IU] | PEN_INJECTOR | SUBCUTANEOUS | 3 refills | Status: DC

## 2019-06-03 MED ORDER — LEVOTHYROXINE SODIUM 75 MCG PO TABS: 75.0000 ug | ORAL_TABLET | Freq: Every day | ORAL | 3 refills | Status: DC

## 2019-06-03 MED ORDER — METFORMIN HCL 1000 MG PO TABS: 1000.0000 mg | ORAL_TABLET | Freq: Two times a day (BID) | ORAL | 3 refills | Status: DC

## 2019-06-03 NOTE — Progress Notes (Signed)
Subjective:    Patient ID: Terri Gray, female    DOB: 06-22-1955, 64 y.o.   MRN: [id number]  HPI telehealth visit today via phone x 11 minutes.   Alternatives to telehealth are presented to this patient, and the patient agrees to the telehealth visit.   Pt is advised of the cost of the visit, and agrees to this, also.   Patient is at home, and I am at the office.   Persons attending the telehealth visit: the patient, wife, and I Pt returns for f/u of diabetes mellitus: DM type: Insulin-requiring type 2.  Dx'ed: 3846 Complications: PN Therapy: insulin since 2012 GDM: never DKA: never Severe hypoglycemia: never.  Pancreatitis: never Pancreatic imaging: never Other: she requested to d/c multiple daily injections; she takes human insulin, due to cost.   Interval History: pt says cbg's vary from 90-325.  It is in general higher as the day goes on.  No recent steroids.  She also has multinodular goiter (dx'ed 2018; she had bx twice (benign; F/u US in 2019 showed none of the nodules met imaging criteria for follow-up); TSH is borderline low.   Past Medical History:  Diagnosis Date  . Allergy   . Arthritis   . Chicken pox   . Colon polyps   . Diabetes mellitus without complication ([place])   . GERD (gastroesophageal reflux disease)   . Hyperlipidemia   . Hypertension   . Migraines     Past Surgical History:  Procedure Laterality Date  . REPLACEMENT TOTAL KNEE Left 2016    Social History   Socioeconomic History  . Marital status: Married    Spouse name: Not on file  . Number of children: Not on file  . Years of education: Not on file  . Highest education level: Not on file  Occupational History  . Not on file  Tobacco Use  . Smoking status: Never Smoker  . Smokeless tobacco: Never Used  Substance and Sexual Activity  . Alcohol use: No  . Drug use: No  . Sexual activity: Not on file  Other Topics Concern  . Not on file  Social History Narrative  . Not on file    Social Determinants of Health   Financial Resource Strain:   . Difficulty of Paying Living Expenses: Not on file  Food Insecurity:   . Worried About Charity fundraiser in the Last Year: Not on file  . Ran Out of Food in the Last Year: Not on file  Transportation Needs:   . Lack of Transportation (Medical): Not on file  . Lack of Transportation (Non-Medical): Not on file  Physical Activity:   . Days of Exercise per Week: Not on file  . Minutes of Exercise per Session: Not on file  Stress:   . Feeling of Stress : Not on file  Social Connections:   . Frequency of Communication with Friends and Family: Not on file  . Frequency of Social Gatherings with Friends and Family: Not on file  . Attends Religious Services: Not on file  . Active Member of Clubs or Organizations: Not on file  . Attends Archivist Meetings: Not on file  . Marital Status: Not on file  Intimate Partner Violence:   . Fear of Current or Ex-Partner: Not on file  . Emotionally Abused: Not on file  . Physically Abused: Not on file  . Sexually Abused: Not on file    Current Outpatient Medications on File Prior to Visit  Medication  Sig Dispense Refill  . cyclobenzaprine (FLEXERIL) 10 MG tablet Take 1 tablet (10 mg total) by mouth as needed for muscle spasms. 90 tablet 5  . glucose blood (TRUE METRIX BLOOD GLUCOSE TEST) test strip 1 each by Other route 4 (four) times daily as needed for other. And lancets 2/day 360 each 3  . metoprolol tartrate (LOPRESSOR) 25 MG tablet Take 1 tablet (25 mg total) by mouth 2 (two) times daily. 180 tablet 3  . pantoprazole (PROTONIX) 40 MG tablet Take 1 tablet (40 mg total) by mouth daily. 90 tablet 3  . simvastatin (ZOCOR) 40 MG tablet Take 1 tablet (40 mg total) by mouth daily. 90 tablet 3   No current facility-administered medications on file prior to visit.    Allergies  Allergen Reactions  . Gabapentin     Stomach ache    Family History  Problem Relation Age of  Onset  . Diabetes Father   . Kidney disease Father   . Heart failure Father   . Cancer Mother   . Diabetes Sister   . Diabetes Brother   . Diabetes Sister     There were no vitals taken for this visit.   Review of Systems She denies hypoglycemia and neck swelling.      Objective:   Physical Exam    Lab Results  Component Value Date   HGBA1C 6.8 (H) 03/11/2019   Lab Results  Component Value Date   TSH 0.97 03/11/2019   Lab Results  Component Value Date   CREATININE 0.85 03/11/2019   BUN 11 03/11/2019   NA 139 03/11/2019   K 4.4 03/11/2019   CL 103 03/11/2019   CO2 29 03/11/2019       Assessment & Plan:  Insulin-requiring type 2 DM, with PN: overcontrolled, given this regimen, which does match insulin to her changing needs throughout the day  Patient Instructions  Please reduce the insulin to 85 units each morning.  check your blood sugar twice a day.  vary the time of day when you check, between before the 3 meals, and at bedtime.  also check if you have symptoms of your blood sugar being too high or too low.  please keep a record of the readings and bring it to your next appointment here (or you can bring the meter itself).  You can write it on any piece of paper.  please call us sooner if your blood sugar goes below 70, or if you have a lot of readings over 200. Please come back for a follow-up appointment in 2 months, in person if possible.

## 2019-06-03 NOTE — Patient Instructions (Addendum)
Please reduce the insulin to 85 units each morning.  check your blood sugar twice a day.  vary the time of day when you check, between before the 3 meals, and at bedtime.  also check if you have symptoms of your blood sugar being too high or too low.  please keep a record of the readings and bring it to your next appointment here (or you can bring the meter itself).  You can write it on any piece of paper.  please call us sooner if your blood sugar goes below 70, or if you have a lot of readings over 200. Please come back for a follow-up appointment in 2 months, in person if possible.

## 2019-07-10 ENCOUNTER — Ambulatory Visit (INDEPENDENT_AMBULATORY_CARE_PROVIDER_SITE_OTHER): Payer: Medicare Other

## 2019-07-10 VITALS — BP 140/90 | Ht 66.0 in | Wt 198.0 lb

## 2019-07-10 DIAGNOSIS — Z0000 Encounter for general adult medical examination without abnormal findings: Secondary | ICD-10-CM

## 2019-07-10 NOTE — Patient Instructions (Addendum)
Terri Gray , Thank you for taking time to come for your Medicare Wellness Visit. I appreciate your ongoing commitment to your health goals. Please review the following plan we discussed and let me know if I can assist you in the future.   Screening recommendations/referrals: Colorectal Screening: colonoscopy in 2014 in [place]. Patient thinks she is past due and plans to do once pandemic settles down. Mammogram: past due. Patient states the last one was done prior to moving here in 2018 from [facility]. She plans to do this once pandemic settles down. Bone Density: patient plans to do this one pandemic settles down.  Vision and Dental Exams: Recommended annual ophthalmology exams for early detection of glaucoma and other disorders of the eye Recommended annual dental exams for proper oral hygiene  Diabetic Exams: Diabetic Eye Exam: Jan 2020. Patient will schedule an eye exam once the pandemic settles down. Diabetic Foot Exam: patient will schedule once pandemic settles down.   Vaccinations: Influenza vaccine: completed 02/18/2019. Due in Fall 2021. Pneumococcal vaccine: completed 03/01/2018. Up to date.  Tdap vaccine:  No record of this in your chart. Please call your insurance company to determine your out of pocket expense. You may also receive this vaccine at our office, your local pharmacy or Health Dept. Shingles vaccine: Please call your insurance company to determine your out of pocket expense for the Shingrix vaccine. You may receive this vaccine at your local pharmacy.  Advanced directives: Advance directives discussed with you today. Please bring a copy of your POA (Power of [place]) and/or Living Will to your next appointment.  Goals: Continue to drink at least 6-8 8oz glasses of water per day.  Recommend considering pool exercise when pandemic settles down.  Recommend to remove any items from the home that may cause slips or trips.  Recommend to decrease portion sizes by eating 3 small  healthy meals and at least 2 healthy snacks per day.  Next appointment: Please schedule your Annual Wellness Visit with your Nurse Health Advisor in one year.  Preventive Care 57 Years and Older, Female Preventive care refers to lifestyle choices and visits with your health care provider that can promote health and wellness. What does preventive care include?  A yearly physical exam. This is also called an annual well check.  Dental exams once or twice a year.  Routine eye exams. Ask your health care provider how often you should have your eyes checked.  Personal lifestyle choices, including:  Daily care of your teeth and gums.  Regular physical activity.  Eating a healthy diet.  Avoiding tobacco and drug use.  Limiting alcohol use.  Practicing safe sex.  Taking low-dose aspirin every day if recommended by your health care provider.  Taking vitamin and mineral supplements as recommended by your health care provider. What happens during an annual well check? The services and screenings done by your health care provider during your annual well check will depend on your age, overall health, lifestyle risk factors, and family history of disease. Counseling  Your health care provider may ask you questions about your:  Alcohol use.  Tobacco use.  Drug use.  Emotional well-being.  Home and relationship well-being.  Sexual activity.  Eating habits.  History of falls.  Memory and ability to understand (cognition).  Work and work Statistician.  Reproductive health. Screening  You may have the following tests or measurements:  Height, weight, and BMI.  Blood pressure.  Lipid and cholesterol levels. These may be checked every 5  years, or more frequently if you are over 73 years old.  Skin check.  Lung cancer screening. You may have this screening every year starting at age 63 if you have a 30-pack-year history of smoking and currently smoke or have quit within  the past 15 years.  Fecal occult blood test (FOBT) of the stool. You may have this test every year starting at age 84.  Flexible sigmoidoscopy or colonoscopy. You may have a sigmoidoscopy every 5 years or a colonoscopy every 10 years starting at age 24.  Hepatitis C blood test.  Hepatitis B blood test.  Sexually transmitted disease (STD) testing.  Diabetes screening. This is done by checking your blood sugar (glucose) after you have not eaten for a while (fasting). You may have this done every 1-3 years.  Bone density scan. This is done to screen for osteoporosis. You may have this done starting at age 49.  Mammogram. This may be done every 1-2 years. Talk to your health care provider about how often you should have regular mammograms. Talk with your health care provider about your test results, treatment options, and if necessary, the need for more tests. Vaccines  Your health care provider may recommend certain vaccines, such as:  Influenza vaccine. This is recommended every year.  Tetanus, diphtheria, and acellular pertussis (Tdap, Td) vaccine. You may need a Td booster every 10 years.  Zoster vaccine. You may need this after age 7.  Pneumococcal 13-valent conjugate (PCV13) vaccine. One dose is recommended after age 90.  Pneumococcal polysaccharide (PPSV23) vaccine. One dose is recommended after age 55. Talk to your health care provider about which screenings and vaccines you need and how often you need them. This information is not intended to replace advice given to you by your health care provider. Make sure you discuss any questions you have with your health care provider. Document Released: 06/04/2015 Document Revised: 01/26/2016 Document Reviewed: 03/09/2015 Elsevier Interactive Patient Education  2017 [employer] Prevention in the Home Falls can cause injuries. They can happen to people of all ages. There are many things you can do to make your home safe and to  help prevent falls. What can I do on the outside of my home?  Regularly fix the edges of walkways and driveways and fix any cracks.  Remove anything that might make you trip as you walk through a door, such as a raised step or threshold.  Trim any bushes or trees on the path to your home.  Use bright outdoor lighting.  Clear any walking paths of anything that might make someone trip, such as rocks or tools.  Regularly check to see if handrails are loose or broken. Make sure that both sides of any steps have handrails.  Any raised decks and porches should have guardrails on the edges.  Have any leaves, snow, or ice cleared regularly.  Use sand or salt on walking paths during winter.  Clean up any spills in your garage right away. This includes oil or grease spills. What can I do in the bathroom?  Use night lights.  Install grab bars by the toilet and in the tub and shower. Do not use towel bars as grab bars.  Use non-skid mats or decals in the tub or shower.  If you need to sit down in the shower, use a plastic, non-slip stool.  Keep the floor dry. Clean up any water that spills on the floor as soon as it happens.  Remove soap  buildup in the tub or shower regularly.  Attach bath mats securely with double-sided non-slip rug tape.  Do not have throw rugs and other things on the floor that can make you trip. What can I do in the bedroom?  Use night lights.  Make sure that you have a light by your bed that is easy to reach.  Do not use any sheets or blankets that are too big for your bed. They should not hang down onto the floor.  Have a firm chair that has side arms. You can use this for support while you get dressed.  Do not have throw rugs and other things on the floor that can make you trip. What can I do in the kitchen?  Clean up any spills right away.  Avoid walking on wet floors.  Keep items that you use a lot in easy-to-reach places.  If you need to reach  something above you, use a strong step stool that has a grab bar.  Keep electrical cords out of the way.  Do not use floor polish or wax that makes floors slippery. If you must use wax, use non-skid floor wax.  Do not have throw rugs and other things on the floor that can make you trip. What can I do with my stairs?  Do not leave any items on the stairs.  Make sure that there are handrails on both sides of the stairs and use them. Fix handrails that are broken or loose. Make sure that handrails are as long as the stairways.  Check any carpeting to make sure that it is firmly attached to the stairs. Fix any carpet that is loose or worn.  Avoid having throw rugs at the top or bottom of the stairs. If you do have throw rugs, attach them to the floor with carpet tape.  Make sure that you have a light switch at the top of the stairs and the bottom of the stairs. If you do not have them, ask someone to add them for you. What else can I do to help prevent falls?  Wear shoes that:  Do not have high heels.  Have rubber bottoms.  Are comfortable and fit you well.  Are closed at the toe. Do not wear sandals.  If you use a stepladder:  Make sure that it is fully opened. Do not climb a closed stepladder.  Make sure that both sides of the stepladder are locked into place.  Ask someone to hold it for you, if possible.  Clearly mark and make sure that you can see:  Any grab bars or handrails.  First and last steps.  Where the edge of each step is.  Use tools that help you move around (mobility aids) if they are needed. These include:  Canes.  Walkers.  Scooters.  Crutches.  Turn on the lights when you go into a dark area. Replace any light bulbs as soon as they burn out.  Set up your furniture so you have a clear path. Avoid moving your furniture around.  If any of your floors are uneven, fix them.  If there are any pets around you, be aware of where they are.  Review  your medicines with your doctor. Some medicines can make you feel dizzy. This can increase your chance of falling. Ask your doctor what other things that you can do to help prevent falls. This information is not intended to replace advice given to you by your health care provider.  Make sure you discuss any questions you have with your health care provider. Document Released: 03/04/2009 Document Revised: 10/14/2015 Document Reviewed: 06/12/2014 Elsevier Interactive Patient Education  2017 [employer].

## 2019-07-10 NOTE — Progress Notes (Signed)
This visit is being conducted via phone call due to the COVID-19 pandemic. This patient has given me verbal consent via phone to conduct this visit, patient states they are participating from their home address. Some vital signs may be absent or patient reported.   Patient identification: identified by name, DOB, and current address.  Location provider: [facility], Office Persons participating in the virtual visit: Franne Forts, LPN and Mrs. Delayna Roselle  Subjective:   Terri Gray is a 64 y.o. female who presents for an Initial Medicare Annual Wellness Visit.  Terri Gray is having problems with chronic pain in left ankle and left foot. She has been receiving cortisone injections but states that is not relieving the pain.This greatly impacts her ability to ambulate and she is not able to exercise. She is eating 2-3 healthy meals per day and drinking around 64 ounces of water every day. She has received her first covid vaccine and reports slighlty elevated BP since that.  Review of Systems    No ROS; Annual Medicare Wellness Visit  Cardiac Risk Factors include: sedentary lifestyle;obesity (BMI >30kg/m2);advanced age (>51mn, >>20women);hypertension     Objective:    Today's Vitals   07/10/19 0947  BP: 140/90  Weight: 198 lb (89.8 kg)  Height: 5' 6"  (1.676 m)   Body mass index is 31.96 kg/m.  Advanced Directives 07/10/2019  Does Patient Have a Medical Advance Directive? Yes  Type of [place]  Does patient want to make changes to medical advance directive? No - Patient declined  Copy of HRocain Chart? No - copy requested    Current Medications (verified) Outpatient Encounter Medications as of 07/10/2019  Medication Sig  . cyclobenzaprine (FLEXERIL) 10 MG tablet Take 1 tablet (10 mg total) by mouth as needed for muscle spasms.  .Marland Kitchenglucose blood (TRUE METRIX BLOOD GLUCOSE TEST) test strip 1  each by Other route 4 (four) times daily as needed for other. And lancets 2/day  . Insulin Glargine (LANTUS SOLOSTAR) 100 UNIT/ML Solostar Pen Inject 85 Units into the skin every morning. And pen needles 1/day  . levothyroxine (SYNTHROID) 75 MCG tablet Take 1 tablet (75 mcg total) by mouth daily before breakfast.  . metFORMIN (GLUCOPHAGE) 1000 MG tablet Take 1 tablet (1,000 mg total) by mouth 2 (two) times daily with a meal.  . metoprolol tartrate (LOPRESSOR) 25 MG tablet Take 1 tablet (25 mg total) by mouth 2 (two) times daily.  . pantoprazole (PROTONIX) 40 MG tablet Take 1 tablet (40 mg total) by mouth daily.  . simvastatin (ZOCOR) 40 MG tablet Take 1 tablet (40 mg total) by mouth daily.   No facility-administered encounter medications on file as of 07/10/2019.    Allergies (verified) Gabapentin   History: Past Medical History:  Diagnosis Date  . Allergy   . Arthritis   . Chicken pox   . Colon polyps   . Diabetes mellitus without complication ([place]   . GERD (gastroesophageal reflux disease)   . Hyperlipidemia   . Hypertension   . Migraines    Past Surgical History:  Procedure Laterality Date  . REPLACEMENT TOTAL KNEE Left 2016   Family History  Problem Relation Age of Onset  . Diabetes Father   . Kidney disease Father   . Heart failure Father   . Cancer Mother   . Diabetes Sister   . Diabetes Brother   . Diabetes Sister    Social History   Socioeconomic History  .  Marital status: Married    Spouse name: Not on file  . Number of children: 3  . Years of education: 64  . Highest education level: Associate degree: occupational, Hotel manager, or vocational program  Occupational History  . Not on file  Tobacco Use  . Smoking status: Never Smoker  . Smokeless tobacco: Never Used  Substance and Sexual Activity  . Alcohol use: No  . Drug use: No  . Sexual activity: Not on file  Other Topics Concern  . Not on file  Social History Narrative   HH 3    Married   Son  lives with them   1 dog   Enjoys reading, tv, computer   Social Determinants of Health   Financial Resource Strain: Low Risk   . Difficulty of Paying Living Expenses: Not very hard  Food Insecurity: No Food Insecurity  . Worried About Charity fundraiser in the Last Year: Never true  . Ran Out of Food in the Last Year: Never true  Transportation Needs: No Transportation Needs  . Lack of Transportation (Medical): No  . Lack of Transportation (Non-Medical): No  Physical Activity: Inactive  . Days of Exercise per Week: 0 days  . Minutes of Exercise per Session: 0 min  Stress: Stress Concern Present  . Feeling of Stress : To some extent  Social Connections: Somewhat Isolated  . Frequency of Communication with Friends and Family: More than three times a week  . Frequency of Social Gatherings with Friends and Family: Twice a week  . Attends Religious Services: Never  . Active Member of Clubs or Organizations: No  . Attends Archivist Meetings: Never  . Marital Status: Married    Tobacco Counseling Counseling given: Not Answered   Clinical Intake:  Pre-visit preparation completed: Yes  Pain : 0-10 Pain Type: Chronic pain Pain Location: Foot Pain Orientation: Left     BMI - recorded: 31.96 Nutritional Status: BMI > 30  Obese Nutritional Risks: None Diabetes: Yes CBG done?: No Did pt. bring in CBG monitor from home?: No  How often do you need to have someone help you when you read instructions, pamphlets, or other written materials from your doctor or pharmacy?: 1 - Never What is the last grade level you completed in school?: 2 years college  Interpreter Needed?: No  Information entered by :: Franne Forts, LPN.   Activities of Daily Living In your present state of health, do you have any difficulty performing the following activities: 07/10/2019  Hearing? Y  Vision? Y  Difficulty concentrating or making decisions? N  Walking or climbing stairs? N    Dressing or bathing? N  Doing errands, shopping? N  Preparing Food and eating ? N  Using the Toilet? N  In the past six months, have you accidently leaked urine? N  Do you have problems with loss of bowel control? N  Managing your Medications? N  Managing your Finances? N  Housekeeping or managing your Housekeeping? N  Some recent data might be hidden     Immunizations and Health Maintenance Immunization History  Administered Date(s) Administered  . Fluad Quad(high Dose 65+) 02/18/2019  . Influenza,inj,Quad PF,6+ Mos 02/20/2018  . Pneumococcal Conjugate-13 03/01/2018   Health Maintenance Due  Topic Date Due  . Hepatitis C Screening  08-10-1955  . URINE MICROALBUMIN  03/03/1965  . HIV Screening  03/03/1970  . TETANUS/TDAP  03/03/1974  . PAP SMEAR-Modifier  03/03/1976  . MAMMOGRAM  03/03/2005  . COLONOSCOPY  03/03/2005  . OPHTHALMOLOGY EXAM  11/29/2018    Patient Care Team: [name], MD as PCP - General (Family Medicine)  Indicate any recent Medical Services you may have received from other than [facility] providers in the past year (date may be approximate).     Assessment:   This is a routine wellness examination for Terri Gray.  Hearing/Vision screen  Hearing Screening   125Hz  250Hz  500Hz  1000Hz  2000Hz  3000Hz  4000Hz  6000Hz  8000Hz   Right ear:           Left ear:           Comments: Family tells her to have hearing checked. Plans to have hearing test after pandemic settles.   Vision Screening Comments: Wears glasses and reports problems with vision; last eye exam was Jan 2020.   Dietary issues and exercise activities discussed: Current Exercise Habits: The patient does not participate in regular exercise at present, Exercise limited by: orthopedic condition(s)  Goals   None    Depression Screen PHQ 2/9 Scores 07/10/2019 02/28/2017  PHQ - 2 Score 0 0    Fall Risk Fall Risk  07/10/2019  Number falls in past yr: 0  Injury with Fall? 0  Risk for fall due  to : Medication side effect  Risk for fall due to: Comment tripped over dog and stairs  Follow up Falls evaluation completed;Education provided;Falls prevention discussed    Is the patient's home free of loose throw rugs in walkways, pet beds, electrical cords, etc?   yes      Grab bars in the bathroom? yes      Handrails on the stairs?   yes      Adequate lighting?   yes  Timed Get Up and Go Performed N/A due to phone visit.  Cognitive Function:     6CIT Screen 07/10/2019  What Year? 0 points  What month? 0 points  What time? 0 points  Count back from 20 0 points  Months in reverse 0 points  Repeat phrase 0 points  Total Score 0    Screening Tests Health Maintenance  Topic Date Due  . Hepatitis C Screening  08/19/1955  . URINE MICROALBUMIN  03/03/1965  . HIV Screening  03/03/1970  . TETANUS/TDAP  03/03/1974  . PAP SMEAR-Modifier  03/03/1976  . MAMMOGRAM  03/03/2005  . COLONOSCOPY  03/03/2005  . OPHTHALMOLOGY EXAM  11/29/2018  . FOOT EXAM  07/17/2019  . HEMOGLOBIN A1C  09/09/2019  . INFLUENZA VACCINE  Completed    Qualifies for Shingles Vaccine? yes  Cancer Screenings: Lung: Low Dose CT Chest recommended if Age 18-80 years, 30 pack-year currently smoking OR have quit w/in 15years. Patient does not qualify. Breast: Up to date on Mammogram? No   Up to date of Bone Density/Dexa? No Colorectal: no  Additional Screenings:  Hepatitis C Screening: needs to be collected at next office appointment.    Plan:   Mrs. Felicetti understands the preventive health screenings that she is behind on. She plans on doing them after the pandemic settles down and she feels safe going to appointments. She is due for diabetic eye exam, diabetic foot exam, colonoscopy, mammogram, DEXA scan, updated Tdap, and shingrix vaccine. She also states she needs a pap smear done because her mother was diagnosed with ovarian cancer at age 24.   I have personally reviewed and noted the following in the  patient's chart:   . Medical and social history . Use of alcohol, tobacco or illicit drugs  .  Current medications and supplements . Functional ability and status . Nutritional status . Physical activity . Advanced directives . List of other physicians . Hospitalizations, surgeries, and ER visits in previous 12 months . Vitals . Screenings to include cognitive, depression, and falls . Referrals and appointments  In addition, I have reviewed and discussed with patient certain preventive protocols, quality metrics, and best practice recommendations. A written personalized care plan for preventive services as well as general preventive health recommendations were provided to patient.     Franne Forts, LPN   [id number]

## 2019-07-18 ENCOUNTER — Telehealth: Payer: Self-pay | Admitting: Endocrinology

## 2019-07-18 ENCOUNTER — Other Ambulatory Visit: Payer: Self-pay

## 2019-07-18 DIAGNOSIS — E1142 Type 2 diabetes mellitus with diabetic polyneuropathy: Secondary | ICD-10-CM

## 2019-07-18 MED ORDER — LANTUS SOLOSTAR 100 UNIT/ML ~~LOC~~ SOPN: 85.0000 [IU] | PEN_INJECTOR | SUBCUTANEOUS | 0 refills | Status: DC

## 2019-07-18 NOTE — Telephone Encounter (Signed)
Patient called re: Patient states that Dr. Loanne Drilling told patient he was going to prescribe 90 day supply (7 boxes) of Insulin Glargine (LANTUS SOLOSTAR) 100 UNIT/ML Solostar Pen but patient states the RX was sent for 30 day supply. Patient requests the following:  MEDICATION: Insulin Glargine (LANTUS SOLOSTAR) 100 UNIT/ML Solostar Pen  PHARMACY:   [id number] - [address] [place], [place] - [address] Phone:  [phone]  Fax:  [phone]      IS THIS A 90 DAY SUPPLY : Yes  IS PATIENT OUT OF MEDICATION: No  IF NOT; HOW MUCH IS LEFT: Approx. 1 week (  LAST APPOINTMENT DATE: @1 /04/2020  NEXT APPOINTMENT DATE:@04 /14/2021  DO WE HAVE YOUR PERMISSION TO LEAVE A DETAILED MESSAGE: Yes  OTHER COMMENTS:    **Let patient know to contact pharmacy at the end of the day to make sure medication is ready. **  ** Please notify patient to allow 48-72 hours to process**  **Encourage patient to contact the pharmacy for refills or they can request refills through [facility]**

## 2019-07-18 NOTE — Telephone Encounter (Signed)
Outpatient Medication Detail   Disp Refills Start End   Insulin Glargine (LANTUS SOLOSTAR) 100 UNIT/ML Solostar Pen 76.5 mL 0 07/18/2019    Sig - Route: Inject 85 Units into the skin every morning. - Subcutaneous   Sent to pharmacy as: Insulin Glargine (LANTUS SOLOSTAR) 100 UNIT/ML Solostar Pen   E-Prescribing Status: Receipt confirmed by pharmacy (07/18/2019  8:25 AM EST)    Rx was written for 90 day supply in pen format. Therefore, Rx has been re-written to mL format in hopes of reducing pharmacy confusion. Either way the Rx WAS written for a 90 day supply which seems to be a pharmacy issue rather than how the Rx was written.

## 2019-09-01 ENCOUNTER — Telehealth: Payer: Self-pay | Admitting: Family Medicine

## 2019-09-01 NOTE — Telephone Encounter (Signed)
Ok

## 2019-09-01 NOTE — Telephone Encounter (Signed)
Ok for referral?

## 2019-09-01 NOTE — Telephone Encounter (Signed)
Pt call and stated she need a referral to see a endocrinology at [facility] .she was seeing dr Loanne Drilling but don't want to see him  Any more.

## 2019-09-02 ENCOUNTER — Other Ambulatory Visit: Payer: Self-pay

## 2019-09-02 DIAGNOSIS — Z794 Long term (current) use of insulin: Secondary | ICD-10-CM

## 2019-09-02 DIAGNOSIS — E119 Type 2 diabetes mellitus without complications: Secondary | ICD-10-CM

## 2019-09-02 NOTE — Telephone Encounter (Signed)
Referral to Endocrinology has been placed per pt requests

## 2019-09-03 ENCOUNTER — Ambulatory Visit: Payer: Medicare Other | Admitting: Endocrinology

## 2019-09-09 ENCOUNTER — Telehealth: Payer: Self-pay | Admitting: Family Medicine

## 2019-09-09 ENCOUNTER — Other Ambulatory Visit: Payer: Self-pay

## 2019-09-09 DIAGNOSIS — E119 Type 2 diabetes mellitus without complications: Secondary | ICD-10-CM

## 2019-09-09 DIAGNOSIS — Z794 Long term (current) use of insulin: Secondary | ICD-10-CM

## 2019-09-09 NOTE — Telephone Encounter (Signed)
Pt referral to Endocrinology in [facility] was placed per pt request, pt is aware to wait for a call for scheduling

## 2019-09-09 NOTE — Telephone Encounter (Signed)
Pt would like her referral for endocrinology to be sent to [facility].  Address: [facility], [address], [place], [place] [address] [phone] [phone]

## 2019-10-06 ENCOUNTER — Telehealth: Payer: Self-pay | Admitting: Family Medicine

## 2019-10-06 NOTE — Telephone Encounter (Signed)
Pt stated she would like to get the shingles vaccine and wonders if she is due?   Pt can be reached at [phone]

## 2019-10-06 NOTE — Telephone Encounter (Signed)
Left a detailed voicemail for pt to call the office and schedule appointment to get the shingles vaccine.

## 2019-10-07 ENCOUNTER — Other Ambulatory Visit: Payer: Self-pay

## 2019-10-07 NOTE — Telephone Encounter (Signed)
Pt scheduled appointment for tomorrow for her shingles vaccine

## 2019-10-08 ENCOUNTER — Ambulatory Visit: Payer: Medicare Other

## 2019-10-08 ENCOUNTER — Ambulatory Visit (INDEPENDENT_AMBULATORY_CARE_PROVIDER_SITE_OTHER): Payer: Medicare Other | Admitting: Family Medicine

## 2019-10-08 ENCOUNTER — Encounter: Payer: Self-pay | Admitting: Family Medicine

## 2019-10-08 VITALS — BP 130/80 | HR 76 | Temp 97.8°F | Resp 16 | Ht 66.0 in | Wt 221.2 lb

## 2019-10-08 DIAGNOSIS — J309 Allergic rhinitis, unspecified: Secondary | ICD-10-CM

## 2019-10-08 DIAGNOSIS — L309 Dermatitis, unspecified: Secondary | ICD-10-CM

## 2019-10-08 DIAGNOSIS — J4542 Moderate persistent asthma with status asthmaticus: Secondary | ICD-10-CM

## 2019-10-08 MED ORDER — TRIAMCINOLONE ACETONIDE 0.1 % EX CREA: 1.0000 "application " | TOPICAL_CREAM | Freq: Two times a day (BID) | CUTANEOUS | 0 refills | Status: DC | PRN

## 2019-10-08 MED ORDER — PREDNISONE 20 MG PO TABS: ORAL_TABLET | ORAL | 0 refills | Status: DC

## 2019-10-08 MED ORDER — METHYLPREDNISOLONE ACETATE 40 MG/ML IJ SUSP
40.0000 mg | Freq: Once | INTRAMUSCULAR | Status: AC
  Administered 2019-10-08: 40 mg via INTRAMUSCULAR

## 2019-10-08 MED ORDER — ALBUTEROL SULFATE HFA 108 (90 BASE) MCG/ACT IN AERS: 2.0000 | INHALATION_SPRAY | Freq: Four times a day (QID) | RESPIRATORY_TRACT | 1 refills | Status: DC | PRN

## 2019-10-08 MED ORDER — BUDESONIDE-FORMOTEROL FUMARATE 160-4.5 MCG/ACT IN AERO: 2.0000 | INHALATION_SPRAY | Freq: Two times a day (BID) | RESPIRATORY_TRACT | 3 refills | Status: DC

## 2019-10-08 NOTE — Patient Instructions (Addendum)
A few things to remember from today's visit:  Start a headache diary. Take prednisone with breakfast, start tomorrow. Monitor blood sugar closely.  Zyrtec 10 mg daily in the morning. Topical steroid, small amount twice daily as needed.   Over-the-counter Eucerin or Cetaphil as needed. Nasal irrigations with saline water as needed.  If you need refills please call your pharmacy. Do not use My Chart to request refills or for acute issues that need immediate attention.    Please be sure medication list is accurate. If a new problem present, please set up appointment sooner than planned today.

## 2019-10-08 NOTE — Progress Notes (Signed)
Pt came in today with

## 2019-10-08 NOTE — Progress Notes (Signed)
ACUTE VISIT  Chief Complaint  Patient presents with  . Rash   HPI: [name] is a 64 y.o. female, who is here today with above concern. She noted left lower extremity pruritic rash about 2 months ago, some healing and she is seeing new lesions.  She thinks this is shingles because it looks like rash she had twice before and diagnosed with zoster infection. Erythema and tenderness after scratching affected areas.  Similar rash 2 years ago on RLE, still has some pruritic lesions. Eczema during childhood. She is using Nivea as needed.  Negative for new medication, detergent, soap outdoor exposure, or insect bite. Problem seems to be stable.  She is also complaining about intermittent episodes of cough and wheezing, she is reporting history of asthma. She is currently on Symbicort 80/4.5 mcg twice daily. She has symptoms a few times per week, even at rest.  She is not sure about exacerbating or alleviating factors. She has not had fever, sore throat, CP, palpitations, or dyspnea.  Also reporting nasal congestion, rhinorrhea, postnasal drainage.  These symptoms noted after COVID-19 vaccination. Negative for sick contact. 2 to 3 days of pruritus in right eye. Negative for visual changes, purulent drainage, or conjunctival erythema. It is getting better. She has not used OTC medication.   Review of Systems  Constitutional: Positive for chills (Occasionally for a while.) and fatigue. Negative for appetite change and fever.  HENT: Negative for congestion, ear pain, mouth sores, sneezing and sore throat.   Eyes: Positive for itching. Negative for redness.  Respiratory: Negative for cough, shortness of breath and wheezing.   Cardiovascular: Negative for chest pain, palpitations and leg swelling.  Gastrointestinal: Negative for abdominal pain, diarrhea, nausea and vomiting.  Musculoskeletal: Negative for gait problem and myalgias.  Skin: Positive for rash.    Allergic/Immunologic: Positive for environmental allergies.  Neurological: Negative for weakness, numbness and headaches.   Rest see pertinent positives and negatives per HPI.   Current Outpatient Medications on File Prior to Visit  Medication Sig Dispense Refill  . chlorhexidine (PERIDEX) 0.12 % solution 15 mLs 2 (two) times daily.    . cyclobenzaprine (FLEXERIL) 10 MG tablet Take 1 tablet (10 mg total) by mouth as needed for muscle spasms. 90 tablet 5  . glucose blood (TRUE METRIX BLOOD GLUCOSE TEST) test strip 1 each by Other route 4 (four) times daily as needed for other. And lancets 2/day 360 each 3  . HYDROcodone-acetaminophen (NORCO/VICODIN) 5-325 MG tablet Take 1 tablet by mouth every 6 (six) hours as needed. for pain    . ibuprofen (ADVIL) 800 MG tablet Take 800 mg by mouth every 8 (eight) hours as needed.    . Insulin Glargine (LANTUS SOLOSTAR) 100 UNIT/ML Solostar Pen Inject 85 Units into the skin every morning. 76.5 mL 0  . levothyroxine (SYNTHROID) 75 MCG tablet Take 1 tablet (75 mcg total) by mouth daily before breakfast. 90 tablet 3  . metFORMIN (GLUCOPHAGE) 1000 MG tablet Take 1 tablet (1,000 mg total) by mouth 2 (two) times daily with a meal. 180 tablet 3  . metoprolol tartrate (LOPRESSOR) 25 MG tablet Take 1 tablet (25 mg total) by mouth 2 (two) times daily. 180 tablet 3  . pantoprazole (PROTONIX) 40 MG tablet Take 1 tablet (40 mg total) by mouth daily. 90 tablet 3  . simvastatin (ZOCOR) 40 MG tablet Take 1 tablet (40 mg total) by mouth daily. 90 tablet 3   No current facility-administered medications on file prior  to visit.     Past Medical History:  Diagnosis Date  . Allergy   . Arthritis   . Chicken pox   . Colon polyps   . Diabetes mellitus without complication ([place])   . GERD (gastroesophageal reflux disease)   . Hyperlipidemia   . Hypertension   . Migraines    Allergies  Allergen Reactions  . Gabapentin     Stomach ache    Social History    Socioeconomic History  . Marital status: Married    Spouse name: Not on file  . Number of children: 3  . Years of education: 66  . Highest education level: Associate degree: occupational, Hotel manager, or vocational program  Occupational History  . Not on file  Tobacco Use  . Smoking status: Never Smoker  . Smokeless tobacco: Never Used  Substance and Sexual Activity  . Alcohol use: No  . Drug use: No  . Sexual activity: Not on file  Other Topics Concern  . Not on file  Social History Narrative   HH 3    Married   Son lives with them   1 dog   Enjoys reading, tv, computer   Social Determinants of Health   Financial Resource Strain: Low Risk   . Difficulty of Paying Living Expenses: Not very hard  Food Insecurity: No Food Insecurity  . Worried About Charity fundraiser in the Last Year: Never true  . Ran Out of Food in the Last Year: Never true  Transportation Needs: No Transportation Needs  . Lack of Transportation (Medical): No  . Lack of Transportation (Non-Medical): No  Physical Activity: Inactive  . Days of Exercise per Week: 0 days  . Minutes of Exercise per Session: 0 min  Stress: Stress Concern Present  . Feeling of Stress : To some extent  Social Connections: Somewhat Isolated  . Frequency of Communication with Friends and Family: More than three times a week  . Frequency of Social Gatherings with Friends and Family: Twice a week  . Attends Religious Services: Never  . Active Member of Clubs or Organizations: No  . Attends Archivist Meetings: Never  . Marital Status: Married    Vitals:   10/08/19 1133  BP: 130/80  Pulse: 76  Resp: 16  Temp: 97.8 F (36.6 C)  SpO2: 99%   Body mass index is 35.71 kg/m.  Physical Exam  Nursing note and vitals reviewed. Constitutional: She is oriented to person, place, and time. She appears well-developed. No distress.  HENT:  Head: Normocephalic and atraumatic.  Mouth/Throat: Oropharynx is clear and  moist and mucous membranes are normal.  Mild hypertrophic turbinates. Mildly dry nasal mucosa.  Eyes: Pupils are equal, round, and reactive to light. Conjunctivae and EOM are normal.  Minimal right upper eye lid edge erythema, not tender.  Respiratory: Effort normal and breath sounds normal. No respiratory distress.  Musculoskeletal:        General: No edema.  Lymphadenopathy:    She has no cervical adenopathy.  Neurological: She is alert and oriented to person, place, and time. She has normal strength. Gait normal.  Skin: Skin is warm. Rash noted. No ecchymosis noted. Rash is not vesicular.     Micropapular erythematous rash scatted on LLE and buttock. A few on RLE. Dry skin. Postinflammatory pigmentation changes on areas previously affected by rash. She has scratching signs and superficial excoriations. Rash in nor tender. No dermatomal distribution.  Psychiatric: Her speech is normal. Her mood appears anxious.  Well groomed, good eye contact.   ASSESSMENT AND PLAN:  Ms.Corrine was seen today for rash.  Diagnoses and all orders for this visit:  Moderate persistent asthma with status asthmaticus in adult Auscultation today is negative. I do not think imaging is needed at this time. Albuterol inh 2 puff every 6 hours for a week then as needed for wheezing or shortness of breath.  Symbicort was increased from 80-4.5 mcg to 160-4.5 mcg twice daily. She has had a spacer at home. Instructed about warning signs. Follow-up with PCP in 2 weeks, before if needed.  -     budesonide-formoterol (SYMBICORT) 160-4.5 MCG/ACT inhaler; Inhale 2 puffs into the lungs 2 (two) times daily. -     albuterol (VENTOLIN HFA) 108 (90 Base) MCG/ACT inhaler; Inhale 2 puffs into the lungs every 6 (six) hours as needed for wheezing or shortness of breath.  Eczema, unspecified type Explained that rash she has today is not suggestive of shingles. Because extension of rash, recommend oral  prednisone. Here in the office after verbal consent she received Depo-Medrol 40 mg IM, tomorrow she will start prednisone taper, recommend taking it with breakfast. Zyrtec 10 mg daily. Lukewarm water for showers and daily moisturizer.  -     predniSONE (DELTASONE) 20 MG tablet; 2 tabs for 3 days, 1 tabs for 3 days, 1/2 tabs for 3 days. Take tables together with breakfast. -     triamcinolone cream (KENALOG) 0.1 %; Apply 1 application topically 2 (two) times daily as needed. -     methylPREDNISolone acetate (DEPO-MEDROL) injection 40 mg  Allergic rhinitis, unspecified seasonality, unspecified trigger Zyrtec 10 mg daily. Nasal saline irrigations as needed. Intranasal steroid may increase the risk of bleeding, so we will hold on this for now.  Return in about 2 weeks (around 10/22/2019) for rash and HA with PCP.   Meriem Lemieux G. [place], MD  [facility]. [place] office.  Discharge Instructions   None    A few things to remember from today's visit:  Start a headache diary. Take prednisone with breakfast, start tomorrow. Monitor blood sugar closely.  Zyrtec 10 mg daily in the morning. Topical steroid, small amount twice daily as needed.   Over-the-counter Eucerin or Cetaphil as needed. Nasal irrigations with saline water as needed.  If you need refills please call your pharmacy. Do not use My Chart to request refills or for acute issues that need immediate attention.    Please be sure medication list is accurate. If a new problem present, please set up appointment sooner than planned today.

## 2019-10-27 ENCOUNTER — Other Ambulatory Visit: Payer: Self-pay

## 2019-10-27 ENCOUNTER — Other Ambulatory Visit: Payer: Self-pay | Admitting: Endocrinology

## 2019-10-27 DIAGNOSIS — E1142 Type 2 diabetes mellitus with diabetic polyneuropathy: Secondary | ICD-10-CM

## 2019-10-28 ENCOUNTER — Ambulatory Visit (INDEPENDENT_AMBULATORY_CARE_PROVIDER_SITE_OTHER): Payer: Medicare Other | Admitting: *Deleted

## 2019-10-28 DIAGNOSIS — Z23 Encounter for immunization: Secondary | ICD-10-CM | POA: Diagnosis not present

## 2019-10-28 NOTE — Progress Notes (Signed)
Per orders of Dr. Volanda Napoleon, injection of 1st Shingles Vaccine given by [facility]ac[facility] Pontes. Patient tolerated injection well.

## 2019-11-20 ENCOUNTER — Encounter: Payer: Medicare Other | Admitting: Family Medicine

## 2019-12-03 ENCOUNTER — Encounter: Payer: Self-pay | Admitting: Family Medicine

## 2019-12-03 ENCOUNTER — Other Ambulatory Visit: Payer: Self-pay

## 2019-12-03 ENCOUNTER — Ambulatory Visit (INDEPENDENT_AMBULATORY_CARE_PROVIDER_SITE_OTHER): Payer: Medicare Other | Admitting: Family Medicine

## 2019-12-03 VITALS — BP 120/80 | HR 74 | Temp 98.3°F | Ht 65.75 in | Wt 220.0 lb

## 2019-12-03 DIAGNOSIS — Z0001 Encounter for general adult medical examination with abnormal findings: Secondary | ICD-10-CM

## 2019-12-03 DIAGNOSIS — Z0000 Encounter for general adult medical examination without abnormal findings: Secondary | ICD-10-CM

## 2019-12-03 DIAGNOSIS — Z1211 Encounter for screening for malignant neoplasm of colon: Secondary | ICD-10-CM

## 2019-12-03 DIAGNOSIS — G8929 Other chronic pain: Secondary | ICD-10-CM

## 2019-12-03 DIAGNOSIS — Z1159 Encounter for screening for other viral diseases: Secondary | ICD-10-CM | POA: Diagnosis not present

## 2019-12-03 DIAGNOSIS — R002 Palpitations: Secondary | ICD-10-CM

## 2019-12-03 DIAGNOSIS — R0601 Orthopnea: Secondary | ICD-10-CM

## 2019-12-03 DIAGNOSIS — E1142 Type 2 diabetes mellitus with diabetic polyneuropathy: Secondary | ICD-10-CM | POA: Diagnosis not present

## 2019-12-03 DIAGNOSIS — R0989 Other specified symptoms and signs involving the circulatory and respiratory systems: Secondary | ICD-10-CM

## 2019-12-03 DIAGNOSIS — Z794 Long term (current) use of insulin: Secondary | ICD-10-CM | POA: Diagnosis not present

## 2019-12-03 DIAGNOSIS — M2550 Pain in unspecified joint: Secondary | ICD-10-CM

## 2019-12-03 DIAGNOSIS — R000 Tachycardia, unspecified: Secondary | ICD-10-CM | POA: Diagnosis not present

## 2019-12-03 DIAGNOSIS — L309 Dermatitis, unspecified: Secondary | ICD-10-CM | POA: Diagnosis not present

## 2019-12-03 DIAGNOSIS — R09A2 Foreign body sensation, throat: Secondary | ICD-10-CM

## 2019-12-03 DIAGNOSIS — J302 Other seasonal allergic rhinitis: Secondary | ICD-10-CM

## 2019-12-03 DIAGNOSIS — E039 Hypothyroidism, unspecified: Secondary | ICD-10-CM

## 2019-12-03 LAB — POCT URINALYSIS DIPSTICK
Bilirubin, UA: NEGATIVE
Blood, UA: NEGATIVE
Glucose, UA: NEGATIVE
Ketones, UA: NEGATIVE
Leukocytes, UA: NEGATIVE
Nitrite, UA: NEGATIVE
Protein, UA: NEGATIVE
Spec Grav, UA: 1.015 (ref 1.010–1.025)
Urobilinogen, UA: 0.2 E.U./dL
pH, UA: 6.5 (ref 5.0–8.0)

## 2019-12-03 MED ORDER — HYDROCORTISONE VALERATE 0.2 % EX OINT: 1.0000 "application " | TOPICAL_OINTMENT | Freq: Two times a day (BID) | CUTANEOUS | 1 refills | Status: AC

## 2019-12-03 MED ORDER — FEXOFENADINE HCL 180 MG PO TABS: 180.0000 mg | ORAL_TABLET | Freq: Every day | ORAL | 1 refills | Status: DC

## 2019-12-03 NOTE — Patient Instructions (Addendum)
Health Maintenance Due  Topic Date Due   Hepatitis C Screening  Never done   URINE MICROALBUMIN  Never done   COVID-19 Vaccine (1) Never done   HIV Screening  Never done   TETANUS/TDAP  Never done   PAP SMEAR-Modifier  Never done   MAMMOGRAM  Never done   COLONOSCOPY  Never done   OPHTHALMOLOGY EXAM  11/29/2018   FOOT EXAM  07/17/2019   HEMOGLOBIN A1C  09/09/2019    Depression screen PHQ 2/9 07/10/2019 02/28/2017  Decreased Interest 0 0  Down, Depressed, Hopeless 0 0  PHQ - 2 Score 0 0   Health Maintenance After Age 45 After age 9, you are at a higher risk for certain long-term diseases and infections as well as injuries from falls. Falls are a major cause of broken bones and head injuries in people who are older than age 43. Getting regular preventive care can help to keep you healthy and well. Preventive care includes getting regular testing and making lifestyle changes as recommended by your health care provider. Talk with your health care provider about:  Which screenings and tests you should have. A screening is a test that checks for a disease when you have no symptoms.  A diet and exercise plan that is right for you. What should I know about screenings and tests to prevent falls? Screening and testing are the best ways to find a health problem early. Early diagnosis and treatment give you the best chance of managing medical conditions that are common after age 44. Certain conditions and lifestyle choices may make you more likely to have a fall. Your health care provider may recommend:  Regular vision checks. Poor vision and conditions such as cataracts can make you more likely to have a fall. If you wear glasses, make sure to get your prescription updated if your vision changes.  Medicine review. Work with your health care provider to regularly review all of the medicines you are taking, including over-the-counter medicines. Ask your health care provider about any  side effects that may make you more likely to have a fall. Tell your health care provider if any medicines that you take make you feel dizzy or sleepy.  Osteoporosis screening. Osteoporosis is a condition that causes the bones to get weaker. This can make the bones weak and cause them to break more easily.  Blood pressure screening. Blood pressure changes and medicines to control blood pressure can make you feel dizzy.  Strength and balance checks. Your health care provider may recommend certain tests to check your strength and balance while standing, walking, or changing positions.  Foot health exam. Foot pain and numbness, as well as not wearing proper footwear, can make you more likely to have a fall.  Depression screening. You may be more likely to have a fall if you have a fear of falling, feel emotionally low, or feel unable to do activities that you used to do.  Alcohol use screening. Using too much alcohol can affect your balance and may make you more likely to have a fall. What actions can I take to lower my risk of falls? General instructions  Talk with your health care provider about your risks for falling. Tell your health care provider if: ? You fall. Be sure to tell your health care provider about all falls, even ones that seem minor. ? You feel dizzy, sleepy, or off-balance.  Take over-the-counter and prescription medicines only as told by your health care provider.  These include any supplements.  Eat a healthy diet and maintain a healthy weight. A healthy diet includes low-fat dairy products, low-fat (lean) meats, and fiber from whole grains, beans, and lots of fruits and vegetables. Home safety  Remove any tripping hazards, such as rugs, cords, and clutter.  Install safety equipment such as grab bars in bathrooms and safety rails on stairs.  Keep rooms and walkways well-lit. Activity   Follow a regular exercise program to stay fit. This will help you maintain your  balance. Ask your health care provider what types of exercise are appropriate for you.  If you need a cane or walker, use it as recommended by your health care provider.  Wear supportive shoes that have nonskid soles. Lifestyle  Do not drink alcohol if your health care provider tells you not to drink.  If you drink alcohol, limit how much you have: ? 0-1 drink a day for women. ? 0-2 drinks a day for men.  Be aware of how much alcohol is in your drink. In the [place] one drink equals one typical bottle of beer (12 oz), one-half glass of wine (5 oz), or one shot of hard liquor (1 oz).  Do not use any products that contain nicotine or tobacco, such as cigarettes and e-cigarettes. If you need help quitting, ask your health care provider. Summary  Having a healthy lifestyle and getting preventive care can help to protect your health and wellness after age 19.  Screening and testing are the best way to find a health problem early and help you avoid having a fall. Early diagnosis and treatment give you the best chance for managing medical conditions that are more common for people who are older than age 17.  Falls are a major cause of broken bones and head injuries in people who are older than age 33. Take precautions to prevent a fall at home.  Work with your health care provider to learn what changes you can make to improve your health and wellness and to prevent falls. This information is not intended to replace advice given to you by your health care provider. Make sure you discuss any questions you have with your health care provider. Document Revised: 08/29/2018 Document Reviewed: 03/21/2017 Elsevier Patient Education  [place].  Eczema Eczema is a broad term for a group of skin conditions that cause skin to become rough and inflamed. Each type of eczema has different triggers, symptoms, and treatments. Eczema of any type is usually itchy and symptoms range from mild to  severe. Eczema and its symptoms are not spread from person to person (are not contagious). It can appear on different parts of the body at different times. Your eczema may not look the same as someone else's eczema. What are the types of eczema? Atopic dermatitis This is a long-term (chronic) skin disease that keeps coming back (recurring). Usual symptoms are dry skin and small, solid pimples that may swell and leak fluid (weep). Contact dermatitis  This happens when something irritates the skin and causes a rash. The irritation can come from substances that you are allergic to (allergens), such as poison ivy, chemicals, or medicines that were applied to your skin. Dyshidrotic eczema This is a form of eczema on the hands and feet. It shows up as very itchy, fluid-filled blisters. It can affect people of any age, but is more common before age 32. Hand eczema  This causes very itchy areas of skin on the palms and sides  of the hands and fingers. This type of eczema is common in industrial jobs where you may be exposed to many different types of irritants. Lichen simplex chronicus This type of eczema occurs when a person constantly scratches one area of the body. Repeated scratching of the area leads to thickened skin (lichenification). Lichen simplex chronicus can occur along with other types of eczema. It is more common in adults, but may be seen in children as well. Nummular eczema This is a common type of eczema. It has no known cause. It typically causes a red, circular, crusty lesion (plaque) that may be itchy. Scratching may become a habit and can cause bleeding. Nummular eczema occurs most often in people of middle-age or older. It most often affects the hands. Seborrheic dermatitis This is a common skin disease that mainly affects the scalp. It may also affect any oily areas of the body, such as the face, sides of nose, eyebrows, ears, eyelids, and chest. It is marked by small scaling and  redness of the skin (erythema). This can affect people of all ages. In infants, this condition is known as [employer] certified accountant." Stasis dermatitis This is a common skin disease that usually appears on the legs and feet. It most often occurs in people who have a condition that prevents blood from being pumped through the veins in the legs (chronic venous insufficiency). Stasis dermatitis is a chronic condition that needs long-term management. How is eczema diagnosed? Your health care provider will examine your skin and review your medical history. He or she may also give you skin patch tests. These tests involve taking patches that contain possible allergens and placing them on your back. He or she will then check in a few days to see if an allergic reaction occurred. What are the common treatments? Treatment for eczema is based on the type of eczema you have. Hydrocortisone steroid medicine can relieve itching quickly and help reduce inflammation. This medicine may be prescribed or obtained over-the-counter, depending on the strength of the medicine that is needed. Follow these instructions at home:  Take over-the-counter and prescription medicines only as told by your health care provider.  Use creams or ointments to moisturize your skin. Do not use lotions.  Learn what triggers or irritates your symptoms. Avoid these things.  Treat symptom flare-ups quickly.  Do not itch your skin. This can make your rash worse.  Keep all follow-up visits as told by your health care provider. This is important. Where to find more information  The American Academy of Dermatology: http://jones-macias.info/  The National Eczema Association: www.nationaleczema.org Contact a health care provider if:  You have serious itching, even with treatment.  You regularly scratch your skin until it bleeds.  Your rash looks different than usual.  Your skin is painful, swollen, or more red than usual.  You have a  fever. Summary  There are eight general types of eczema. Each type has different triggers.  Eczema of any type causes itching that may range from mild to severe.  Treatment varies based on the type of eczema you have. Hydrocortisone steroid medicine can help with itching and inflammation.  Protecting your skin is the best way to prevent eczema. Use moisturizers and lotions. Avoid triggers and irritants, and treat flare-ups quickly. This information is not intended to replace advice given to you by your health care provider. Make sure you discuss any questions you have with your health care provider. Document Revised: 04/20/2017 Document Reviewed: 09/21/2016 Elsevier Patient Education  [place] [facility] Joint pain (arthralgia) may be caused by many things. Joint pain is likely to go away when you follow instructions from your health care provider for relieving pain at home. However, joint pain can also be caused by conditions that require more treatment. Common causes of joint pain include:  Bruising in the area of the joint.  Injury caused by repeating certain movements too many times (overuse injury).  Age-related joint wear and tear (osteoarthritis).  Buildup of uric acid crystals in the joint (gout).  Inflammation of the joint (rheumatic disease).  Various other forms of arthritis.  Infections of the joint (septic arthritis) or of the bone (osteomyelitis). Your health care provider may recommend that you take pain medicine or wear a supportive device like an elastic bandage, sling, or splint. If your joint pain continues, you may need lab or imaging tests to diagnose the cause of your joint pain. Follow these instructions at home: Managing pain, stiffness, and swelling   If directed, put ice on the painful area. Icing can help to relieve joint pain and swelling. ? Put ice in a plastic bag. ? Place a towel between your skin and the bag. ? Leave the ice on for 20  minutes, 2-3 times a day.  If directed, apply heat to the painful area as often as told by your health care provider. Heat can reduce the stiffness of your muscles and joints. Use the heat source that your health care provider recommends, such as a moist heat pack or a heating pad. ? Place a towel between your skin and the heat source. ? Leave the heat on for 20-30 minutes. ? Remove the heat if your skin turns bright red. This is especially important if you are unable to feel pain, heat, or cold. You may have a greater risk of getting burned.  Move your fingers or toes below the painful joint often. You can avoid stiffness and lessen swelling by doing this.  If possible, raise (elevate) the painful joint above the level of your heart while you are sitting or lying down. To do this, try putting a few pillows under the painful joint. Activity  Rest the painful joint for as long as directed. Do not do anything that causes or worsens pain.  Begin exercising or stretching the affected area, as told by your health care provider. Ask your health care provider what types of exercise are safe for you. If you have an elastic bandage, sling, or splint:  Wear the supportive device as told by your health care provider. Remove it only as told by your health care provider.  Loosen the device if your fingers or toes below the joint tingle, become numb, or turn cold and blue.  Keep the device clean.  Ask your health care provider if you should remove the device before bathing. You may need to cover it with a watertight covering when you take a bath or a shower. General instructions  Take over-the-counter and prescription medicines only as told by your health care provider.  Do not use any products that contain nicotine or tobacco, such as cigarettes and e-cigarettes. If you need help quitting, ask your health care provider.  Keep all follow-up visits as told by your health care provider. This is  important. Contact a health care provider if:  You have pain that gets worse and does not get better with medicine.  Your joint pain does not improve within 3 days.  You have  increased bruising or swelling.  You have a fever.  You lose 10 lb (4.5 kg) or more without trying. Get help right away if:  You cannot move the joint.  Your fingers or toes tingle, become numb, or turn cold and blue.  You have a fever along with a joint that is red, warm, and swollen. Summary  Joint pain (arthralgia) may be caused by many things.  Your health care provider may recommend that you take pain medicine or wear a supportive device like an elastic bandage, sling, or splint.  If your joint pain continues, you may need tests to diagnose the cause of your joint pain.  Take over-the-counter and prescription medicines only as told by your health care provider. This information is not intended to replace advice given to you by your health care provider. Make sure you discuss any questions you have with your health care provider. Document Revised: 04/20/2017 Document Reviewed: 02/21/2017 Elsevier Patient Education  [place].  Palpitations Palpitations are feelings that your heartbeat is irregular or is faster than normal. It may feel like your heart is fluttering or skipping a beat. Palpitations are usually not a serious problem. They may be caused by many things, including smoking, caffeine, alcohol, stress, and certain medicines or drugs. Most causes of palpitations are not serious. However, some palpitations can be a sign of a serious problem. You may need further tests to rule out serious medical problems. Follow these instructions at home:     Pay attention to any changes in your condition. Take these actions to help manage your symptoms: Eating and drinking  Avoid foods and drinks that may cause palpitations. These may include: ? Caffeinated coffee, tea, soft drinks, diet pills, and  energy drinks. ? Chocolate. ? Alcohol. Lifestyle  Take steps to reduce your stress and anxiety. Things that can help you relax include: ? Yoga. ? Mind-body activities, such as deep breathing, meditation, or using words and images to create positive thoughts (guided imagery). ? Physical activity, such as swimming, jogging, or walking. Tell your health care provider if your palpitations increase with activity. If you have chest pain or shortness of breath with activity, do not continue the activity until you are seen by your health care provider. ? Biofeedback. This is a method that helps you learn to use your mind to control things in your body, such as your heartbeat.  Do not use drugs, including cocaine or ecstasy. Do not use marijuana.  Get plenty of rest and sleep. Keep a regular bed time. General instructions  Take over-the-counter and prescription medicines only as told by your health care provider.  Do not use any products that contain nicotine or tobacco, such as cigarettes and e-cigarettes. If you need help quitting, ask your health care provider.  Keep all follow-up visits as told by your health care provider. This is important. These may include visits for further testing if palpitations do not go away or get worse. Contact a health care provider if you:  Continue to have a fast or irregular heartbeat after 24 hours.  Notice that your palpitations occur more often. Get help right away if you:  Have chest pain or shortness of breath.  Have a severe headache.  Feel dizzy or you faint. Summary  Palpitations are feelings that your heartbeat is irregular or is faster than normal. It may feel like your heart is fluttering or skipping a beat.  Palpitations may be caused by many things, including smoking, caffeine, alcohol,  stress, certain medicines, and drugs.  Although most causes of palpitations are not serious, some causes can be a sign of a serious medical  problem.  Get help right away if you faint or have chest pain, shortness of breath, a severe headache, or dizziness. This information is not intended to replace advice given to you by your health care provider. Make sure you discuss any questions you have with your health care provider. Document Revised: 06/20/2017 Document Reviewed: 06/20/2017 Elsevier Patient Education  [place] of Breath, Adult Shortness of breath is when a person has trouble breathing enough air or when a person feels like she or he is having trouble breathing in enough air. Shortness of breath could be a sign of a medical problem. Follow these instructions at home:   Pay attention to any changes in your symptoms.  Do not use any products that contain nicotine or tobacco, such as cigarettes, e-cigarettes, and chewing tobacco.  Do not smoke. Smoking is a common cause of shortness of breath. If you need help quitting, ask your health care provider.  Avoid things that can irritate your airways, such as: ? Mold. ? Dust. ? Air pollution. ? Chemical fumes. ? Things that can cause allergy symptoms (allergens), if you have allergies.  Keep your living space clean and free of mold and dust.  Rest as needed. Slowly return to your usual activities.  Take over-the-counter and prescription medicines only as told by your health care provider. This includes oxygen therapy and inhaled medicines.  Keep all follow-up visits as told by your health care provider. This is important. Contact a health care provider if:  Your condition does not improve as soon as expected.  You have a hard time doing your normal activities, even after you rest.  You have new symptoms. Get help right away if:  Your shortness of breath gets worse.  You have shortness of breath when you are resting.  You feel light-headed or you faint.  You have a cough that is not controlled with medicines.  You cough up blood.  You  have pain with breathing.  You have pain in your chest, arms, shoulders, or abdomen.  You have a fever.  You cannot walk up stairs or exercise the way that you normally do. These symptoms may represent a serious problem that is an emergency. Do not wait to see if the symptoms will go away. Get medical help right away. Call your local emergency services (911 in the [place] Do not drive yourself to the hospital. Summary  Shortness of breath is when a person has trouble breathing enough air. It can be a sign of a medical problem.  Avoid things that irritate your lungs, such as smoking, pollution, mold, and dust.  Pay attention to changes in your symptoms and contact your health care provider if you have a hard time completing daily activities because of shortness of breath. This information is not intended to replace advice given to you by your health care provider. Make sure you discuss any questions you have with your health care provider. Document Revised: 10/08/2017 Document Reviewed: 10/08/2017 Elsevier Patient Education  [place].

## 2019-12-03 NOTE — Progress Notes (Addendum)
Subjective:     Terri Gray is a 64 y.o. female and is here for a medicare physical exam. The patient reports problems - several concerns.  Pt has continued pruritic rash on extremities and back and lower extremities.  Seen in March by Dr. Martinique for similar symptoms.  Was given triamcinolone cream which has not helped.  Pt notes skin itches and becomes dry and rash appears.  Denies changes in soaps, lotions or detergents.  Also notes dark area on left lateral leg started out as a rash and change colors and has remained for several weeks.  Taking Synthroid 75 mcg daily.  Pt has an in several months with endocrinology for diabetes.  Pt notes blood sugars at home typically 100 or less.  Highest a.m. blood sugar was 150.  Pt also notes sensation of choking while breathing when laying down at night.  Comes and goes.  Has been better in the last week.  At time of episode pt notes palpitations, HR was 114 laying down.  Sensation feels like it starts stomach.  Pt now sleeping on 6 pillows.  Endorses sinus drainage which makes it difficult to sleep.  Patient denies falls, changes in memory, changes in hearing  Social History   Socioeconomic History  . Marital status: Married    Spouse name: Not on file  . Number of children: 3  . Years of education: 53  . Highest education level: Associate degree: occupational, Hotel manager, or vocational program  Occupational History  . Not on file  Tobacco Use  . Smoking status: Never Smoker  . Smokeless tobacco: Never Used  Vaping Use  . Vaping Use: Never used  Substance and Sexual Activity  . Alcohol use: No  . Drug use: No  . Sexual activity: Not on file  Other Topics Concern  . Not on file  Social History Narrative   HH 3    Married   Son lives with them   1 dog   Enjoys reading, tv, computer   Social Determinants of Health   Financial Resource Strain: Low Risk   . Difficulty of Paying Living Expenses: Not very hard  Food Insecurity: No  Food Insecurity  . Worried About Charity fundraiser in the Last Year: Never true  . Ran Out of Food in the Last Year: Never true  Transportation Needs: No Transportation Needs  . Lack of Transportation (Medical): No  . Lack of Transportation (Non-Medical): No  Physical Activity: Inactive  . Days of Exercise per Week: 0 days  . Minutes of Exercise per Session: 0 min  Stress: Stress Concern Present  . Feeling of Stress : To some extent  Social Connections: Moderately Isolated  . Frequency of Communication with Friends and Family: More than three times a week  . Frequency of Social Gatherings with Friends and Family: Twice a week  . Attends Religious Services: Never  . Active Member of Clubs or Organizations: No  . Attends Archivist Meetings: Never  . Marital Status: Married  Human resources officer Violence:   . Fear of Current or Ex-Partner:   . Emotionally Abused:   Marland Kitchen Physically Abused:   . Sexually Abused:    Health Maintenance  Topic Date Due  . Hepatitis C Screening  Never done  . URINE MICROALBUMIN  Never done  . COVID-19 Vaccine (1) Never done  . HIV Screening  Never done  . TETANUS/TDAP  Never done  . PAP SMEAR-Modifier  Never done  . MAMMOGRAM  Never done  . COLONOSCOPY  Never done  . OPHTHALMOLOGY EXAM  11/29/2018  . FOOT EXAM  07/17/2019  . HEMOGLOBIN A1C  09/09/2019  . INFLUENZA VACCINE  12/21/2019    The following portions of the patient's history were reviewed and updated as appropriate: allergies, current medications, past family history, past medical history, past social history, past surgical history and problem list.  Review of Systems Pertinent items noted in HPI and remainder of comprehensive ROS otherwise negative.   Objective:    BP 120/80   Pulse 74   Temp 98.3 F (36.8 C) (Other (Comment))   Ht 5' 5.75" (1.67 m)   Wt 220 lb (99.8 kg)   SpO2 98%   BMI 35.78 kg/m  General appearance: alert, cooperative and no distress Head:  Normocephalic, without obvious abnormality, atraumatic Eyes: conjunctivae/corneas clear. PERRL, EOM's intact. Fundi benign. Ears: normal TM's and external ear canals both ears Nose: Nares normal. Septum midline. Mucosa normal. No drainage or sinus tenderness. Throat: lips, mucosa, and tongue normal; teeth and gums normal Neck: no adenopathy, no carotid bruit, no JVD, supple, symmetrical, trachea midline and thyroid not enlarged, symmetric, no tenderness/mass/nodules Lungs: clear to auscultation bilaterally Heart: regular rate and rhythm, S1, S2 normal, no murmur, click, rub or gallop Abdomen: soft, non-tender; bowel sounds normal; no masses,  no organomegaly Extremities: extremities normal, atraumatic, no cyanosis or edema Pulses: 2+ and symmetric  Psych: mood appropriate.  Normal affect. Skin: Skin color, texture, turgor normal.  Hypertrophic, dry appearing eczematous rash on RUE.  Hyperpigmented areas with hypopigmented macules on LLE.             Lymph nodes: Cervical, supraclavicular, and axillary nodes normal. Neurologic: Alert and oriented X 3, normal strength and tone. Normal symmetric reflexes. Normal coordination and gait    Assessment:    Pt is a 64 yo  female with numerous concerns this visit.      Plan:     Anticipatory guidance given including wearing seatbelts, smoke detectors in the home, increasing physical activity, increasing p.o. intake of water and vegetables. -will obtain labs -Colonoscopy encouraged as last done in [place].  Referral placed. -advanced directives reviewed -immunizations reviewed. -pt to schedule mammogram -pap encouraged. -given handout -next CPE in 1 yr See After Visit Summary for Counseling Recommendations    Colon cancer screening -referral to Gastroenterology placed.  Palpitations  -consider possible causes including hypo or hyperglycemia, arrhythmia, anemia, thyroid dysfunction, gerd, anxiety. - Plan: TSH, Hemoglobin A1c, EKG  12-Lead, DG Chest 2 View, CBC with Differential/Platelet, Comprehensive metabolic panel  Type 2 diabetes mellitus with diabetic polyneuropathy, with long-term current use of insulin (HCC) -continue current medications: Metformin 1000 mg BID, lantus 86 units in am -continue f/u with Endocrinology  - Plan: Hemoglobin A1c, Lipid panel, POCT urinalysis dipstick, Microalbumin/Creatinine Ratio, Urine, Ambulatory referral to Ophthalmology  Orthopnea  -continue current inhalers - Plan: DG Chest 2 View, Brain Natriuretic Peptide  Tachycardia -will obtain labs -EKG this visit with NSR, HR 60s -will obtain labs -pt encouraged to f/u with Cardiology for continued symptoms  Dermatitis  -Rash on RUE eczematous in appearance. -given multiple joint pain and dermatitis concern for autoimmune d/o.  -will obtain labs. - Plan: Sedimentation Rate, Lupus (SLE) Analysis, C-reactive Protein, hydrocortisone valerate ointment (WESTCORT) 0.2 %  Chronic pain of multiple joints  - Plan: Sedimentation Rate, Rheumatoid Factor, Lupus (SLE) Analysis, C-reactive Protein, Lupus Anticoagulant Eval w/Reflex  Medicare annual wellness visit, subsequent  Acquired hypothyroidism -Continue Synthroid 75 mcg daily -  Continue follow-up with endocrinology, Dr. Loanne Drilling -Plan: TSH  Need for hepatitis C screening test  - Plan: Hep C Antibody  Globus sensation -continue protonix 40 mg daily -Plan: referral to GI  Seasonal allergies  - Plan: fexofenadine (ALLEGRA) 180 MG tablet  F/u in 1 month, sooner if needed.  [name], MD

## 2019-12-06 LAB — CBC WITH DIFFERENTIAL/PLATELET
Absolute Monocytes: 515 cells/uL (ref 200–950)
Basophils Absolute: 37 cells/uL (ref 0–200)
Basophils Relative: 0.6 %
Eosinophils Absolute: 595 cells/uL — ABNORMAL HIGH (ref 15–500)
Eosinophils Relative: 9.6 %
HCT: 37.2 % (ref 35.0–45.0)
Hemoglobin: 11.6 g/dL — ABNORMAL LOW (ref 11.7–15.5)
Lymphs Abs: 2021 cells/uL (ref 850–3900)
MCH: 25.3 pg — ABNORMAL LOW (ref 27.0–33.0)
MCHC: 31.2 g/dL — ABNORMAL LOW (ref 32.0–36.0)
MCV: 81.2 fL (ref 80.0–100.0)
MPV: 11.1 fL (ref 7.5–12.5)
Monocytes Relative: 8.3 %
Neutro Abs: 3032 cells/uL (ref 1500–7800)
Neutrophils Relative %: 48.9 %
Platelets: 324 10*3/uL (ref 140–400)
RBC: 4.58 10*6/uL (ref 3.80–5.10)
RDW: 14.5 % (ref 11.0–15.0)
Total Lymphocyte: 32.6 %
WBC: 6.2 10*3/uL (ref 3.8–10.8)

## 2019-12-06 LAB — COMPREHENSIVE METABOLIC PANEL
AG Ratio: 1.3 (calc) (ref 1.0–2.5)
ALT: 19 U/L (ref 6–29)
AST: 23 U/L (ref 10–35)
Albumin: 3.9 g/dL (ref 3.6–5.1)
Alkaline phosphatase (APISO): 91 U/L (ref 37–153)
BUN: 8 mg/dL (ref 7–25)
CO2: 26 mmol/L (ref 20–32)
Calcium: 9.2 mg/dL (ref 8.6–10.4)
Chloride: 102 mmol/L (ref 98–110)
Creat: 0.82 mg/dL (ref 0.50–0.99)
Globulin: 3.1 g/dL (calc) (ref 1.9–3.7)
Glucose, Bld: 116 mg/dL — ABNORMAL HIGH (ref 65–99)
Potassium: 4.5 mmol/L (ref 3.5–5.3)
Sodium: 139 mmol/L (ref 135–146)
Total Bilirubin: 0.5 mg/dL (ref 0.2–1.2)
Total Protein: 7 g/dL (ref 6.1–8.1)

## 2019-12-06 LAB — LIPID PANEL
Cholesterol: 158 mg/dL (ref <200)
HDL: 44 mg/dL — ABNORMAL LOW (ref >=50)
LDL Cholesterol (Calc): 85 mg/dL (calc)
Non-HDL Cholesterol (Calc): 114 mg/dL (calc) (ref <130)
Total CHOL/HDL Ratio: 3.6 (calc) (ref <5.0)
Triglycerides: 191 mg/dL — ABNORMAL HIGH (ref <150)

## 2019-12-06 LAB — LUPUS ANTICOAGULANT EVAL W/ REFLEX
PTT-LA Screen: 34 s (ref <=40)
dRVVT: 36 s (ref <=45)

## 2019-12-06 LAB — HEPATITIS C ANTIBODY
Hepatitis C Ab: NONREACTIVE
SIGNAL TO CUT-OFF: 0.04 (ref <1.00)

## 2019-12-06 LAB — HEMOGLOBIN A1C
Hgb A1c MFr Bld: 6.8 % of total Hgb — ABNORMAL HIGH (ref <5.7)
Mean Plasma Glucose: 148 (calc)
eAG (mmol/L): 8.2 (calc)

## 2019-12-06 LAB — BRAIN NATRIURETIC PEPTIDE: Brain Natriuretic Peptide: 22 pg/mL (ref <100)

## 2019-12-06 LAB — C-REACTIVE PROTEIN: CRP: 3 mg/L (ref <8.0)

## 2019-12-08 ENCOUNTER — Encounter: Payer: Self-pay | Admitting: Family Medicine

## 2019-12-09 ENCOUNTER — Encounter: Payer: Self-pay | Admitting: Gastroenterology

## 2019-12-15 ENCOUNTER — Ambulatory Visit (INDEPENDENT_AMBULATORY_CARE_PROVIDER_SITE_OTHER)
Admission: RE | Admit: 2019-12-15 | Discharge: 2019-12-15 | Disposition: A | Discharge status: Home / Self Care | Payer: Medicare Other | Source: Ambulatory Visit | Attending: Family Medicine | Admitting: Family Medicine

## 2019-12-15 ENCOUNTER — Other Ambulatory Visit: Payer: Self-pay

## 2019-12-15 DIAGNOSIS — R002 Palpitations: Secondary | ICD-10-CM

## 2019-12-15 DIAGNOSIS — R0602 Shortness of breath: Secondary | ICD-10-CM | POA: Diagnosis not present

## 2019-12-15 DIAGNOSIS — R0601 Orthopnea: Secondary | ICD-10-CM | POA: Diagnosis not present

## 2019-12-15 DIAGNOSIS — K449 Diaphragmatic hernia without obstruction or gangrene: Secondary | ICD-10-CM | POA: Diagnosis not present

## 2019-12-17 ENCOUNTER — Other Ambulatory Visit: Payer: Self-pay

## 2019-12-17 ENCOUNTER — Encounter: Payer: Self-pay | Admitting: Family Medicine

## 2019-12-17 ENCOUNTER — Ambulatory Visit (INDEPENDENT_AMBULATORY_CARE_PROVIDER_SITE_OTHER): Payer: Medicare Other | Admitting: Family Medicine

## 2019-12-17 VITALS — BP 122/80 | HR 74 | Temp 98.5°F | Wt 222.0 lb

## 2019-12-17 DIAGNOSIS — K219 Gastro-esophageal reflux disease without esophagitis: Secondary | ICD-10-CM

## 2019-12-17 DIAGNOSIS — J302 Other seasonal allergic rhinitis: Secondary | ICD-10-CM | POA: Insufficient documentation

## 2019-12-17 DIAGNOSIS — R062 Wheezing: Secondary | ICD-10-CM

## 2019-12-17 DIAGNOSIS — E039 Hypothyroidism, unspecified: Secondary | ICD-10-CM | POA: Insufficient documentation

## 2019-12-17 DIAGNOSIS — M26609 Unspecified temporomandibular joint disorder, unspecified side: Secondary | ICD-10-CM

## 2019-12-17 DIAGNOSIS — Z794 Long term (current) use of insulin: Secondary | ICD-10-CM

## 2019-12-17 DIAGNOSIS — E785 Hyperlipidemia, unspecified: Secondary | ICD-10-CM

## 2019-12-17 DIAGNOSIS — I10 Essential (primary) hypertension: Secondary | ICD-10-CM

## 2019-12-17 DIAGNOSIS — L309 Dermatitis, unspecified: Secondary | ICD-10-CM

## 2019-12-17 MED ORDER — METOPROLOL TARTRATE 25 MG PO TABS: 25.0000 mg | ORAL_TABLET | Freq: Two times a day (BID) | ORAL | 3 refills | Status: DC

## 2019-12-17 MED ORDER — SIMVASTATIN 40 MG PO TABS: 40.0000 mg | ORAL_TABLET | Freq: Every day | ORAL | 3 refills | Status: AC

## 2019-12-17 MED ORDER — CYCLOBENZAPRINE HCL 10 MG PO TABS: 10.0000 mg | ORAL_TABLET | Freq: Three times a day (TID) | ORAL | 3 refills | Status: DC | PRN

## 2019-12-17 MED ORDER — PANTOPRAZOLE SODIUM 40 MG PO TBEC: 40.0000 mg | DELAYED_RELEASE_TABLET | Freq: Every day | ORAL | 3 refills | Status: DC

## 2019-12-17 MED ORDER — LEVOCETIRIZINE DIHYDROCHLORIDE 5 MG PO TABS: 5.0000 mg | ORAL_TABLET | Freq: Every evening | ORAL | 3 refills | Status: DC

## 2019-12-17 MED ORDER — METFORMIN HCL 1000 MG PO TABS: 1000.0000 mg | ORAL_TABLET | Freq: Two times a day (BID) | ORAL | 3 refills | Status: AC

## 2019-12-17 NOTE — Progress Notes (Signed)
Subjective:    Patient ID: Terri Gray, female    DOB: [birth year], 64 y.o.   MRN: [id number]  No chief complaint on file.   HPI Pt is a 64 yo female with pmh sig for history of hypothyroidism was seen today for f/u.  Pt notes continued rash and thin skin on UEs and LLE.  Pt notes less itching of rash since starting Allegra.  Pt denies any changes in soaps, lotions, detergents.  Endorses a h/o sensitive skin.  Pt unsure if allegra helping with allergies still having some postnasal drainage.  Pt notes wheezing and congestion mostly in the evening and early morning.  Pt describes the sensation of head chest feeling heavy with fluid.  Palpitations have improved.  Recent CXR was negative.  Pt requesting refill on Flexeril 3 times daily as needed for TMJ.  Pt also notes intermittent left-sided low back pain.  Notes a h/o arthritis in other joints including R knee  Pt had both Pfizer Covid vaccines on 06/26/2019 and 07/17/2019.  Pt has upcoming appointments with GI, endocrinology, ophthalmology.  Past Medical History:  Diagnosis Date  . Allergy   . Arthritis   . Chicken pox   . Colon polyps   . Diabetes mellitus without complication ([place])   . GERD (gastroesophageal reflux disease)   . Hyperlipidemia   . Hypertension   . Migraines     Allergies  Allergen Reactions  . Gabapentin     Stomach ache    ROS General: Denies fever, chills, night sweats, changes in weight, changes in appetite HEENT: Denies headaches, ear pain, changes in vision, rhinorrhea, sore throat CV: Denies CP, SOB, orthopnea  + palpitations Pulm: Denies SOB, cough  + wheezing GI: Denies abdominal pain, nausea, vomiting, diarrhea, constipation GU: Denies dysuria, hematuria, frequency, vaginal discharge Msk: Denies muscle cramps  +joint pains, left-sided low back pain Neuro: Denies weakness, numbness, tingling Skin: Denies bruising  +rash Psych: Denies depression, anxiety, hallucinations    Objective:    Blood  pressure 122/80, pulse 74, temperature 98.5 F (36.9 C), temperature source Oral, weight (!) 222 lb (100.7 kg), SpO2 98 %.   Gen. Pleasant, well-nourished, in no distress, normal affect   HEENT: [place]/AT, face symmetric, conjunctiva clear, no scleral icterus, PERRLA, EOMI, nares patent without drainage, pharynx without erythema or exudate.  TMs normal bilaterally. Lungs: no accessory muscle use, CTAB, no wheezes or rales Cardiovascular: RRR, no m/r/g, no peripheral edema Musculoskeletal: No deformities, no cyanosis or clubbing, normal tone Neuro:  A&Ox3, CN II-XII intact, normal gait Skin:  Warm, dry, intact.  Dry appearing rash on right forearm eczematous in appearance.  Fine skin colored papules on the left upper extremity.  Hyperpigmented rash on left lower extremity.  Wt Readings from Last 3 Encounters:  12/17/19 (!) 222 lb (100.7 kg)  12/03/19 220 lb (99.8 kg)  10/08/19 221 lb 4 oz (100.4 kg)    Lab Results  Component Value Date   WBC 6.2 12/03/2019   HGB 11.6 (L) 12/03/2019   HCT 37.2 12/03/2019   PLT 324 12/03/2019   GLUCOSE 116 (H) 12/03/2019   CHOL 158 12/03/2019   TRIG 191 (H) 12/03/2019   HDL 44 (L) 12/03/2019   LDLCALC 85 12/03/2019   ALT 19 12/03/2019   AST 23 12/03/2019   NA 139 12/03/2019   K 4.5 12/03/2019   CL 102 12/03/2019   CREATININE 0.82 12/03/2019   BUN 8 12/03/2019   CO2 26 12/03/2019   TSH 0.97 03/11/2019  HGBA1C 6.8 (H) 12/03/2019    Assessment/Plan:  Wheezing  -Lungs clear on exam. -CXR on 12/15/2019 normal -Discussed above results reassuring -Consider postnasal drainage/allergies contributing to wheezing -Discussed referral to pulmonology for PFTs -Given precautions - Plan: Ambulatory referral to Pulmonology  Dermatitis  -Autoimmune screening labs ordered 7/14.  Appears several labs were still active but have not been collected including TSH, microalbumin/creat, ESR, RF, SLE analysis - Plan: Ambulatory referral to Dermatology  Seasonal  allergies  -will d/c Allegra as pt has not noticed much improvement in post nasal drainage. - Plan: levocetirizine (XYZAL) 5 MG tablet  TMJ (temporomandibular joint disorder)  - Plan: cyclobenzaprine (FLEXERIL) 10 MG tablet  F/u as needed  [name], MD

## 2019-12-22 ENCOUNTER — Encounter: Payer: Self-pay | Admitting: Family Medicine

## 2020-01-02 ENCOUNTER — Encounter: Payer: Self-pay | Admitting: Family Medicine

## 2020-01-02 ENCOUNTER — Telehealth (INDEPENDENT_AMBULATORY_CARE_PROVIDER_SITE_OTHER): Payer: Medicare Other | Admitting: Family Medicine

## 2020-01-02 DIAGNOSIS — R0602 Shortness of breath: Secondary | ICD-10-CM

## 2020-01-02 DIAGNOSIS — J4520 Mild intermittent asthma, uncomplicated: Secondary | ICD-10-CM | POA: Diagnosis not present

## 2020-01-02 DIAGNOSIS — J302 Other seasonal allergic rhinitis: Secondary | ICD-10-CM | POA: Diagnosis not present

## 2020-01-02 DIAGNOSIS — L282 Other prurigo: Secondary | ICD-10-CM | POA: Diagnosis not present

## 2020-01-02 DIAGNOSIS — Z7189 Other specified counseling: Secondary | ICD-10-CM | POA: Diagnosis not present

## 2020-01-02 MED ORDER — HYDROXYZINE HCL 25 MG PO TABS: 25.0000 mg | ORAL_TABLET | Freq: Three times a day (TID) | ORAL | 1 refills | Status: DC | PRN

## 2020-01-02 NOTE — Progress Notes (Signed)
Virtual Visit via Video Note  I connected with Terri Gray on 01/02/20 at  8:00 AM EDT by a video enabled telemedicine application 2/2 [id number] pandemic and verified that I am speaking with the correct person using two identifiers.  Location patient: home Location provider:work or home office Persons participating in the virtual visit: patient, provider  I discussed the limitations of evaluation and management by telemedicine and the availability of in person appointments. The patient expressed understanding and agreed to proceed.   HPI: Pt is a 64 yo female with pmh sig for TMJ, arthritis, seasonal allergies, HLD, GERD, HTN, history of migraines, DM, hypothyroidism who was seen for acute concern. Pt states she has pruritic rash all over her body.  States it started Wed evening with red spots. Skin turns white when areas are scratched.  Pt denies current fever.  Thinks the areas look "like a cross between chicken pox and small pox".  Pt denies the lesions having any pus or fluid in them.  States feels like the left side of her throat is swollen.  Denies dysphagia, changes in foods, soaps, lotions, or detergents.  Pt was told next available appointment with dermatology would be in January.  Pt inquires about seeing someone sooner.  Pt still having difficulty with congestion at night.  Feels like she is unable to breath at night.  Notes congestion but unable to get anything out.  Pt has h/o asthma.  It improved when she moved from [place] to [place].  Has an albuterol inhaler.  Not using Symbicort daily.  Patient has an upcoming appointment with pulmonology.  Patient inquires about seeing an allergist.  Patient also inquires on information for Covid testing.     ROS: See pertinent positives and negatives per HPI.  Past Medical History:  Diagnosis Date  . Allergy   . Arthritis   . Chicken pox   . Colon polyps   . Diabetes mellitus without complication ([place])   . GERD (gastroesophageal  reflux disease)   . Hyperlipidemia   . Hypertension   . Migraines     Past Surgical History:  Procedure Laterality Date  . REPLACEMENT TOTAL KNEE Left 2016    Family History  Problem Relation Age of Onset  . Diabetes Father   . Kidney disease Father   . Heart failure Father   . Cancer Mother   . Diabetes Sister   . Diabetes Brother   . Diabetes Sister     Current Outpatient Medications:  .  albuterol (VENTOLIN HFA) 108 (90 Base) MCG/ACT inhaler, Inhale 2 puffs into the lungs every 6 (six) hours as needed for wheezing or shortness of breath., Disp: 18 g, Rfl: 1 .  budesonide-formoterol (SYMBICORT) 160-4.5 MCG/ACT inhaler, Inhale 2 puffs into the lungs 2 (two) times daily., Disp: 1 Inhaler, Rfl: 3 .  chlorhexidine (PERIDEX) 0.12 % solution, 15 mLs 2 (two) times daily., Disp: , Rfl:  .  cyclobenzaprine (FLEXERIL) 10 MG tablet, Take 1 tablet (10 mg total) by mouth 3 (three) times daily as needed for muscle spasms., Disp: 90 tablet, Rfl: 3 .  glucose blood (TRUE METRIX BLOOD GLUCOSE TEST) test strip, 1 each by Other route 4 (four) times daily as needed for other. And lancets 2/day, Disp: 360 each, Rfl: 3 .  HYDROcodone-acetaminophen (NORCO/VICODIN) 5-325 MG tablet, Take 1 tablet by mouth every 6 (six) hours as needed. for pain, Disp: , Rfl:  .  hydrocortisone valerate ointment (WESTCORT) 0.2 %, Apply 1 application topically 2 (two) times daily.,  Disp: 60 g, Rfl: 1 .  ibuprofen (ADVIL) 800 MG tablet, Take 800 mg by mouth every 8 (eight) hours as needed., Disp: , Rfl:  .  LANTUS SOLOSTAR 100 UNIT/ML Solostar Pen, INJECT 85 UNITS SUBCUTANEOUSLY ONCE DAILY IN THE MORNING, Disp: 75 mL, Rfl: 0 .  levocetirizine (XYZAL) 5 MG tablet, Take 1 tablet (5 mg total) by mouth every evening., Disp: 30 tablet, Rfl: 3 .  levothyroxine (SYNTHROID) 75 MCG tablet, Take 1 tablet (75 mcg total) by mouth daily before breakfast., Disp: 90 tablet, Rfl: 3 .  metFORMIN (GLUCOPHAGE) 1000 MG tablet, Take 1 tablet  (1,000 mg total) by mouth 2 (two) times daily with a meal., Disp: 180 tablet, Rfl: 3 .  metoprolol tartrate (LOPRESSOR) 25 MG tablet, Take 1 tablet (25 mg total) by mouth 2 (two) times daily., Disp: 180 tablet, Rfl: 3 .  pantoprazole (PROTONIX) 40 MG tablet, Take 1 tablet (40 mg total) by mouth daily., Disp: 90 tablet, Rfl: 3 .  predniSONE (DELTASONE) 20 MG tablet, 2 tabs for 3 days, 1 tabs for 3 days, 1/2 tabs for 3 days. Take tables together with breakfast., Disp: 12 tablet, Rfl: 0 .  simvastatin (ZOCOR) 40 MG tablet, Take 1 tablet (40 mg total) by mouth daily., Disp: 90 tablet, Rfl: 3 .  triamcinolone cream (KENALOG) 0.1 %, Apply 1 application topically 2 (two) times daily as needed., Disp: 45 g, Rfl: 0  EXAM:  VITALS per patient if applicable:  37-34 bpm  GENERAL: alert, oriented, appears well and in no acute distress  HEENT: atraumatic, conjunctiva clear, no obvious abnormalities on inspection of external nose and ears  NECK: normal movements of the head and neck  LUNGS: on inspection no signs of respiratory distress, breathing rate appears normal, no obvious gross SOB, gasping or wheezing  SKIN: unable to see reported rash clearly via video  CV: no obvious cyanosis  MS: moves all visible extremities without noticeable abnormality  PSYCH/NEURO: pleasant and cooperative, no obvious depression or anxiety, speech and thought processing grossly intact  ASSESSMENT AND PLAN:  Discussed the following assessment and plan:  Pruritic rash  -pt to try and upload photo of rash on arm to mychart for review as unable to see rash clearly during visit. -ok to use hydrocortisone cream as needed for refractory areas. -will d/c xyzal as not working -given precuations - Plan: hydrOXYzine (ATARAX/VISTARIL) 25 MG tablet, Ambulatory referral to Allergy  SOB (shortness of breath)  -discussed possible causes including reactive airway disease, GERD, seasonal allergies -CXR 12/15/19 negative -Pt  has appointment with pulmonology.  Encouraged to keep. -Discussed d/c'ing Xyzal as it is not providing any relief in symptoms -Continue nasal spray. -Given precautions - Plan: Ambulatory referral to Allergy  Mild intermittent asthma, unspecified whether complicated  -advised to take Symbicort daily -Use albuterol inhaler as needed -CXR 12/15/19 negative. -continue flonase.  consider saline nasal rinse - Plan: Ambulatory referral to Allergy  Seasonal allergies -Discussed d/c'ing sizable and has not affected -Okay to continue nasal spray such as Flonase or saline nasal rinse - Plan: Ambulatory referral to Allergy  Educated about COVID-19 virus infection -Discussed signs and symptoms of COVID-19 virus infection -Given information about area testing locations  F/u prn  I discussed the assessment and treatment plan with the patient. The patient was provided an opportunity to ask questions and all were answered. The patient agreed with the plan and demonstrated an understanding of the instructions.   The patient was advised to call back or seek  an in-person evaluation if the symptoms worsen or if the condition fails to improve as anticipated.  I provided 23 minutes of non-face-to-face time during this encounter.   [name], MD

## 2020-01-12 DIAGNOSIS — H16223 Keratoconjunctivitis sicca, not specified as Sjogren's, bilateral: Secondary | ICD-10-CM | POA: Diagnosis not present

## 2020-01-12 DIAGNOSIS — H43813 Vitreous degeneration, bilateral: Secondary | ICD-10-CM | POA: Diagnosis not present

## 2020-01-12 DIAGNOSIS — E119 Type 2 diabetes mellitus without complications: Secondary | ICD-10-CM | POA: Diagnosis not present

## 2020-01-12 DIAGNOSIS — H2513 Age-related nuclear cataract, bilateral: Secondary | ICD-10-CM | POA: Diagnosis not present

## 2020-01-28 DIAGNOSIS — E782 Mixed hyperlipidemia: Secondary | ICD-10-CM | POA: Diagnosis not present

## 2020-01-28 DIAGNOSIS — E1142 Type 2 diabetes mellitus with diabetic polyneuropathy: Secondary | ICD-10-CM | POA: Diagnosis not present

## 2020-01-28 DIAGNOSIS — I10 Essential (primary) hypertension: Secondary | ICD-10-CM | POA: Diagnosis not present

## 2020-01-28 DIAGNOSIS — Z794 Long term (current) use of insulin: Secondary | ICD-10-CM | POA: Diagnosis not present

## 2020-02-03 ENCOUNTER — Ambulatory Visit: Payer: Medicare Other | Admitting: Allergy and Immunology

## 2020-02-03 ENCOUNTER — Other Ambulatory Visit: Payer: Self-pay

## 2020-02-03 ENCOUNTER — Encounter: Payer: Self-pay | Admitting: Allergy and Immunology

## 2020-02-03 VITALS — BP 146/86 | HR 85 | Temp 98.2°F | Resp 18 | Ht 65.0 in | Wt 218.4 lb

## 2020-02-03 DIAGNOSIS — H1013 Acute atopic conjunctivitis, bilateral: Secondary | ICD-10-CM

## 2020-02-03 DIAGNOSIS — L500 Allergic urticaria: Secondary | ICD-10-CM | POA: Diagnosis not present

## 2020-02-03 DIAGNOSIS — H1010 Acute atopic conjunctivitis, unspecified eye: Secondary | ICD-10-CM | POA: Insufficient documentation

## 2020-02-03 DIAGNOSIS — J4540 Moderate persistent asthma, uncomplicated: Secondary | ICD-10-CM | POA: Diagnosis not present

## 2020-02-03 DIAGNOSIS — J3089 Other allergic rhinitis: Secondary | ICD-10-CM | POA: Diagnosis not present

## 2020-02-03 MED ORDER — LEVOCETIRIZINE DIHYDROCHLORIDE 5 MG PO TABS
ORAL_TABLET | ORAL | 5 refills | Status: DC
Start: 2020-02-03 — End: 2020-12-21

## 2020-02-03 MED ORDER — OLOPATADINE HCL 0.2 % OP SOLN: 1.0000 [drp] | Freq: Every day | OPHTHALMIC | 5 refills | Status: DC | PRN

## 2020-02-03 MED ORDER — FAMOTIDINE 20 MG PO TABS: 20.0000 mg | ORAL_TABLET | Freq: Two times a day (BID) | ORAL | 5 refills | Status: DC | PRN

## 2020-02-03 MED ORDER — FLUTICASONE PROPIONATE 50 MCG/ACT NA SUSP: 1.0000 | Freq: Every day | NASAL | 5 refills | Status: AC | PRN

## 2020-02-03 NOTE — Assessment & Plan Note (Signed)
   Continue Symbicort 160-4.5 g, 2 inhalations via spacer device twice daily.  Continue albuterol HFA, 1 to 2 inhalations every 4-6 hours if needed.  Subjective and objective measures of pulmonary function will be followed and the treatment plan will be adjusted accordingly.

## 2020-02-03 NOTE — Assessment & Plan Note (Addendum)
Unclear etiology. Skin tests to select food allergens were negative today. NSAIDs and emotional stress commonly exacerbate urticaria but are not the underlying etiology in this case. Physical urticarias are negative by history (i.e. pressure-induced, temperature, vibration, solar, etc.). History and lesions are not consistent with urticaria pigmentosa so I am not suspicious for mastocytosis. There are no concomitant symptoms concerning for anaphylaxis or constitutional symptoms worrisome for an underlying malignancy. We will rule out other potential etiologies with labs. For symptom relief, patient is to take oral antihistamines as directed.  The following labs have been ordered: FCeRI antibody, anti-thyroglobulin antibody, thyroid peroxidase antibody, tryptase, CBC, CMP, ESR, ANA, and alpha-gal panel.  The patient will be called with further recommendations after lab results have returned.  Instructions have been discussed and provided for H1/H2 receptor blockade with titration to find lowest effective dose.  A prescription has been provided for levocetirizine (Xyzal), 5 mg daily as needed.  A prescription has been provided for famotidine (Pepcid), 20 mg twice daily as needed.  Should there be a significant increase or change in symptoms, a journal is to be kept recording any foods eaten, beverages consumed, medications taken within a 6 hour period prior to the onset of symptoms, as well as record activities being performed, and environmental conditions. For any symptoms concerning for anaphylaxis, 911 is to be called immediately.  If the labs are unrevealing, further evaluation by dermatologist with a skin biopsy may be helpful.

## 2020-02-03 NOTE — Progress Notes (Signed)
New Patient Note  RE: Terri Gray MRN: [id number] DOB: May 24, 1955 Date of Office Visit: 02/03/2020  Referring provider: [name], MD Primary care provider: [name], MD  Chief Complaint: Rash, Asthma, and Allergic Rhinitis    History of present illness: Terri Gray is a 64 y.o. female seen today in consultation requested by [name], MD.  She reports that she had the Garey vaccine injections in February and reports that she began to experience persistent rhinorrhea and her nose and upper lip turned red and began to peel.  She then began to develop a rash on her back, upper extremities, and lower extremities.  The rash is described hives/welts which "itched really, really bad."  She went to see her primary care physician in April for this issue and was prescribed topical creams without significant benefit.  She reports that after scratching the rash "the skin just started to peel off."  She now has scarring on her arms.  She no longer develops the hives/welts but reports that the skin is still pruritic and "feels really, really dry and rough."  Other than her question of the Covid vaccine having triggered the symptoms, no specific medication, food, skin care product, detergent, soap, or other environmental triggers have been identified. Terri Gray reports that she was diagnosed with asthma as a child.  She required emergency room evaluation and treatment in 1986 for a "really bad asthma attack."  She is currently taking Symbicort 160-4.5 g, 2 inhalations via spacer device twice daily.  While on this regimen, she typically experiences asthma symptoms 1-2 times per week on average, does not experience limitations in normal daily activities, and rarely experiences nocturnal awakenings due to lower respiratory symptoms.  Her asthma symptoms are temporarily relieved with albuterol. The patient experiences nasal congestion, rhinorrhea, sneezing, nasal pruritus, ocular  pruritus, and ear canal pruritus.  The symptoms occur year-round but are more frequent during the summertime with pollen exposure.  She attempts to control the symptoms with loratadine as needed.  Assessment and plan: Urticaria Unclear etiology. Skin tests to select food allergens were negative today. NSAIDs and emotional stress commonly exacerbate urticaria but are not the underlying etiology in this case. Physical urticarias are negative by history (i.e. pressure-induced, temperature, vibration, solar, etc.). History and lesions are not consistent with urticaria pigmentosa so I am not suspicious for mastocytosis. There are no concomitant symptoms concerning for anaphylaxis or constitutional symptoms worrisome for an underlying malignancy. We will rule out other potential etiologies with labs. For symptom relief, patient is to take oral antihistamines as directed.  The following labs have been ordered: FCeRI antibody, anti-thyroglobulin antibody, thyroid peroxidase antibody, tryptase, CBC, CMP, ESR, ANA, and alpha-gal panel.  The patient will be called with further recommendations after lab results have returned.  Instructions have been discussed and provided for H1/H2 receptor blockade with titration to find lowest effective dose.  A prescription has been provided for levocetirizine (Xyzal), 5 mg daily as needed.  A prescription has been provided for famotidine (Pepcid), 20 mg twice daily as needed.  Should there be a significant increase or change in symptoms, a journal is to be kept recording any foods eaten, beverages consumed, medications taken within a 6 hour period prior to the onset of symptoms, as well as record activities being performed, and environmental conditions. For any symptoms concerning for anaphylaxis, 911 is to be called immediately.  If the labs are unrevealing, further evaluation by dermatologist with a skin biopsy may be  helpful.  Seasonal and perennial allergic  rhinitis  Aeroallergen avoidance measures have been discussed and provided in written form.  Levocetirizine has been prescribed (as above).  A prescription has been provided for fluticasone nasal spray, 1 to 2 sprays per nostril daily as needed. Proper nasal spray technique has been discussed and demonstrated.  Nasal saline spray (i.e. Simply Saline) is recommended prior to medicated nasal sprays and as needed.  Allergic conjunctivitis  Treatment plan as outlined above for allergic rhinitis.  A prescription has been provided for generic Pataday, one drop per eye daily as needed.  If insurance does not cover this medication, medicated allergy eyedrops may be purchased over-the-counter as Restaurant manager, fast food.  I have also recommended eye lubricant drops (i.e., Natural Tears) as needed.  Moderate persistent asthma  Continue Symbicort 160-4.5 g, 2 inhalations via spacer device twice daily.  Continue albuterol HFA, 1 to 2 inhalations every 4-6 hours if needed.  Subjective and objective measures of pulmonary function will be followed and the treatment plan will be adjusted accordingly.   Meds ordered this encounter  Medications  . Olopatadine HCl (PATADAY) 0.2 % SOLN    Sig: Place 1 drop into both eyes daily as needed.    Dispense:  2.5 mL    Refill:  5  . fluticasone (FLONASE) 50 MCG/ACT nasal spray    Sig: Place 1-2 sprays into both nostrils daily as needed.    Dispense:  16 g    Refill:  5  . levocetirizine (XYZAL) 5 MG tablet    Sig: May take 1 tablet once or twice a day as needed    Dispense:  60 tablet    Refill:  5  . famotidine (PEPCID) 20 MG tablet    Sig: Take 1 tablet (20 mg total) by mouth 2 (two) times daily as needed.    Dispense:  60 tablet    Refill:  5    Diagnostics: Spirometry: Spirometry reveals an FVC of 1.70 L and an FEV1 of 1.52 L (67% predicted) with significant (310 mL, 20%) postbronchodilator improvement.  This study was performed  while the patient was asymptomatic.  Please see scanned spirometry results for details. Epicutaneous environmental testing: Negative despite a positive histamine control. Intradermal testing: Reactive to [place] grass, tree mix, and major mold mix #1. Food allergen skin testing: Negative despite a positive histamine control.    Physical examination: Blood pressure (!) 146/86, pulse 85, temperature 98.2 F (36.8 C), temperature source Oral, resp. rate 18, height _0  (1.651 m), weight 218 lb 6.4 oz (99.1 kg), SpO2 99 %.  General: Alert, interactive, in no acute distress. HEENT: TMs pearly gray, turbinates moderately edematous without discharge, post-pharynx mildly erythematous. Neck: Supple without lymphadenopathy. Lungs: Clear to auscultation without wheezing, rhonchi or rales. CV: Normal S1, S2 without murmurs. Abdomen: Nondistended, nontender. Skin: Hyperpigmented, mildly thickened patches on the doral forearms bilaterally. Extremities:  No clubbing, cyanosis or edema. Neuro:   Grossly intact.  Review of systems:  Review of systems negative except as noted in HPI / PMHx or noted below: Review of Systems  Constitutional: Negative.   HENT: Negative.   Eyes: Negative.   Respiratory: Negative.   Cardiovascular: Negative.   Gastrointestinal: Negative.   Genitourinary: Negative.   Musculoskeletal: Negative.   Skin: Negative.   Neurological: Negative.   Endo/Heme/Allergies: Negative.   Psychiatric/Behavioral: Negative.     Past medical history:  Past Medical History:  Diagnosis Date  . Allergy   . Arthritis   .  Chicken pox   . Colon polyps   . Diabetes mellitus without complication ([place])   . GERD (gastroesophageal reflux disease)   . Hyperlipidemia   . Hypertension   . Migraines   . Shingles 2012    Past surgical history:  Past Surgical History:  Procedure Laterality Date  . REPLACEMENT TOTAL KNEE Left 2016    Family history: Family History  Problem Relation  Age of Onset  . Diabetes Father   . Kidney disease Father   . Heart failure Father   . Cancer Mother   . Bronchitis Mother   . Diabetes Sister   . Asthma Sister   . Urticaria Sister   . Diabetes Brother   . Diabetes Sister   . Allergic rhinitis Son   . Allergic rhinitis Daughter   . Allergic rhinitis Grandchild   . Food Allergy Grandchild   . Immunodeficiency Neg Hx   . Angioedema Neg Hx     Social history: Social History   Socioeconomic History  . Marital status: Married    Spouse name: Not on file  . Number of children: 3  . Years of education: 33  . Highest education level: Associate degree: occupational, Hotel manager, or vocational program  Occupational History  . Not on file  Tobacco Use  . Smoking status: Never Smoker  . Smokeless tobacco: Never Used  Vaping Use  . Vaping Use: Never used  Substance and Sexual Activity  . Alcohol use: No  . Drug use: No  . Sexual activity: Not on file  Other Topics Concern  . Not on file  Social History Narrative   HH 3    Married   Son lives with them   1 dog   Enjoys reading, tv, computer   Social Determinants of Health   Financial Resource Strain: Low Risk   . Difficulty of Paying Living Expenses: Not very hard  Food Insecurity: No Food Insecurity  . Worried About Charity fundraiser in the Last Year: Never true  . Ran Out of Food in the Last Year: Never true  Transportation Needs: No Transportation Needs  . Lack of Transportation (Medical): No  . Lack of Transportation (Non-Medical): No  Physical Activity: Inactive  . Days of Exercise per Week: 0 days  . Minutes of Exercise per Session: 0 min  Stress: Stress Concern Present  . Feeling of Stress : To some extent  Social Connections: Moderately Isolated  . Frequency of Communication with Friends and Family: More than three times a week  . Frequency of Social Gatherings with Friends and Family: Twice a week  . Attends Religious Services: Never  . Active Member of  Clubs or Organizations: No  . Attends Archivist Meetings: Never  . Marital Status: Married  Human resources officer Violence:   . Fear of Current or Ex-Partner: Not on file  . Emotionally Abused: Not on file  . Physically Abused: Not on file  . Sexually Abused: Not on file    Environmental History: The patient lives in a 65 year old house with carpeting the bedroom, gas heat, and central air.  There is a dog in the home which does not have access to her bedroom.  There is no known mold/water damage in the home.  She is a non-smoker.  Current Outpatient Medications  Medication Sig Dispense Refill  . albuterol (VENTOLIN HFA) 108 (90 Base) MCG/ACT inhaler Inhale 2 puffs into the lungs every 6 (six) hours as needed for wheezing or shortness of breath.  18 g 1  . budesonide-formoterol (SYMBICORT) 160-4.5 MCG/ACT inhaler Inhale 2 puffs into the lungs 2 (two) times daily. 1 Inhaler 3  . cyclobenzaprine (FLEXERIL) 10 MG tablet Take 1 tablet (10 mg total) by mouth 3 (three) times daily as needed for muscle spasms. 90 tablet 3  . glucose blood (TRUE METRIX BLOOD GLUCOSE TEST) test strip 1 each by Other route 4 (four) times daily as needed for other. And lancets 2/day 360 each 3  . hydrocortisone valerate ointment (WESTCORT) 0.2 % Apply 1 application topically 2 (two) times daily. 60 g 1  . hydrOXYzine (ATARAX/VISTARIL) 25 MG tablet Take 1 tablet (25 mg total) by mouth 3 (three) times daily as needed for itching. 30 tablet 1  . ibuprofen (ADVIL) 800 MG tablet Take 800 mg by mouth every 8 (eight) hours as needed.    Marland Kitchen LANTUS SOLOSTAR 100 UNIT/ML Solostar Pen INJECT 85 UNITS SUBCUTANEOUSLY ONCE DAILY IN THE MORNING 75 mL 0  . levocetirizine (XYZAL) 5 MG tablet May take 1 tablet once or twice a day as needed 60 tablet 5  . levothyroxine (SYNTHROID) 75 MCG tablet Take 1 tablet (75 mcg total) by mouth daily before breakfast. 90 tablet 3  . metFORMIN (GLUCOPHAGE) 1000 MG tablet Take 1 tablet (1,000 mg  total) by mouth 2 (two) times daily with a meal. 180 tablet 3  . metoprolol tartrate (LOPRESSOR) 25 MG tablet Take 1 tablet (25 mg total) by mouth 2 (two) times daily. 180 tablet 3  . pantoprazole (PROTONIX) 40 MG tablet Take 1 tablet (40 mg total) by mouth daily. 90 tablet 3  . Semaglutide,0.25 or 0.5MG/DOS, 2 MG/1.5ML SOPN Inject into the skin. Ozempic    . simvastatin (ZOCOR) 40 MG tablet Take 1 tablet (40 mg total) by mouth daily. 90 tablet 3  . triamcinolone cream (KENALOG) 0.1 % Apply 1 application topically 2 (two) times daily as needed. 45 g 0  . famotidine (PEPCID) 20 MG tablet Take 1 tablet (20 mg total) by mouth 2 (two) times daily as needed. 60 tablet 5  . fluticasone (FLONASE) 50 MCG/ACT nasal spray Place 1-2 sprays into both nostrils daily as needed. 16 g 5  . Olopatadine HCl (PATADAY) 0.2 % SOLN Place 1 drop into both eyes daily as needed. 2.5 mL 5   No current facility-administered medications for this visit.    Known medication allergies: Allergies  Allergen Reactions  . Gabapentin     Stomach ache    I appreciate the opportunity to take part in Terri Gray's care. Please do not hesitate to contact me with questions.  Sincerely,   R. Edgar Frisk, MD

## 2020-02-03 NOTE — Assessment & Plan Note (Signed)
   Aeroallergen avoidance measures have been discussed and provided in written form.  Levocetirizine has been prescribed (as above).  A prescription has been provided for fluticasone nasal spray, 1 to 2 sprays per nostril daily as needed. Proper nasal spray technique has been discussed and demonstrated.  Nasal saline spray (i.e. Simply Saline) is recommended prior to medicated nasal sprays and as needed.

## 2020-02-03 NOTE — Patient Instructions (Addendum)
Urticaria Unclear etiology. Skin tests to select food allergens were negative today. NSAIDs and emotional stress commonly exacerbate urticaria but are not the underlying etiology in this case. Physical urticarias are negative by history (i.e. pressure-induced, temperature, vibration, solar, etc.). History and lesions are not consistent with urticaria pigmentosa so I am not suspicious for mastocytosis. There are no concomitant symptoms concerning for anaphylaxis or constitutional symptoms worrisome for an underlying malignancy. We will rule out other potential etiologies with labs. For symptom relief, patient is to take oral antihistamines as directed.  The following labs have been ordered: FCeRI antibody, anti-thyroglobulin antibody, thyroid peroxidase antibody, tryptase, CBC, CMP, ESR, ANA, and alpha-gal panel.  The patient will be called with further recommendations after lab results have returned.  Instructions have been discussed and provided for H1/H2 receptor blockade with titration to find lowest effective dose.  A prescription has been provided for levocetirizine (Xyzal), 5 mg daily as needed.  A prescription has been provided for famotidine (Pepcid), 20 mg twice daily as needed.  Should there be a significant increase or change in symptoms, a journal is to be kept recording any foods eaten, beverages consumed, medications taken within a 6 hour period prior to the onset of symptoms, as well as record activities being performed, and environmental conditions. For any symptoms concerning for anaphylaxis, 911 is to be called immediately.  If the labs are unrevealing, further evaluation by dermatologist with a skin biopsy may be helpful.  Seasonal and perennial allergic rhinitis  Aeroallergen avoidance measures have been discussed and provided in written form.  Levocetirizine has been prescribed (as above).  A prescription has been provided for fluticasone nasal spray, 1 to 2 sprays per  nostril daily as needed. Proper nasal spray technique has been discussed and demonstrated.  Nasal saline spray (i.e. Simply Saline) is recommended prior to medicated nasal sprays and as needed.  Allergic conjunctivitis  Treatment plan as outlined above for allergic rhinitis.  A prescription has been provided for generic Pataday, one drop per eye daily as needed.  If insurance does not cover this medication, medicated allergy eyedrops may be purchased over-the-counter as Restaurant manager, fast food.  I have also recommended eye lubricant drops (i.e., Natural Tears) as needed.  Moderate persistent asthma  Continue Symbicort 160-4.5 g, 2 inhalations via spacer device twice daily.  Continue albuterol HFA, 1 to 2 inhalations every 4-6 hours if needed.  Subjective and objective measures of pulmonary function will be followed and the treatment plan will be adjusted accordingly.   When lab results have returned you will be called with further recommendations. With the newly implemented Cures Act, the labs may be visible to you at the same time they become visible to [place]. However, the results will typically not be addressed until all of the results are back, so please be patient.  Until you have heard from [place], please continue the treatment plan as outlined on your take home sheet.  Urticaria (Hives)  . Levocetirizine (Xyzal) 5 mg twice a day and famotidine (Pepcid) 20 mg twice a day. If no symptoms for 7-14 days then decrease to. . Levocetirizine (Xyzal) 5 mg twice a day and famotidine (Pepcid) 20 mg once a day.  If no symptoms for 7-14 days then decrease to. . Levocetirizine (Xyzal) 5 mg twice a day.  If no symptoms for 7-14 days then decrease to. . Levocetirizine (Xyzal) 5 mg once a day.  May use Benadryl (diphenhydramine) as needed for breakthrough symptoms  If symptoms return, then step up dosage  Reducing Pollen Exposure    1. Use nasal saline spray (i.e., Simply Saline)  as needed. 2. Use eye lubricant drops (i.e., Natural Tears) as needed. 3. Do not hang sheets or clothing out to dry; pollen may collect on these items. 4. Do not mow lawns or spend time around freshly cut grass; mowing stirs up pollen. 5. Keep windows closed at night.  Keep car windows closed while driving. 6. Minimize morning activities outdoors, a time when pollen counts are usually at their highest. 7. Stay indoors as much as possible when pollen counts or humidity is high and on windy days when pollen tends to remain in the air longer. 8. Use air conditioning when possible.  Many air conditioners have filters that trap the pollen spores. 9. Use a HEPA room air filter to remove pollen form the indoor air you breathe.   Control of Mold Allergen  Mold and fungi can grow on a variety of surfaces provided certain temperature and moisture conditions exist.  Outdoor molds grow on plants, decaying vegetation and soil.  The major outdoor mold, Alternaria and Cladosporium, are found in very high numbers during hot and dry conditions.  Generally, a late Summer - Fall peak is seen for common outdoor fungal spores.  Rain will temporarily lower outdoor mold spore count, but counts rise rapidly when the rainy period ends.  The most important indoor molds are Aspergillus and Penicillium.  Dark, humid and poorly ventilated basements are ideal sites for mold growth.  The next most common sites of mold growth are the bathroom and the kitchen.  Outdoor [employer] 1. Use air conditioning and keep windows closed 2. Avoid exposure to decaying vegetation. 3. Avoid leaf raking. 4. Avoid grain handling. 5. Consider wearing a face mask if working in moldy areas.  Indoor Mold Control 1. Maintain humidity below 50%. 2. Clean washable surfaces with 5% bleach solution. 3. Remove sources e.g. Contaminated carpets.

## 2020-02-03 NOTE — Assessment & Plan Note (Signed)
   Treatment plan as outlined above for allergic rhinitis.  A prescription has been provided for generic Pataday, one drop per eye daily as needed.  If insurance does not cover this medication, medicated allergy eyedrops may be purchased over-the-counter as Pataday Extra Strength or Zaditor.  I have also recommended eye lubricant drops (i.e., Natural Tears) as needed. 

## 2020-02-05 DIAGNOSIS — H1013 Acute atopic conjunctivitis, bilateral: Secondary | ICD-10-CM | POA: Diagnosis not present

## 2020-02-05 DIAGNOSIS — L500 Allergic urticaria: Secondary | ICD-10-CM | POA: Diagnosis not present

## 2020-02-05 DIAGNOSIS — J4540 Moderate persistent asthma, uncomplicated: Secondary | ICD-10-CM | POA: Diagnosis not present

## 2020-02-06 ENCOUNTER — Encounter: Payer: Self-pay | Admitting: Gastroenterology

## 2020-02-06 ENCOUNTER — Ambulatory Visit: Payer: Medicare Other | Admitting: Gastroenterology

## 2020-02-06 VITALS — BP 148/94 | HR 77 | Ht 65.0 in | Wt 217.2 lb

## 2020-02-06 DIAGNOSIS — K449 Diaphragmatic hernia without obstruction or gangrene: Secondary | ICD-10-CM

## 2020-02-06 DIAGNOSIS — K219 Gastro-esophageal reflux disease without esophagitis: Secondary | ICD-10-CM

## 2020-02-06 DIAGNOSIS — R1084 Generalized abdominal pain: Secondary | ICD-10-CM

## 2020-02-06 DIAGNOSIS — R1310 Dysphagia, unspecified: Secondary | ICD-10-CM

## 2020-02-06 DIAGNOSIS — R198 Other specified symptoms and signs involving the digestive system and abdomen: Secondary | ICD-10-CM | POA: Diagnosis not present

## 2020-02-06 LAB — SEDIMENTATION RATE: Sed Rate: 43 mm/hr — ABNORMAL HIGH (ref 0–40)

## 2020-02-06 LAB — THYROID PEROXIDASE ANTIBODY: Thyroperoxidase Ab SerPl-aCnc: <8 IU/mL (ref 0–34)

## 2020-02-06 MED ORDER — CLENPIQ 10-3.5-12 MG-GM -GM/160ML PO SOLN: 1.0000 | ORAL | 0 refills | Status: DC

## 2020-02-06 NOTE — Progress Notes (Signed)
Chief Complaint: Hiatal hernia, GERD, change in bowel habits, abdominal pain  Referring Provider:     [name], MD  HPI:    Terri Gray is a 64 y.o. female with a history of diabetes, HTN, HLD, hypothyroidism, GERD, migraines, OSA (on CPAP), referred to the [facility] for evaluation of GERD along with evaluation of abdominal pain, change in bowel habits.  Index sxs of HB, regurgitaiton, sour brash. Many years. Worse with greasy, spicy foods. Usually within 1 hour of eating. Previously treated with Dexilant, which worked well, but changed to Protonix 40 mg/day a few years ago with suboptimal response. Will occasional take a 2nd dose. Recently started on Pepcid 20 mg/day, but more so for rash (was recently seen by [facility]).   Separately, reports changes in bowel habits and increased flatus. BM alternates between diarrhea and constipation. Previously diagnosed with IBS-C, but the diarrhea is new. No hematochezia or melena. Can have associated generalized abd pain, cramping, and bloating. Has trialed Gas-X, Bean-O, stool softeners, and anti-diarrheals over last few months, with minimal noted changes.    Dry mouth started and occasional frothy, "chalky" mouth. Worse with dairy and fruits.  Also with intemrittent dysphagia and choking sensation. Dysphagia can be solids or liquids, pointing to anterior neck. Sxs present last 3-4 months.   Previously followed with a Copywriter, advertising in [place].  Reports previously being diagnosed with IBS along with GERD with hiatal hernia.  EGD/colonoscopy completed around 2014.  Has a history of colon polyps on last colonoscopy, but unknown size, location, histology, etc, and doesn't know when was told to repeat. No reports available for review.  -02/05/20: Normal CBC, CMP, negative ANA  For her diabetes, she follows with Endocrinology at [facility].  No known family history of CRC, GI malignancy, liver disease,  pancreatic disease, or IBD.   Past Medical History:  Diagnosis Date  . Allergy   . Arthritis   . Asthma   . Chicken pox   . Colon polyps   . Diabetes mellitus without complication ([place])   . GERD (gastroesophageal reflux disease)   . H/O fracture of nose    Broken by ENT  . History of colon polyps   . Hyperlipidemia   . Hypertension   . IBS (irritable bowel syndrome)   . Migraines   . Obesity   . Shingles 2012  . Sleep apnea    on CPAP machine but uses it 90% of the time because it gives her some complication and does a mouth guard as well  . Thyroid disease      Past Surgical History:  Procedure Laterality Date  . COLONOSCOPY     Around 2014- 2015 in [place]  . ESOPHAGOGASTRODUODENOSCOPY     Around 2014-2015 [place]  . REPLACEMENT TOTAL KNEE Left 2016   Family History  Problem Relation Age of Onset  . Diabetes Father   . Kidney disease Father   . Heart failure Father   . Irritable bowel syndrome Father   . Bronchitis Mother   . Ovarian cancer Mother 22  . GER disease Mother   . Colon polyps Mother   . Irritable bowel syndrome Mother   . Diabetes Sister   . Asthma Sister   . Urticaria Sister   . Diabetes Brother   . Diabetes Sister   . Allergic rhinitis Son   . Allergic rhinitis Daughter   . GER disease Daughter   .  Irritable bowel syndrome Daughter   . Allergic rhinitis Grandchild   . Food Allergy Grandchild   . Irritable bowel syndrome Daughter   . Immunodeficiency Neg Hx   . Angioedema Neg Hx    Social History   Tobacco Use  . Smoking status: Never Smoker  . Smokeless tobacco: Never Used  Vaping Use  . Vaping Use: Never used  Substance Use Topics  . Alcohol use: No    Comment: last drink was over 30 years ago  . Drug use: No   Current Outpatient Medications  Medication Sig Dispense Refill  . albuterol (VENTOLIN HFA) 108 (90 Base) MCG/ACT inhaler Inhale 2 puffs into the lungs every 6 (six) hours as needed for wheezing or  shortness of breath. 18 g 1  . budesonide-formoterol (SYMBICORT) 160-4.5 MCG/ACT inhaler Inhale 2 puffs into the lungs 2 (two) times daily. 1 Inhaler 3  . cyclobenzaprine (FLEXERIL) 10 MG tablet Take 1 tablet (10 mg total) by mouth 3 (three) times daily as needed for muscle spasms. 90 tablet 3  . famotidine (PEPCID) 20 MG tablet Take 1 tablet (20 mg total) by mouth 2 (two) times daily as needed. 60 tablet 5  . fluticasone (FLONASE) 50 MCG/ACT nasal spray Place 1-2 sprays into both nostrils daily as needed. 16 g 5  . glucose blood (TRUE METRIX BLOOD GLUCOSE TEST) test strip 1 each by Other route 4 (four) times daily as needed for other. And lancets 2/day 360 each 3  . hydrocortisone valerate ointment (WESTCORT) 0.2 % Apply 1 application topically 2 (two) times daily. 60 g 1  . hydrOXYzine (ATARAX/VISTARIL) 25 MG tablet Take 1 tablet (25 mg total) by mouth 3 (three) times daily as needed for itching. (Patient taking differently: Take 25 mg by mouth as needed for itching. ) 30 tablet 1  . ibuprofen (ADVIL) 800 MG tablet Take 800 mg by mouth every 8 (eight) hours as needed.    Marland Kitchen LANTUS SOLOSTAR 100 UNIT/ML Solostar Pen INJECT 85 UNITS SUBCUTANEOUSLY ONCE DAILY IN THE MORNING 75 mL 0  . levocetirizine (XYZAL) 5 MG tablet May take 1 tablet once or twice a day as needed (Patient taking differently: Take by mouth as needed. May take 1 tablet once or twice a day as needed) 60 tablet 5  . levothyroxine (SYNTHROID) 75 MCG tablet Take 1 tablet (75 mcg total) by mouth daily before breakfast. 90 tablet 3  . metFORMIN (GLUCOPHAGE) 1000 MG tablet Take 1 tablet (1,000 mg total) by mouth 2 (two) times daily with a meal. 180 tablet 3  . metoprolol tartrate (LOPRESSOR) 25 MG tablet Take 1 tablet (25 mg total) by mouth 2 (two) times daily. 180 tablet 3  . Olopatadine HCl (PATADAY) 0.2 % SOLN Place 1 drop into both eyes daily as needed. 2.5 mL 5  . pantoprazole (PROTONIX) 40 MG tablet Take 1 tablet (40 mg total) by mouth  daily. 90 tablet 3  . Semaglutide,0.25 or 0.5MG/DOS, 2 MG/1.5ML SOPN Inject into the skin. Ozempic    . simvastatin (ZOCOR) 40 MG tablet Take 1 tablet (40 mg total) by mouth daily. 90 tablet 3  . triamcinolone cream (KENALOG) 0.1 % Apply 1 application topically 2 (two) times daily as needed. 45 g 0   No current facility-administered medications for this visit.   Allergies  Allergen Reactions  . Gabapentin     Stomach ache     Review of Systems: All systems reviewed and negative except where noted in HPI.     Physical  Exam:    Wt Readings from Last 3 Encounters:  02/06/20 217 lb 4 oz (98.5 kg)  02/03/20 218 lb 6.4 oz (99.1 kg)  12/17/19 (!) 222 lb (100.7 kg)    BP (!) 148/94   Pulse 77   Ht 5' 5"  (1.651 m)   Wt 217 lb 4 oz (98.5 kg)   BMI 36.15 kg/m  Constitutional:  Pleasant, in no acute distress. Psychiatric: Normal mood and affect. Behavior is normal. EENT: Pupils normal.  Conjunctivae are normal. No scleral icterus. Neck supple. No cervical LAD. Cardiovascular: Normal rate, regular rhythm. No edema Pulmonary/chest: Effort normal and breath sounds normal. No wheezing, rales or rhonchi. Abdominal: Soft, nondistended, nontender. Bowel sounds active throughout. There are no masses palpable. No hepatomegaly. Neurological: Alert and oriented to person place and time. Skin: Skin is warm and dry. No rashes noted.   ASSESSMENT AND PLAN;   1) GERD: - EGD to assess for LES laxity, HH, Erosive esophagitis, etc - Will assess for presence and size of reported HH at time of EGD - Continue current acid suppression therapy, but low threshold to pending suboptimal response and pending EGD findings. Discussed empiric change back to Dexilant, but held off pending EGD results first - Resume antireflux lifestyle/dietary mods  2) Dysphagia: - EGD with dilation as indicated  - If unrevealing, plan for EM, particularly given report of frothiness - ?Sjogren's? Again, pending EGD,  could send serologies - Advised patient to cut food into small pieces, eat small bites, chew food thoroughly and with plenty of liquids to avoid food impaction.  3) Hx of Colon polyps/Polyp surveillance - Colonoscopy  4) Change in bowel habits 5) Diarrhea 6) Hx of IBS-C - Colonoscopy with random and directed bxs - 2-day prep - Discussed possibility of IBS-C with overflow diarrhea, with potential medication adjustment pending colonsocopy findings, to include change in laxatives or addition of Amitiza, Linzess, etc  The indications, risks, and benefits of EGD and colonoscopy were explained to the patient in detail. Risks include but are not limited to bleeding, perforation, adverse reaction to medications, and cardiopulmonary compromise. Sequelae include but are not limited to the possibility of surgery, hositalization, and mortality. The patient verbalized understanding and wished to proceed. All questions answered, referred to scheduler and bowel prep ordered. Further recommendations pending results of the exam.     Lavena Bullion, DO, FACG  02/06/2020, 1:26 PM   Volanda Napoleon Langley Adie, MD

## 2020-02-06 NOTE — Patient Instructions (Addendum)
If you are age 65 or older, your body mass index should be between 23-30. Your Body mass index is 36.15 kg/m. If this is out of the aforementioned range listed, please consider follow up with your Primary Care Provider.  If you are age 8 or younger, your body mass index should be between 19-25. Your Body mass index is 36.15 kg/m. If this is out of the aformentioned range listed, please consider follow up with your Primary Care Provider.   You have been scheduled for an endoscopy and colonoscopy. Please follow the written instructions given to you at your visit today. Please pick up your prep supplies at the pharmacy within the next 1-3 days. If you use inhalers (even only as needed), please bring them with you on the day of your procedure.  We have sent the following medications to your pharmacy for you to pick up at your convenience: Clenpiq  It was a pleasure to see you today!  Vito Cirigliano, D.O.

## 2020-02-09 ENCOUNTER — Ambulatory Visit: Payer: Medicare Other | Admitting: Emergency Medicine

## 2020-02-09 ENCOUNTER — Encounter: Payer: Self-pay | Admitting: Emergency Medicine

## 2020-02-09 ENCOUNTER — Other Ambulatory Visit: Payer: Self-pay

## 2020-02-09 DIAGNOSIS — J3089 Other allergic rhinitis: Secondary | ICD-10-CM | POA: Diagnosis not present

## 2020-02-09 DIAGNOSIS — J4540 Moderate persistent asthma, uncomplicated: Secondary | ICD-10-CM | POA: Diagnosis not present

## 2020-02-09 DIAGNOSIS — K219 Gastro-esophageal reflux disease without esophagitis: Secondary | ICD-10-CM | POA: Insufficient documentation

## 2020-02-09 MED ORDER — DEXILANT 60 MG PO CPDR: 60.0000 mg | DELAYED_RELEASE_CAPSULE | Freq: Every day | ORAL | 5 refills | Status: DC

## 2020-02-09 NOTE — Assessment & Plan Note (Signed)
With daily breakthrough symptoms.  Pepcid was just added to her PPI recently.  She states that she had better control when she was on Dexilant.  We can try changing her pantoprazole back to Dexilant to see if she benefits.  She may need to be on twice daily therapy

## 2020-02-09 NOTE — Addendum Note (Signed)
Addended by: Gavin Potters R on: 02/09/2020 12:16 PM   Modules accepted: Orders

## 2020-02-09 NOTE — Assessment & Plan Note (Signed)
Needs to start taking her Flonase every day, she was recently started on Xyzal and she will continue.  Continue nasal saline rinses.  Question whether her increased allergy symptoms this spring were set off, driven by some reaction to the COVID-19 vaccine.  She can relate her symptoms of rash, congestion, cough to right after she got the vaccine in February.

## 2020-02-09 NOTE — Assessment & Plan Note (Signed)
Longstanding history of asthma, no PFT available, has been mild intermittent depending on exposure profile, not very problematic since she has lived in [place] since 2018.  Significant increase in allergy symptoms and some associated asthma symptoms starting this spring.  Recently started on Symbicort although she has not noticed much clinical benefit.  Question whether her symptoms are principally upper airway, allergy in nature without significant bronchospasm.  I will stop the Symbicort for now, keep albuterol available.  Get full PFT.  Need to try to control her contributing factors which include GERD and chronic rhinitis

## 2020-02-09 NOTE — Progress Notes (Signed)
Subjective:    Patient ID: Terri Gray, female    DOB: 05-May-1956, 64 y.o.   MRN: [id number]  HPI 64 year old never smoker with diabetes, GERD, hypertension, IBS, allergic rhinitis, OSA on CPAP as well as a dental device which she uses occasionally as well.  She carries a history of childhood and then adult asthma that was made in the 80's.  She has had spirometry 02/03/2020 which I reviewed, did not show any evidence or clear obstruction, did suggest restriction.  Full PFT not available. Chest x-ray done on 12/15/2019 reviewed by me, shows no infiltrates. Allergy eval >> trees, grasses  She describes new nasal drainage, and congestion, rash on her arms, legs, back beginning in March. ? Related to getting her COVID vaccines in February. Has difficulty getting air through her nose. She is clearing clear mucous from her chest for spells of says, then sx free for several days. No real wheeze. She is still dealing with the rash - dark patches, sometimes papules. She uses flonase, not every day. Was treated with prednisone in April - slight improvement while on it. Started on Symbicort at that time. Has her albuterol - has only used a few times. She has GERD daily, breakthrough sx on protonix qd. She had better results on dexilant. She was just started on xyzal and pepcid by Dr Verlin Fester.    Review of Systems As per HPI  Past Medical History:  Diagnosis Date  . Allergy   . Arthritis   . Asthma   . Chicken pox   . Colon polyps   . Diabetes mellitus without complication ([place])   . GERD (gastroesophageal reflux disease)   . H/O fracture of nose    Broken by ENT  . History of colon polyps   . Hyperlipidemia   . Hypertension   . IBS (irritable bowel syndrome)   . Migraines   . Obesity   . Shingles 2012  . Sleep apnea    on CPAP machine but uses it 90% of the time because it gives her some complication and does a mouth guard as well  . Thyroid disease      Family History  Problem  Relation Age of Onset  . Diabetes Father   . Kidney disease Father   . Heart failure Father   . Irritable bowel syndrome Father   . Bronchitis Mother   . Ovarian cancer Mother 38  . GER disease Mother   . Colon polyps Mother   . Irritable bowel syndrome Mother   . Diabetes Sister   . Asthma Sister   . Urticaria Sister   . Diabetes Brother   . Diabetes Sister   . Allergic rhinitis Son   . Allergic rhinitis Daughter   . GER disease Daughter   . Irritable bowel syndrome Daughter   . Allergic rhinitis Grandchild   . Food Allergy Grandchild   . Irritable bowel syndrome Daughter   . Immunodeficiency Neg Hx   . Angioedema Neg Hx      Social History   Socioeconomic History  . Marital status: Married    Spouse name: Not on file  . Number of children: 3  . Years of education: 58  . Highest education level: Associate degree: occupational, Hotel manager, or vocational program  Occupational History  . Not on file  Tobacco Use  . Smoking status: Never Smoker  . Smokeless tobacco: Never Used  Vaping Use  . Vaping Use: Never used  Substance and Sexual Activity  .  Alcohol use: No    Comment: last drink was over 30 years ago  . Drug use: No  . Sexual activity: Not on file  Other Topics Concern  . Not on file  Social History Narrative   HH 3    Married   Son lives with them   1 dog   Enjoys reading, tv, computer   Social Determinants of Health   Financial Resource Strain: Low Risk   . Difficulty of Paying Living Expenses: Not very hard  Food Insecurity: No Food Insecurity  . Worried About Charity fundraiser in the Last Year: Never true  . Ran Out of Food in the Last Year: Never true  Transportation Needs: No Transportation Needs  . Lack of Transportation (Medical): No  . Lack of Transportation (Non-Medical): No  Physical Activity: Inactive  . Days of Exercise per Week: 0 days  . Minutes of Exercise per Session: 0 min  Stress: Stress Concern Present  . Feeling of Stress  : To some extent  Social Connections: Moderately Isolated  . Frequency of Communication with Friends and Family: More than three times a week  . Frequency of Social Gatherings with Friends and Family: Twice a week  . Attends Religious Services: Never  . Active Member of Clubs or Organizations: No  . Attends Archivist Meetings: Never  . Marital Status: Married  Human resources officer Violence:   . Fear of Current or Ex-Partner: Not on file  . Emotionally Abused: Not on file  . Physically Abused: Not on file  . Sexually Abused: Not on file    Grew up in [place], has lived in [place], [place], [place]  Allergies  Allergen Reactions  . Gabapentin     Stomach ache     Outpatient Medications Prior to Visit  Medication Sig Dispense Refill  . albuterol (VENTOLIN HFA) 108 (90 Base) MCG/ACT inhaler Inhale 2 puffs into the lungs every 6 (six) hours as needed for wheezing or shortness of breath. 18 g 1  . budesonide-formoterol (SYMBICORT) 160-4.5 MCG/ACT inhaler Inhale 2 puffs into the lungs 2 (two) times daily. 1 Inhaler 3  . cyclobenzaprine (FLEXERIL) 10 MG tablet Take 1 tablet (10 mg total) by mouth 3 (three) times daily as needed for muscle spasms. 90 tablet 3  . famotidine (PEPCID) 20 MG tablet Take 1 tablet (20 mg total) by mouth 2 (two) times daily as needed. 60 tablet 5  . fluticasone (FLONASE) 50 MCG/ACT nasal spray Place 1-2 sprays into both nostrils daily as needed. 16 g 5  . glucose blood (TRUE METRIX BLOOD GLUCOSE TEST) test strip 1 each by Other route 4 (four) times daily as needed for other. And lancets 2/day 360 each 3  . hydrocortisone valerate ointment (WESTCORT) 0.2 % Apply 1 application topically 2 (two) times daily. 60 g 1  . ibuprofen (ADVIL) 800 MG tablet Take 800 mg by mouth every 8 (eight) hours as needed.    Marland Kitchen LANTUS SOLOSTAR 100 UNIT/ML Solostar Pen INJECT 85 UNITS SUBCUTANEOUSLY ONCE DAILY IN THE MORNING 75 mL 0  . levocetirizine (XYZAL) 5 MG tablet May take 1 tablet once or  twice a day as needed (Patient taking differently: Take by mouth as needed. May take 1 tablet once or twice a day as needed) 60 tablet 5  . levothyroxine (SYNTHROID) 75 MCG tablet Take 1 tablet (75 mcg total) by mouth daily before breakfast. 90 tablet 3  . metFORMIN (GLUCOPHAGE) 1000 MG tablet Take 1 tablet (1,000 mg total)  by mouth 2 (two) times daily with a meal. 180 tablet 3  . metoprolol tartrate (LOPRESSOR) 25 MG tablet Take 1 tablet (25 mg total) by mouth 2 (two) times daily. 180 tablet 3  . Olopatadine HCl (PATADAY) 0.2 % SOLN Place 1 drop into both eyes daily as needed. 2.5 mL 5  . pantoprazole (PROTONIX) 40 MG tablet Take 1 tablet (40 mg total) by mouth daily. 90 tablet 3  . Semaglutide,0.25 or 0.5MG/DOS, 2 MG/1.5ML SOPN Inject into the skin. Ozempic    . simvastatin (ZOCOR) 40 MG tablet Take 1 tablet (40 mg total) by mouth daily. 90 tablet 3  . Sod Picosulfate-Mag Ox-Cit Acd (CLENPIQ) 10-3.5-12 MG-GM -GM/160ML SOLN Take 1 kit by mouth as directed. 320 mL 0  . hydrOXYzine (ATARAX/VISTARIL) 25 MG tablet Take 1 tablet (25 mg total) by mouth 3 (three) times daily as needed for itching. (Patient not taking: Reported on 02/09/2020) 30 tablet 1  . triamcinolone cream (KENALOG) 0.1 % Apply 1 application topically 2 (two) times daily as needed. (Patient not taking: Reported on 02/09/2020) 45 g 0   No facility-administered medications prior to visit.        Objective:   Physical Exam Vitals:   02/09/20 1100  BP: (!) 142/72  Pulse: 76  Temp: (!) 97.5 F (36.4 C)  TempSrc: Temporal  SpO2: 97%  Weight: 215 lb (97.5 kg)  Height: _0  (1.676 m)   Gen: Pleasant, overwt woman, in no distress,  normal affect  ENT: No lesions,  mouth clear,  oropharynx clear, nasal congestion and obstruction  Neck: No JVD, mild intermittent insp and exp stridor  Lungs: No use of accessory muscles, no crackles or wheezing on normal respiration, no wheeze on forced expiration  Cardiovascular: RRR, heart  sounds normal, no murmur or gallops, no peripheral edema  Musculoskeletal: No deformities, no cyanosis or clubbing  Neuro: alert, awake, non focal  Skin: Warm, no lesions or rash      Assessment & Plan:  Moderate persistent asthma Longstanding history of asthma, no PFT available, has been mild intermittent depending on exposure profile, not very problematic since she has lived in [place] since 2018.  Significant increase in allergy symptoms and some associated asthma symptoms starting this spring.  Recently started on Symbicort although she has not noticed much clinical benefit.  Question whether her symptoms are principally upper airway, allergy in nature without significant bronchospasm.  I will stop the Symbicort for now, keep albuterol available.  Get full PFT.  Need to try to control her contributing factors which include GERD and chronic rhinitis  Seasonal and perennial allergic rhinitis Needs to start taking her Flonase every day, she was recently started on Xyzal and she will continue.  Continue nasal saline rinses.  Question whether her increased allergy symptoms this spring were set off, driven by some reaction to the COVID-19 vaccine.  She can relate her symptoms of rash, congestion, cough to right after she got the vaccine in February.  GERD (gastroesophageal reflux disease) With daily breakthrough symptoms.  Pepcid was just added to her PPI recently.  She states that she had better control when she was on Dexilant.  We can try changing her pantoprazole back to Dexilant to see if she benefits.  She may need to be on twice daily therapy  Baltazar Apo, MD, PhD 02/09/2020, 11:45 AM [facility] [phone] or if no answer [phone]

## 2020-02-09 NOTE — Patient Instructions (Addendum)
Okay to stop Symbicort for now. Keep your albuterol available use 2 puffs if needed for shortness of breath, chest tightness, wheezing. Please start using your fluticasone nose spray, 2 sprays each nostril once daily. Continue your nasal saline rinses Start taking your Xyzal once every day Stop your Protonix.  We will substitute Dexilant 60 mg once daily.  Take this medication 1 hour around food. Follow with Dr. Lamonte Sakai next available with full pulmonary function testing on the same day.

## 2020-02-10 DIAGNOSIS — L259 Unspecified contact dermatitis, unspecified cause: Secondary | ICD-10-CM | POA: Diagnosis not present

## 2020-02-13 LAB — CBC WITH DIFFERENTIAL/PLATELET
Basophils Absolute: 0.1 10*3/uL (ref 0.0–0.2)
Basos: 1 %
EOS (ABSOLUTE): 0.8 10*3/uL — ABNORMAL HIGH (ref 0.0–0.4)
Eos: 12 %
Hematocrit: 37.6 % (ref 34.0–46.6)
Hemoglobin: 11.7 g/dL (ref 11.1–15.9)
Immature Grans (Abs): 0 10*3/uL (ref 0.0–0.1)
Immature Granulocytes: 0 %
Lymphocytes Absolute: 2.1 10*3/uL (ref 0.7–3.1)
Lymphs: 31 %
MCH: 24.6 pg — ABNORMAL LOW (ref 26.6–33.0)
MCHC: 31.1 g/dL — ABNORMAL LOW (ref 31.5–35.7)
MCV: 79 fL (ref 79–97)
Monocytes Absolute: 0.5 10*3/uL (ref 0.1–0.9)
Monocytes: 8 %
Neutrophils Absolute: 3.3 10*3/uL (ref 1.4–7.0)
Neutrophils: 48 %
Platelets: 374 10*3/uL (ref 150–450)
RBC: 4.76 x10E6/uL (ref 3.77–5.28)
RDW: 15.3 % (ref 11.7–15.4)
WBC: 6.8 10*3/uL (ref 3.4–10.8)

## 2020-02-13 LAB — COMPREHENSIVE METABOLIC PANEL
ALT: 16 IU/L (ref 0–32)
AST: 25 IU/L (ref 0–40)
Albumin/Globulin Ratio: 1.4 (ref 1.2–2.2)
Albumin: 4.1 g/dL (ref 3.8–4.8)
Alkaline Phosphatase: 107 IU/L (ref 44–121)
BUN/Creatinine Ratio: 12 (ref 12–28)
BUN: 9 mg/dL (ref 8–27)
Bilirubin Total: 0.2 mg/dL (ref 0.0–1.2)
CO2: 21 mmol/L (ref 20–29)
Calcium: 9.2 mg/dL (ref 8.7–10.3)
Chloride: 101 mmol/L (ref 96–106)
Creatinine, Ser: 0.78 mg/dL (ref 0.57–1.00)
GFR calc Af Amer: 93 mL/min/{1.73_m2} (ref >59)
GFR calc non Af Amer: 81 mL/min/{1.73_m2} (ref >59)
Globulin, Total: 3 g/dL (ref 1.5–4.5)
Glucose: 169 mg/dL — ABNORMAL HIGH (ref 65–99)
Potassium: 4.6 mmol/L (ref 3.5–5.2)
Sodium: 137 mmol/L (ref 134–144)
Total Protein: 7.1 g/dL (ref 6.0–8.5)

## 2020-02-13 LAB — ALPHA-GAL PANEL
Alpha Gal IgE*: <0.1 kU/L (ref <0.10)
Beef (Bos spp) IgE: <0.1 kU/L (ref <0.35)
Class Interpretation: 0
Class Interpretation: 0
Class Interpretation: 0
Lamb/Mutton (Ovis spp) IgE: <0.1 kU/L (ref <0.35)
Pork (Sus spp) IgE: <0.1 kU/L (ref <0.35)

## 2020-02-13 LAB — THYROGLOBULIN LEVEL: Thyroglobulin (TG-RIA): 2.5 ng/mL

## 2020-02-13 LAB — ANA W/REFLEX IF POSITIVE: Anti Nuclear Antibody (ANA): NEGATIVE

## 2020-02-13 LAB — CHRONIC URTICARIA: cu index: 5.1 (ref <10)

## 2020-02-13 LAB — TRYPTASE: Tryptase: 5.3 ug/L (ref 2.2–13.2)

## 2020-02-25 ENCOUNTER — Encounter: Payer: Self-pay | Admitting: Gastroenterology

## 2020-03-10 ENCOUNTER — Ambulatory Visit (AMBULATORY_SURGERY_CENTER): Payer: Medicare Other | Admitting: Gastroenterology

## 2020-03-10 ENCOUNTER — Encounter: Payer: Self-pay | Admitting: Gastroenterology

## 2020-03-10 ENCOUNTER — Other Ambulatory Visit: Payer: Self-pay

## 2020-03-10 VITALS — BP 132/94 | HR 81 | Temp 97.1°F | Resp 20 | Ht 66.0 in | Wt 215.0 lb

## 2020-03-10 DIAGNOSIS — K3189 Other diseases of stomach and duodenum: Secondary | ICD-10-CM | POA: Diagnosis not present

## 2020-03-10 DIAGNOSIS — R1084 Generalized abdominal pain: Secondary | ICD-10-CM

## 2020-03-10 DIAGNOSIS — R198 Other specified symptoms and signs involving the digestive system and abdomen: Secondary | ICD-10-CM

## 2020-03-10 DIAGNOSIS — R1319 Other dysphagia: Secondary | ICD-10-CM | POA: Diagnosis not present

## 2020-03-10 DIAGNOSIS — K222 Esophageal obstruction: Secondary | ICD-10-CM

## 2020-03-10 DIAGNOSIS — R197 Diarrhea, unspecified: Secondary | ICD-10-CM | POA: Diagnosis not present

## 2020-03-10 DIAGNOSIS — K641 Second degree hemorrhoids: Secondary | ICD-10-CM | POA: Diagnosis not present

## 2020-03-10 DIAGNOSIS — K219 Gastro-esophageal reflux disease without esophagitis: Secondary | ICD-10-CM

## 2020-03-10 DIAGNOSIS — R194 Change in bowel habit: Secondary | ICD-10-CM | POA: Diagnosis not present

## 2020-03-10 DIAGNOSIS — K449 Diaphragmatic hernia without obstruction or gangrene: Secondary | ICD-10-CM | POA: Diagnosis not present

## 2020-03-10 DIAGNOSIS — K2970 Gastritis, unspecified, without bleeding: Secondary | ICD-10-CM | POA: Diagnosis not present

## 2020-03-10 DIAGNOSIS — R1310 Dysphagia, unspecified: Secondary | ICD-10-CM | POA: Diagnosis not present

## 2020-03-10 MED ORDER — SODIUM CHLORIDE 0.9 % IV SOLN: 500.0000 mL | Freq: Once | INTRAVENOUS | Status: DC

## 2020-03-10 NOTE — Patient Instructions (Signed)
YOU HAD AN ENDOSCOPIC PROCEDURE TODAY AT THE [facility]:   Refer to the procedure report that was given to you for any specific questions about what was found during the examination.  If the procedure report does not answer your questions, please call your gastroenterologist to clarify.  If you requested that your care partner not be given the details of your procedure findings, then the procedure report has been included in a sealed envelope for you to review at your convenience later.  YOU SHOULD EXPECT: Some feelings of bloating in the abdomen. Passage of more gas than usual.  Walking can help get rid of the air that was put into your GI tract during the procedure and reduce the bloating. If you had a lower endoscopy (such as a colonoscopy or flexible sigmoidoscopy) you may notice spotting of blood in your stool or on the toilet paper. If you underwent a bowel prep for your procedure, you may not have a normal bowel movement for a few days.  Please Note:  You might notice some irritation and congestion in your nose or some drainage.  This is from the oxygen used during your procedure.  There is no need for concern and it should clear up in a day or so.  SYMPTOMS TO REPORT IMMEDIATELY:   Following lower endoscopy (colonoscopy or flexible sigmoidoscopy):  Excessive amounts of blood in the stool  Significant tenderness or worsening of abdominal pains  Swelling of the abdomen that is new, acute  Fever of 100F or higher   Following upper endoscopy (EGD)  Vomiting of blood or coffee ground material  New chest pain or pain under the shoulder blades  Painful or persistently difficult swallowing  New shortness of breath  Fever of 100F or higher  Black, tarry-looking stools  For urgent or emergent issues, a gastroenterologist can be reached at any hour by calling [phone]. Do not use MyChart messaging for urgent concerns.    DIET:  We do recommend a small meal at first, but  then you may proceed to your regular diet.  Drink plenty of fluids but you should avoid alcoholic beverages for 24 hours.  ACTIVITY:  You should plan to take it easy for the rest of today and you should NOT DRIVE or use heavy machinery until tomorrow (because of the sedation medicines used during the test).    FOLLOW UP: Our staff will call the number listed on your records 48-72 hours following your procedure to check on you and address any questions or concerns that you may have regarding the information given to you following your procedure. If we do not reach you, we will leave a message.  We will attempt to reach you two times.  During this call, we will ask if you have developed any symptoms of COVID 19. If you develop any symptoms (ie: fever, flu-like symptoms, shortness of breath, cough etc.) before then, please call [phone].  If you test positive for Covid 19 in the 2 weeks post procedure, please call and report this information to us.    If any biopsies were taken you will be contacted by phone or by letter within the next 1-3 weeks.  Please call us at [phone] if you have not heard about the biopsies in 3 weeks.    SIGNATURES/CONFIDENTIALITY: You and/or your care partner have signed paperwork which will be entered into your electronic medical record.  These signatures attest to the fact that that the information above on   your After Visit Summary has been reviewed and is understood.  Full responsibility of the confidentiality of this discharge information lies with you and/or your care-partner. 

## 2020-03-10 NOTE — Progress Notes (Signed)
VS by SM.   No changes to medical or social hx since previsit.

## 2020-03-10 NOTE — Op Note (Signed)
[place] Patient Name: Eliah Ozawa Procedure Date: 03/10/2020 7:48 AM MRN: [id number] Endoscopist: Gerrit Heck , MD Age: 64 Referring MD:  Date of Birth: 1955-07-05 Gender: Female Account #: [id number] Procedure:                Upper GI endoscopy Indications:              Generalized abdominal pain, Dysphagia, Suspected                            esophageal reflux, Diarrhea/Change in bowel habits Medicines:                Monitored Anesthesia Care Procedure:                Pre-Anesthesia Assessment:                           - Prior to the procedure, a History and Physical                            was performed, and patient medications and                            allergies were reviewed. The patient's tolerance of                            previous anesthesia was also reviewed. The risks                            and benefits of the procedure and the sedation                            options and risks were discussed with the patient.                            All questions were answered, and informed consent                            was obtained. Prior Anticoagulants: The patient has                            taken no previous anticoagulant or antiplatelet                            agents. ASA Grade Assessment: III - A patient with                            severe systemic disease. After reviewing the risks                            and benefits, the patient was deemed in                            satisfactory condition to undergo the procedure.  After obtaining informed consent, the endoscope was                            passed under direct vision. Throughout the                            procedure, the patient's blood pressure, pulse, and                            oxygen saturations were monitored continuously. The                            Endoscope was introduced through the mouth, and                             advanced to the second part of duodenum. The upper                            GI endoscopy was accomplished without difficulty.                            The patient tolerated the procedure well. Scope In: Scope Out: Findings:                 One benign-appearing, intrinsic mild stenosis was                            found 35 cm from the incisors. This stenosis                            measured less than one cm (in length). The stenosis                            was traversed. A TTS dilator was passed through the                            scope. Dilation with an 18-19-20 mm balloon dilator                            was performed to 20 mm. This was then biopsied with                            a cold forceps for further fracturing of the ring                            (no path specimen). Estimated blood loss was                            minimal.                           A 3 cm hiatal hernia was present. Hill Grade 3  valve noted on retroflexed views.                           The upper third of the esophagus and middle third                            of the esophagus were normal.                           Localized minimal inflammation characterized by                            erythema was found in the gastric antrum. Biopsies                            were taken with a cold forceps for histology.                            Estimated blood loss was minimal.                           The gastric fundus and gastric body were normal.                            Additional mucosal biopsies were taken with a cold                            forceps for Helicobacter pylori testing. Estimated                            blood loss was minimal.                           The examined duodenum was normal. Biopsies for                            histology were taken with a cold forceps for                            evaluation of celiac disease. Estimated blood  loss                            was minimal. Complications:            No immediate complications. Estimated Blood Loss:     Estimated blood loss was minimal. Impression:               - Benign-appearing esophageal stenosis. Dilated                            with 20 mm TTS balloon then fractured with cold                            forceps.                           -  3 cm hiatal hernia.                           - Normal upper third of esophagus and middle third                            of esophagus.                           - Gastritis. Biopsied.                           - Normal gastric fundus and gastric body. Biopsied.                           - Normal examined duodenum. Biopsied. Recommendation:           - Patient has a contact number available for                            emergencies. The signs and symptoms of potential                            delayed complications were discussed with the                            patient. Return to normal activities tomorrow.                            Written discharge instructions were provided to the                            patient.                           - Soft diet today then advance to previous diet                            tomorrow.                           - Continue present medications.                           - Await pathology results.                           - Colonoscopy today. Gerrit Heck, MD 03/10/2020 8:41:51 AM

## 2020-03-10 NOTE — Op Note (Signed)
[place] Patient Name: Terri Gray Procedure Date: 03/10/2020 7:48 AM MRN: [id number] Endoscopist: Gerrit Heck , MD Age: 64 Referring MD:  Date of Birth: 08-01-1955 Gender: Female Account #: [id number] Procedure:                Colonoscopy Indications:              Generalized abdominal pain, Change in bowel habits,                            Diarrhea, History of IBS Medicines:                Monitored Anesthesia Care Procedure:                Pre-Anesthesia Assessment:                           - Prior to the procedure, a History and Physical                            was performed, and patient medications and                            allergies were reviewed. The patient's tolerance of                            previous anesthesia was also reviewed. The risks                            and benefits of the procedure and the sedation                            options and risks were discussed with the patient.                            All questions were answered, and informed consent                            was obtained. Prior Anticoagulants: The patient has                            taken no previous anticoagulant or antiplatelet                            agents. ASA Grade Assessment: III - A patient with                            severe systemic disease. After reviewing the risks                            and benefits, the patient was deemed in                            satisfactory condition to undergo the procedure.  After obtaining informed consent, the colonoscope                            was passed under direct vision. Throughout the                            procedure, the patient's blood pressure, pulse, and                            oxygen saturations were monitored continuously. The                            Colonoscope was introduced through the anus and                            advanced to the the  terminal ileum. The colonoscopy                            was performed without difficulty. The patient                            tolerated the procedure well. The quality of the                            bowel preparation was good. The terminal ileum,                            ileocecal valve, appendiceal orifice, and rectum                            were photographed. Scope In: 8:19:25 AM Scope Out: 8:33:12 AM Scope Withdrawal Time: 0 hours 11 minutes 47 seconds  Total Procedure Duration: 0 hours 13 minutes 47 seconds  Findings:                 Hemorrhoids were found on perianal exam.                           The colon (entire examined portion) appeared                            normal. Biopsies for histology were taken with a                            cold forceps from the right colon and left colon                            for evaluation of microscopic colitis. Estimated                            blood loss was minimal.                           Non-bleeding internal hemorrhoids were found during  retroflexion. The hemorrhoids were small and Grade                            II (internal hemorrhoids that prolapse but reduce                            spontaneously).                           The terminal ileum appeared normal. Complications:            No immediate complications. Estimated Blood Loss:     Estimated blood loss was minimal. Impression:               - Hemorrhoids found on perianal exam.                           - The entire examined colon is normal. Biopsied.                           - Non-bleeding internal hemorrhoids.                           - The examined portion of the ileum was normal. Recommendation:           - Patient has a contact number available for                            emergencies. The signs and symptoms of potential                            delayed complications were discussed with the                             patient. Return to normal activities tomorrow.                            Written discharge instructions were provided to the                            patient.                           - Resume previous diet.                           - Continue present medications.                           - Await pathology results.                           - Repeat colonoscopy in 10 years for screening                            purposes.                           -  Return to GI office PRN.                           - Use fiber, for example Citrucel, Fibercon, Konsyl                            or Metamucil.                           - Internal hemorrhoids were noted on this study and                            may be amenable to hemorrhoid band ligation. If you                            are interested in further treatment of these                            hemorrhoids with band ligation, please contact my                            clinic to set up an appointment for evaluation and                            treatment. Gerrit Heck, MD 03/10/2020 8:45:06 AM

## 2020-03-10 NOTE — Progress Notes (Signed)
A/ox3, pleased with MAC, report to RN 

## 2020-03-10 NOTE — Progress Notes (Signed)
Called to room to assist during endoscopic procedure.  Patient ID and intended procedure confirmed with present staff. Received instructions for my participation in the procedure from the performing physician.  

## 2020-03-12 ENCOUNTER — Telehealth: Payer: Self-pay

## 2020-03-12 NOTE — Telephone Encounter (Signed)
°  Follow up Call-  Call back number 03/10/2020  Post procedure Call Back phone  # [phone]  Permission to leave phone message Yes  Some recent data might be hidden     Patient questions:  Do you have a fever, pain , or abdominal swelling? No. Pain Score  0 *  Have you tolerated food without any problems? Yes.    Have you been able to return to your normal activities? Yes.    Do you have any questions about your discharge instructions: Diet   No. Medications  No. Follow up visit  No.  Do you have questions or concerns about your Care? No.  Actions: * If pain score is 4 or above: No action needed, pain <4.  1. Have you developed a fever since your procedure? no  2.   Have you had an respiratory symptoms (SOB or cough) since your procedure? no  3.   Have you tested positive for COVID 19 since your procedure no  4.   Have you had any family members/close contacts diagnosed with the COVID 19 since your procedure?  no   If yes to any of these questions please route to Joylene John, RN and Joella Prince, RN

## 2020-03-18 ENCOUNTER — Encounter: Payer: Self-pay | Admitting: Gastroenterology

## 2020-03-22 ENCOUNTER — Ambulatory Visit: Payer: Medicare Other | Admitting: Emergency Medicine

## 2020-03-25 LAB — HM DIABETES EYE EXAM

## 2020-03-29 DIAGNOSIS — L239 Allergic contact dermatitis, unspecified cause: Secondary | ICD-10-CM | POA: Diagnosis not present

## 2020-04-02 DIAGNOSIS — L239 Allergic contact dermatitis, unspecified cause: Secondary | ICD-10-CM | POA: Diagnosis not present

## 2020-05-12 ENCOUNTER — Encounter: Payer: Self-pay | Admitting: Gastroenterology

## 2020-05-12 ENCOUNTER — Ambulatory Visit: Payer: Medicare Other | Admitting: Gastroenterology

## 2020-05-12 ENCOUNTER — Other Ambulatory Visit (INDEPENDENT_AMBULATORY_CARE_PROVIDER_SITE_OTHER): Payer: Medicare Other

## 2020-05-12 ENCOUNTER — Other Ambulatory Visit: Payer: Self-pay

## 2020-05-12 VITALS — BP 138/84 | Ht 66.0 in | Wt 211.0 lb

## 2020-05-12 DIAGNOSIS — R140 Abdominal distension (gaseous): Secondary | ICD-10-CM | POA: Diagnosis not present

## 2020-05-12 DIAGNOSIS — K117 Disturbances of salivary secretion: Secondary | ICD-10-CM

## 2020-05-12 DIAGNOSIS — K641 Second degree hemorrhoids: Secondary | ICD-10-CM | POA: Diagnosis not present

## 2020-05-12 DIAGNOSIS — K219 Gastro-esophageal reflux disease without esophagitis: Secondary | ICD-10-CM

## 2020-05-12 DIAGNOSIS — R1310 Dysphagia, unspecified: Secondary | ICD-10-CM

## 2020-05-12 LAB — SEDIMENTATION RATE: Sed Rate: 32 mm/hr — ABNORMAL HIGH (ref 0–30)

## 2020-05-12 MED ORDER — ESOMEPRAZOLE MAGNESIUM 40 MG PO CPDR: DELAYED_RELEASE_CAPSULE | ORAL | 1 refills | Status: DC

## 2020-05-12 NOTE — Progress Notes (Signed)
P  Chief Complaint:    GERD, dysphagia, hemorrhoids  GI History: 64 y.o. female with a history of diabetes, HTN, HLD, hypothyroidism, GERD, migraines, OSA (on CPAP),  initially referred to the Gastroenterology Clinic in 01/2020 for evaluation of GERD along with evaluation of abdominal pain, change in bowel habits.  1) GERD: Longstanding history of reflux with index symptoms of HB, regurgitaiton, sour brash. Worse with greasy, spicy foods. Usually within 1 hour of eating. Previously treated with Dexilant, which worked well, but cost prohibitive and changed to Protonix 40 mg/day a few years ago with suboptimal response. Will occasional take a 2nd dose. Recently started on Pepcid 20 mg/day, but more so for rash (was recently seen by [facility]).  -EGD (02/2020)   2) Change in bowel habits: Increased flatus and BM alternates between diarrhea and constipation. Previously diagnosed with IBS-C, but the diarrhea is new. No hematochezia or melena. Can have associated generalized abd pain, cramping, and bloating. Has trialed Gas-X, Bean-O, stool softeners, and anti-diarrheals with minimal improvement.  3) Xerostomia, dysphagia:Dry mouth started and occasional frothy, "chalky" mouth. Worse with dairy and fruits.  Also with intermittent dysphagia and choking sensation. Dysphagia can be solids or liquids, pointing to anterior neck -11/2019: CRP normal, normal WBC but peripheral eosinophilia, H/H 9.6/37.2 -01/2020: ANA negative, ESR 43 -EGD with dilation (02/2020) -Referral for Esophageal Manometry today  Previously followed with a Gastroenterologist in [place].  Reports previously being diagnosed with IBS along with GERD with hiatal hernia.  EGD/colonoscopy completed around 2014.  Has a history of colon polyps on last colonoscopy, but unknown size, location, histology, etc, and doesn't know when was told to repeat. No reports available for review.  Endoscopic History: -EGD (02/2020): Mild stenosis  dilated with 20 mm TTS then fractured with forceps, 3 cm HH, minimal gastritis, normal duodenum (normal biopsies) -Colonoscopy (02/2020): Normal (biopsies negative for MC), normal TI.  Internal hemorrhoids.  Repeat 10 years      HPI:     Patient is a 64 y.o. female presenting to the Gastroenterology Clinic for follow-up.   No change in xerostomia or dysphagia since EGD with TTS dilation.  Continues to have abdominal bloating and regurgitation.   Also with sympotomatic hemorrhoids, described as irritation, burning. No hematochezia, but occasional BRB on tissue paper.   Today, reports she has had dry eyes for years, using drops for years.    Review of systems:     No chest pain, no SOB, no fevers, no urinary sx   Past Medical History:  Diagnosis Date  . Allergy   . Arthritis   . Asthma   . Chicken pox   . Colon polyps   . Diabetes mellitus without complication ([place])   . GERD (gastroesophageal reflux disease)   . H/O fracture of nose    Broken by ENT  . History of colon polyps   . Hyperlipidemia   . Hypertension   . IBS (irritable bowel syndrome)   . Migraines   . Obesity   . Shingles 2012  . Sleep apnea    on CPAP machine but uses it 90% of the time because it gives her some complication and does a mouth guard as well  . Thyroid disease     Patient's surgical history, family medical history, social history, medications and allergies were all reviewed in Epic    Current Outpatient Medications  Medication Sig Dispense Refill  . albuterol (VENTOLIN HFA) 108 (90 Base) MCG/ACT inhaler Inhale 2 puffs into  the lungs every 6 (six) hours as needed for wheezing or shortness of breath. 18 g 1  . cyclobenzaprine (FLEXERIL) 10 MG tablet Take 1 tablet (10 mg total) by mouth 3 (three) times daily as needed for muscle spasms. 90 tablet 3  . famotidine (PEPCID) 20 MG tablet Take 1 tablet (20 mg total) by mouth 2 (two) times daily as needed. 60 tablet 5  . fluticasone (FLONASE) 50  MCG/ACT nasal spray Place 1-2 sprays into both nostrils daily as needed. 16 g 5  . glucose blood (TRUE METRIX BLOOD GLUCOSE TEST) test strip 1 each by Other route 4 (four) times daily as needed for other. And lancets 2/day 360 each 3  . hydrocortisone valerate ointment (WESTCORT) 0.2 % Apply 1 application topically 2 (two) times daily. 60 g 1  . hydrOXYzine (ATARAX/VISTARIL) 25 MG tablet Take 1 tablet (25 mg total) by mouth 3 (three) times daily as needed for itching. 30 tablet 1  . ibuprofen (ADVIL) 800 MG tablet Take 800 mg by mouth every 8 (eight) hours as needed.    Marland Kitchen LANTUS SOLOSTAR 100 UNIT/ML Solostar Pen INJECT 85 UNITS SUBCUTANEOUSLY ONCE DAILY IN THE MORNING 75 mL 0  . levocetirizine (XYZAL) 5 MG tablet May take 1 tablet once or twice a day as needed 60 tablet 5  . levothyroxine (SYNTHROID) 75 MCG tablet Take 1 tablet (75 mcg total) by mouth daily before breakfast. 90 tablet 3  . metFORMIN (GLUCOPHAGE) 1000 MG tablet Take 1 tablet (1,000 mg total) by mouth 2 (two) times daily with a meal. 180 tablet 3  . metoprolol tartrate (LOPRESSOR) 25 MG tablet Take 1 tablet (25 mg total) by mouth 2 (two) times daily. 180 tablet 3  . Olopatadine HCl (PATADAY) 0.2 % SOLN Place 1 drop into both eyes daily as needed. 2.5 mL 5  . simvastatin (ZOCOR) 40 MG tablet Take 1 tablet (40 mg total) by mouth daily. 90 tablet 3  . Sod Picosulfate-Mag Ox-Cit Acd (CLENPIQ) 10-3.5-12 MG-GM -GM/160ML SOLN Take 1 kit by mouth as directed. 320 mL 0  . triamcinolone cream (KENALOG) 0.1 % Apply 1 application topically 2 (two) times daily as needed. 45 g 0   No current facility-administered medications for this visit.    Physical Exam:     BP 138/84 (BP Location: Right Arm, Patient Position: Sitting, Cuff Size: Normal)   Ht 5' 6"  (1.676 m)   Wt 211 lb (95.7 kg)   BMI 34.06 kg/m   GENERAL:  Pleasant female in NAD PSYCH: : Cooperative, normal affect Musculoskeletal:  Normal muscle tone, normal strength NEURO: Alert  and oriented x 3, no focal neurologic deficits   IMPRESSION and PLAN:    1) Dysphagia 2) Xerostomia  - SSA, SSB - EM, pH/Mii (off PPI x5 days) - UGI series to evaluate for delayed small intestine motility -ESR mildly elevated in 01/2020 (4 3).  Repeat today  3) GERD -Start Nexium 40 mg BID x4 weeks, then daily. -Stop Protonix.  Dexilant cost prohibitive -Continue Pepcid (was prescribed by Allergy) -Continue antireflux lifestyle/dietary modifications -We will follow up on pH/impedance as ordered above  3) Abdominal bloating -UGI series and evaluation as above -Conservative measures for now with additional recommendations pending the above studies  4) Symptomatic hemorrhoids (grade 2) -Conservative management -if no response to appropriate conservative management, plan for hemorrhoid banding  I spent 35 minutes of time, including in depth chart review, independent review of results as outlined above, communicating results with the patient directly, face-to-face  time with the patient, coordinating care, and ordering studies and medications as appropriate, and documentation.            Lavena Bullion ,DO, FACG 05/12/2020, 9:52 AM

## 2020-05-12 NOTE — Patient Instructions (Signed)
If you are age 65 or older, your body mass index should be between 23-30. Your Body mass index is 34.06 kg/m. If this is out of the aforementioned range listed, please consider follow up with your Primary Care Provider.  If you are age 62 or younger, your body mass index should be between 19-25. Your Body mass index is 34.06 kg/m. If this is out of the aformentioned range listed, please consider follow up with your Primary Care Provider.    You have been scheduled for an Upper GI Series and Small Bowel Follow Thru at [facility]. Your appointment is on 05/20/2020 at 10:30am. Please arrive 15 minutes prior to your test for registration. Make certain not to have anything to eat or drink after midnight on the night before your test. If you need to reschedule, please contact radiology at [phone]. --------------------------------------------------------------------------------------------------------------- An upper GI series uses x rays to help diagnose problems of the upper GI tract, which includes the esophagus, stomach, and duodenum. The duodenum is the first part of the small intestine. An upper GI series is conducted by a radiology technologist or a radiologist--a doctor who specializes in x-ray imaging--at a hospital or outpatient center. While sitting or standing in front of an x-ray machine, the patient drinks barium liquid, which is often white and has a chalky consistency and taste. The barium liquid coats the lining of the upper GI tract and makes signs of disease show up more clearly on x rays. X-ray video, called fluoroscopy, is used to view the barium liquid moving through the esophagus, stomach, and duodenum. Additional x rays and fluoroscopy are performed while the patient lies on an x-ray table. To fully coat the upper GI tract with barium liquid, the technologist or radiologist may press on the abdomen or ask the patient to change position. Patients hold still in various  positions, allowing the technologist or radiologist to take x rays of the upper GI tract at different angles. If a technologist conducts the upper GI series, a radiologist will later examine the images to look for problems.  This test typically takes about 1 hour to complete --------------------------------------------------------------------------------------------------------------------------------------------- The Small Bowel Follow Thru examination is used to visualize the entire small bowel (intestines); specifically the connection between the small and large intestine. You will be positioned on a flat x-ray table and an image of your abdomen taken. Then the technologist will show the x-ray to the radiologist. The radiologist will instruct your technologist how much (1-2 cups) barium sulfate you will drink and when to begin taking the timed x-rays, usually 15-30 minutes after you begin drinking. Barium is a harmless substance that will highlight your small intestine by absorbing x-ray. The taste is chalky and it feels very heavy both in the cup and in your stomach.  After the first x-ray is taken and shown to the radiologist, he/she will determine when the next image is to be taken. This is repeated until the barium has reached the end of the small intestine and enters the beginning of the colon (cecum). At such time when the barium spills into the colon, you will be positioned on the x-ray table once again. The radiologist will use a fluoroscopic camera to take some detailed pictures of the connection between your small intestine and colon. The fluoroscope is an x-ray unit that works with a television/computer screen. The radiologist will apply pressure to your abdomen with his/her hand and a lead glove, a plastic paddle, or a paddle with an inflated  rubber balloon on the end. This is to spread apart your loops of intestine so he/she can see all areas.   This test typically takes around 1 hour to  complete.  Important Drink plenty of water (8-10 cups/day) for a few days following the procedure to avoid constipation and blockage. The barium will make your stools white for a few days. ------------------------------------------------------------------------------------------------- We have sent the following medications to your pharmacy for you to pick up at your convenience:  nexium 71m take 2 tablets daily before meals for 4 weeks then decrease to 1 tablet daily.  We will contact you with a time for the Esophageal Manometry and Covid testing. [facility]  Please [facility] the second [facility] of [facility]. [facility]  Due to recent changes in healthcare laws, you may see the results of your imaging and laboratory studies on MyChart before your provider has had a chance to review them.  We understand that in some cases there may be results that are confusing or concerning to you. Not all laboratory results come back in the same time frame and the provider may be waiting for multiple results in order to interpret others.  Please give [id number] hours in order for your provider to thoroughly review all the results before contacting the office for clarification of your results.   Thank you for choosing me and [facility].  Vito Cirigliano, D.O.

## 2020-05-13 LAB — SJOGRENS SYNDROME-A EXTRACTABLE NUCLEAR ANTIBODY: SSA (Ro) (ENA) Antibody, IgG: <1 AI

## 2020-05-13 LAB — SJOGRENS SYNDROME-B EXTRACTABLE NUCLEAR ANTIBODY: SSB (La) (ENA) Antibody, IgG: <1 AI

## 2020-05-19 DIAGNOSIS — Z794 Long term (current) use of insulin: Secondary | ICD-10-CM | POA: Diagnosis not present

## 2020-05-19 DIAGNOSIS — E782 Mixed hyperlipidemia: Secondary | ICD-10-CM | POA: Diagnosis not present

## 2020-05-19 DIAGNOSIS — E1142 Type 2 diabetes mellitus with diabetic polyneuropathy: Secondary | ICD-10-CM | POA: Diagnosis not present

## 2020-05-19 DIAGNOSIS — I10 Essential (primary) hypertension: Secondary | ICD-10-CM | POA: Diagnosis not present

## 2020-05-19 DIAGNOSIS — E039 Hypothyroidism, unspecified: Secondary | ICD-10-CM | POA: Diagnosis not present

## 2020-05-20 ENCOUNTER — Ambulatory Visit (HOSPITAL_COMMUNITY)
Admission: RE | Admit: 2020-05-20 | Discharge: 2020-05-20 | Disposition: A | Discharge status: Home / Self Care | Payer: Medicare Other | Source: Ambulatory Visit | Attending: Gastroenterology | Admitting: Gastroenterology

## 2020-05-20 ENCOUNTER — Other Ambulatory Visit: Payer: Self-pay

## 2020-05-20 DIAGNOSIS — R1310 Dysphagia, unspecified: Secondary | ICD-10-CM | POA: Diagnosis not present

## 2020-05-20 DIAGNOSIS — K219 Gastro-esophageal reflux disease without esophagitis: Secondary | ICD-10-CM | POA: Diagnosis not present

## 2020-05-20 DIAGNOSIS — K117 Disturbances of salivary secretion: Secondary | ICD-10-CM | POA: Insufficient documentation

## 2020-05-31 ENCOUNTER — Telehealth: Payer: Self-pay | Admitting: Gastroenterology

## 2020-05-31 NOTE — Telephone Encounter (Signed)
Inbound call from patient wanting to cancel procedure scheduled for Jun 22, 2020.  States there was a death in the family and she will have to go out of state.  She will call back to reschedule.

## 2020-05-31 NOTE — Progress Notes (Signed)
Call received from patient wanting to cancel her procedure on 06/07/2020 at 12:30 due to a family death that she needs to go out of town for. Explained to patient in order to be rescheduled she would need to call Dr. Vivia Ewing  Office. She verbalizes understanding and states she would call to reschedule. Will take her off the schedule.

## 2020-06-03 ENCOUNTER — Other Ambulatory Visit (HOSPITAL_COMMUNITY): Payer: Medicare Other

## 2020-06-07 ENCOUNTER — Encounter (HOSPITAL_COMMUNITY): Payer: Self-pay

## 2020-06-07 ENCOUNTER — Ambulatory Visit (HOSPITAL_COMMUNITY): Admit: 2020-06-07 | Payer: Medicare Other | Admitting: Gastroenterology

## 2020-06-07 SURGERY — MANOMETRY, ESOPHAGUS

## 2020-07-23 NOTE — Progress Notes (Signed)
Subjective:   Terri Gray is a 65 y.o. female who presents for Medicare Annual (Subsequent) preventive examination.  I connected with Terri Gray today by telephone and verified that I am speaking with the correct person using two identifiers. Location patient: home Location provider: work Persons participating in the virtual visit: patient, provider.   I discussed the limitations, risks, security and privacy concerns of performing an evaluation and management service by telephone and the availability of in person appointments. I also discussed with the patient that there may be a patient responsible charge related to this service. The patient expressed understanding and verbally consented to this telephonic visit.    Interactive audio and video telecommunications were attempted between this provider and patient, however failed, due to patient having technical difficulties OR patient did not have access to video capability.  We continued and completed visit with audio only.      Review of Systems    N/A  Cardiac Risk Factors include: advanced age (>13mn, >>73women);diabetes mellitus;dyslipidemia     Objective:    Today's Vitals   There is no height or weight on file to calculate BMI.  Advanced Directives 07/27/2020 07/10/2019  Does Patient Have a Medical Advance Directive? Yes Yes  Type of AParamed[place]of [place] will [place] will  Does patient want to make changes to medical advance directive? No - Patient declined No - Patient declined  Copy of [place] Chart? No - copy requested No - copy requested    Current Medications (verified) Outpatient Encounter Medications as of 07/27/2020  Medication Sig  . albuterol (VENTOLIN HFA) 108 (90 Base) MCG/ACT inhaler Inhale 2 puffs into the lungs every 6 (six) hours as needed for wheezing or shortness of breath.  . cyclobenzaprine (FLEXERIL) 10 MG  tablet Take 1 tablet (10 mg total) by mouth 3 (three) times daily as needed for muscle spasms.  .Marland Kitchenesomeprazole (NEXIUM) 40 MG capsule Take 40 mg twice a day for 4 weeks. Then 1 daily thereafter.  . fluticasone (FLONASE) 50 MCG/ACT nasal spray Place 1-2 sprays into both nostrils daily as needed.  .Marland Kitchenglucose blood (TRUE METRIX BLOOD GLUCOSE TEST) test strip 1 each by Other route 4 (four) times daily as needed for other. And lancets 2/day  . hydrocortisone 2.5 % ointment Apply topically.  .Marland Kitchenibuprofen (ADVIL) 800 MG tablet Take 800 mg by mouth every 8 (eight) hours as needed.  .Marland KitchenLANTUS SOLOSTAR 100 UNIT/ML Solostar Pen INJECT 85 UNITS SUBCUTANEOUSLY ONCE DAILY IN THE MORNING  . levocetirizine (XYZAL) 5 MG tablet May take 1 tablet once or twice a day as needed  . levothyroxine (SYNTHROID) 75 MCG tablet Take 1 tablet (75 mcg total) by mouth daily before breakfast.  . metFORMIN (GLUCOPHAGE) 1000 MG tablet Take 1 tablet (1,000 mg total) by mouth 2 (two) times daily with a meal.  . metoprolol tartrate (LOPRESSOR) 25 MG tablet Take 1 tablet (25 mg total) by mouth 2 (two) times daily.  . Olopatadine HCl (PATADAY) 0.2 % SOLN Place 1 drop into both eyes daily as needed.  . Semaglutide,0.25 or 0.5MG/DOS, 2 MG/1.5ML SOPN Inject into the skin.  .Marland Kitchensimvastatin (ZOCOR) 40 MG tablet Take 1 tablet (40 mg total) by mouth daily.  . famotidine (PEPCID) 20 MG tablet Take 1 tablet (20 mg total) by mouth 2 (two) times daily as needed. (Patient not taking: Reported on 07/27/2020)  . hydrocortisone valerate ointment (WESTCORT) 0.2 % Apply 1 application  topically 2 (two) times daily.  . [DISCONTINUED] hydrOXYzine (ATARAX/VISTARIL) 25 MG tablet Take 1 tablet (25 mg total) by mouth 3 (three) times daily as needed for itching.  . [DISCONTINUED] pantoprazole (PROTONIX) 40 MG tablet Take 40 mg by mouth daily.  . [DISCONTINUED] Sod Picosulfate-Mag Ox-Cit Acd (CLENPIQ) 10-3.5-12 MG-GM -GM/160ML SOLN Take 1 kit by mouth as directed.  (Patient not taking: Reported on 07/27/2020)  . [DISCONTINUED] tacrolimus (PROTOPIC) 0.1 % ointment Apply 1 application topically 2 (two) times daily. (Patient not taking: Reported on 07/27/2020)  . [DISCONTINUED] triamcinolone cream (KENALOG) 0.1 % Apply 1 application topically 2 (two) times daily as needed. (Patient not taking: Reported on 07/27/2020)  . [DISCONTINUED] triamcinolone ointment (KENALOG) 0.1 % APPLY OINTMENT TOPICALLY TO AFFECTED AREA TWICE DAILY AS NEEDED FOR FLARES FOR 2 WEEKS ON, THEN 2 WEEKS OFF. AVOID FACE, GROIN, AND UNDERARMS   No facility-administered encounter medications on file as of 07/27/2020.    Allergies (verified) Gabapentin   History: Past Medical History:  Diagnosis Date  . Allergy   . Arthritis   . Asthma   . Chicken pox   . Colon polyps   . Diabetes mellitus without complication ([place])   . GERD (gastroesophageal reflux disease)   . H/O fracture of nose    Broken by ENT  . History of colon polyps   . Hyperlipidemia   . Hypertension   . IBS (irritable bowel syndrome)   . Migraines   . Obesity   . Shingles 2012  . Sleep apnea    on CPAP machine but uses it 90% of the time because it gives her some complication and does a mouth guard as well  . Thyroid disease    Past Surgical History:  Procedure Laterality Date  . COLONOSCOPY     Around 2014- 2015 in [place]  . ESOPHAGOGASTRODUODENOSCOPY     Around 2014-2015 [place]  . REPLACEMENT TOTAL KNEE Left 2016   Family History  Problem Relation Age of Onset  . Diabetes Father   . Kidney disease Father   . Heart failure Father   . Irritable bowel syndrome Father   . Bronchitis Mother   . Ovarian cancer Mother 71  . GER disease Mother   . Colon polyps Mother   . Irritable bowel syndrome Mother   . Diabetes Sister   . Asthma Sister   . Urticaria Sister   . Diabetes Brother   . Diabetes Sister   . Allergic rhinitis Son   . Allergic rhinitis Daughter   . GER disease Daughter    . Irritable bowel syndrome Daughter   . Allergic rhinitis Grandchild   . Food Allergy Grandchild   . Irritable bowel syndrome Daughter   . Immunodeficiency Neg Hx   . Angioedema Neg Hx    Social History   Socioeconomic History  . Marital status: Married    Spouse name: Not on file  . Number of children: 3  . Years of education: 79  . Highest education level: Associate degree: occupational, Hotel manager, or vocational program  Occupational History  . Not on file  Tobacco Use  . Smoking status: Never Smoker  . Smokeless tobacco: Never Used  Vaping Use  . Vaping Use: Never used  Substance and Sexual Activity  . Alcohol use: No    Comment: last drink was over 30 years ago  . Drug use: No  . Sexual activity: Not on file  Other Topics Concern  . Not on file  Social History  Narrative   HH 3    Married   Son lives with them   1 dog   Enjoys reading, tv, Teaching laboratory technician   Social Determinants of Health   Financial Resource Strain: Low Risk   . Difficulty of Paying Living Expenses: Not hard at all  Food Insecurity: No Food Insecurity  . Worried About Charity fundraiser in the Last Year: Never true  . Ran Out of Food in the Last Year: Never true  Transportation Needs: No Transportation Needs  . Lack of Transportation (Medical): No  . Lack of Transportation (Non-Medical): No  Physical Activity: Sufficiently Active  . Days of Exercise per Week: 7 days  . Minutes of Exercise per Session: 30 min  Stress: No Stress Concern Present  . Feeling of Stress : Not at all  Social Connections: Moderately Isolated  . Frequency of Communication with Friends and Family: More than three times a week  . Frequency of Social Gatherings with Friends and Family: Three times a week  . Attends Religious Services: Never  . Active Member of Clubs or Organizations: No  . Attends Archivist Meetings: Never  . Marital Status: Married    Tobacco Counseling Counseling given: Not  Answered   Clinical Intake:  Pre-visit preparation completed: Yes  Pain : No/denies pain     Nutritional Risks: None Diabetes: Yes CBG done?: No Did pt. bring in CBG monitor from home?: No  How often do you need to have someone help you when you read instructions, pamphlets, or other written materials from your doctor or pharmacy?: 1 - Never What is the last grade level you completed in school?: College  Diabetic?Yes Nutrition Risk Assessment:  Has the patient had any N/V/D within the last 2 months?  No  Does the patient have any non-healing wounds?  No  Has the patient had any unintentional weight loss or weight gain?  No   Diabetes:  Is the patient diabetic?  Yes  If diabetic, was a CBG obtained today?  No  Did the patient bring in their glucometer from home?  No  How often do you monitor your CBG's? Patient states checks glucose twice. States glucose usually runs 90-105.   Financial Strains and Diabetes Management:  Are you having any financial strains with the device, your supplies or your medication? No .  Does the patient want to be seen by Chronic Care Management for management of their diabetes?  No  Would the patient like to be referred to a Nutritionist or for Diabetic Management?  No   Diabetic Exams:  Diabetic Eye Exam: Completed 03/25/2020 Diabetic Foot Exam: Overdue, Pt has been advised about the importance in completing this exam. Pt is scheduled for diabetic foot exam on July 2022.   Interpreter Needed?: No  Information entered by :: [place] of Daily Living In your present state of health, do you have any difficulty performing the following activities: 07/27/2020  Hearing? N  Comment has some ringing in ears  Vision? N  Difficulty concentrating or making decisions? N  Walking or climbing stairs? N  Dressing or bathing? N  Doing errands, shopping? N  Preparing Food and eating ? N  Using the Toilet? N  In the past six months,  have you accidently leaked urine? N  Do you have problems with loss of bowel control? N  Managing your Medications? N  Managing your Finances? N  Housekeeping or managing your Housekeeping? N  Some recent data  might be hidden    Patient Care Team: [name], MD as PCP - General (Family Medicine)  Indicate any recent Medical Services you may have received from other than [facility] providers in the past year (date may be approximate).     Assessment:   This is a routine wellness examination for Ayeshia.  Hearing/Vision screen  Hearing Screening   125Hz  250Hz  500Hz  1000Hz  2000Hz  3000Hz  4000Hz  6000Hz  8000Hz   Right ear:           Left ear:           Vision Screening Comments: Gets eyes examined once per year. Had last eye exam in January. Wears glasses    Dietary issues and exercise activities discussed: Current Exercise Habits: Home exercise routine, Type of exercise: walking, Time (Minutes): 30, Frequency (Times/Week): 7, Weekly Exercise (Minutes/Week): 210, Intensity: Mild  Goals    . improve pain in foot and ankle    . remain covid free      Depression Screen PHQ 2/9 Scores 07/27/2020 07/10/2019 02/28/2017  PHQ - 2 Score 0 0 0    Fall Risk Fall Risk  07/27/2020 07/10/2019  Falls in the past year? 0 -  Number falls in past yr: 0 0  Injury with Fall? 0 0  Risk for fall due to : No Fall Risks Medication side effect  Risk for fall due to: Comment - tripped over dog and stairs  Follow up - Falls evaluation completed;Education provided;Falls prevention discussed    FALL RISK PREVENTION PERTAINING TO THE HOME:  Any stairs in or around the home? Yes  If so, are there any without handrails? No  Home free of loose throw rugs in walkways, pet beds, electrical cords, etc? Yes  Adequate lighting in your home to reduce risk of falls? Yes   ASSISTIVE DEVICES UTILIZED TO PREVENT FALLS:  Life alert? No  Use of a cane, walker or w/c? No  Grab bars in the bathroom? Yes  Shower  chair or bench in shower? No  Elevated toilet seat or a handicapped toilet? No    Cognitive Function: Normal cognitive status assessed by direct observation by this Nurse Health Advisor. No abnormalities found.       6CIT Screen 07/10/2019  What Year? 0 points  What month? 0 points  What time? 0 points  Count back from 20 0 points  Months in reverse 0 points  Repeat phrase 0 points  Total Score 0    Immunizations Immunization History  Administered Date(s) Administered  . Fluad Quad(high Dose 65+) 02/18/2019  . Influenza,inj,Quad PF,6+ Mos 02/20/2018  . Influenza-Unspecified 06/10/2020  . PFIZER(Purple Top)SARS-COV-2 Vaccination 06/26/2019, 07/17/2019  . Pneumococcal Conjugate-13 03/01/2018  . Zoster Recombinat (Shingrix) 10/28/2019    TDAP status: Due, Education has been provided regarding the importance of this vaccine. Advised may receive this vaccine at local pharmacy or Health Dept. Aware to provide a copy of the vaccination record if obtained from local pharmacy or Health Dept. Verbalized acceptance and understanding.  Flu Vaccine status: Up to date  Pneumococcal vaccine status: Due, Education has been provided regarding the importance of this vaccine. Advised may receive this vaccine at local pharmacy or Health Dept. Aware to provide a copy of the vaccination record if obtained from local pharmacy or Health Dept. Verbalized acceptance and understanding.  Covid-19 vaccine status: Completed vaccines  Qualifies for Shingles Vaccine? Yes   Zostavax completed No   Shingrix Completed?: Yes  Screening Tests Health Maintenance  Topic Date Due  .  URINE MICROALBUMIN  Never done  . HIV Screening  Never done  . TETANUS/TDAP  Never done  . PAP SMEAR-Modifier  Never done  . MAMMOGRAM  Never done  . FOOT EXAM  07/17/2019  . COVID-19 Vaccine (3 - Booster for Pfizer series) 01/14/2020  . DEXA SCAN  Never done  . PNA vac Low Risk Adult (2 of 2 - PPSV23) 03/03/2020  .  HEMOGLOBIN A1C  06/04/2020  . OPHTHALMOLOGY EXAM  03/25/2021  . COLONOSCOPY (Pts 45-27yr Insurance coverage will need to be confirmed)  03/10/2030  . INFLUENZA VACCINE  Completed  . Hepatitis C Screening  Completed  . HPV VACCINES  Aged Out    Health Maintenance  Health Maintenance Due  Topic Date Due  . URINE MICROALBUMIN  Never done  . HIV Screening  Never done  . TETANUS/TDAP  Never done  . PAP SMEAR-Modifier  Never done  . MAMMOGRAM  Never done  . FOOT EXAM  07/17/2019  . COVID-19 Vaccine (3 - Booster for Pfizer series) 01/14/2020  . DEXA SCAN  Never done  . PNA vac Low Risk Adult (2 of 2 - PPSV23) 03/03/2020  . HEMOGLOBIN A1C  06/04/2020    Colorectal cancer screening: Type of screening: Colonoscopy. Completed 03/10/2020. Repeat every 10 years  Mammogram status: Ordered 07/27/2020. Pt provided with contact info and advised to call to schedule appt.   Bone Density status: Ordered 07/27/2020. Pt provided with contact info and advised to call to schedule appt.  Lung Cancer Screening: (Low Dose CT Chest recommended if Age 66-80years, 30 pack-year currently smoking OR have quit w/in 15years.) does not qualify.   Lung Cancer Screening Referral: N/A   Additional Screening:  Hepatitis C Screening: does qualify; Completed 12/03/2019  Vision Screening: Recommended annual ophthalmology exams for early detection of glaucoma and other disorders of the eye. Is the patient up to date with their annual eye exam?  Yes  Who is the provider or what is the name of the office in which the patient attends annual eye exams? Dr. DBing Plume If pt is not established with a provider, would they like to be referred to a provider to establish care? No .   Dental Screening: Recommended annual dental exams for proper oral hygiene  Community Resource Referral / Chronic Care Management: CRR required this visit?  No   CCM required this visit?  No      Plan:     I have personally reviewed and  noted the following in the patient's chart:   . Medical and social history . Use of alcohol, tobacco or illicit drugs  . Current medications and supplements . Functional ability and status . Nutritional status . Physical activity . Advanced directives . List of other physicians . Hospitalizations, surgeries, and ER visits in previous 12 months . Vitals . Screenings to include cognitive, depression, and falls . Referrals and appointments  In addition, I have reviewed and discussed with patient certain preventive protocols, quality metrics, and best practice recommendations. A written personalized care plan for preventive services as well as general preventive health recommendations were provided to patient.     SOfilia Neas LPN   37/0/3500  Nurse Notes: None

## 2020-07-27 ENCOUNTER — Telehealth: Payer: Self-pay | Admitting: Family Medicine

## 2020-07-27 ENCOUNTER — Ambulatory Visit (INDEPENDENT_AMBULATORY_CARE_PROVIDER_SITE_OTHER): Payer: Medicare Other

## 2020-07-27 DIAGNOSIS — Z0000 Encounter for general adult medical examination without abnormal findings: Secondary | ICD-10-CM | POA: Diagnosis not present

## 2020-07-27 DIAGNOSIS — H9313 Tinnitus, bilateral: Secondary | ICD-10-CM

## 2020-07-27 DIAGNOSIS — Z1231 Encounter for screening mammogram for malignant neoplasm of breast: Secondary | ICD-10-CM

## 2020-07-27 DIAGNOSIS — Z780 Asymptomatic menopausal state: Secondary | ICD-10-CM

## 2020-07-27 NOTE — Telephone Encounter (Signed)
Ok

## 2020-07-27 NOTE — Patient Instructions (Signed)
Terri Gray , Thank you for taking time to come for your Medicare Wellness Visit. I appreciate your ongoing commitment to your health goals. Please review the following plan we discussed and let me know if I can assist you in the future.   Screening recommendations/referrals: Colonoscopy: Up to date, next due 03/10/2030 Mammogram: Currently due, orders placed this visit  Bone Density: Currently due order placed this visit  Recommended yearly ophthalmology/optometry visit for glaucoma screening and checkup Recommended yearly dental visit for hygiene and checkup  Vaccinations: Influenza vaccine: Up to date, next due fall 2022  Pneumococcal vaccine: Currently due for next Pneumonia vaccine. You may receive at your next in person office visit  Tdap vaccine: Currently due, you may await and injury or you may receive at your local pharmacy  Shingles vaccine: Currently due for your second Shingrix . Please contact your pharmacy and schedule    Advanced directives: Please bring in copies of your advanced medical directives so that we may scan into your chart.   Conditions/risks identified: None   Next appointment: 08/02/2021 @ 8:15 am with Taylor 65 Years and Older, Female Preventive care refers to lifestyle choices and visits with your health care provider that can promote health and wellness. What does preventive care include?  A yearly physical exam. This is also called an annual well check.  Dental exams once or twice a year.  Routine eye exams. Ask your health care provider how often you should have your eyes checked.  Personal lifestyle choices, including:  Daily care of your teeth and gums.  Regular physical activity.  Eating a healthy diet.  Avoiding tobacco and drug use.  Limiting alcohol use.  Practicing safe sex.  Taking low-dose aspirin every day.  Taking vitamin and mineral supplements as recommended by your health care  provider. What happens during an annual well check? The services and screenings done by your health care provider during your annual well check will depend on your age, overall health, lifestyle risk factors, and family history of disease. Counseling  Your health care provider may ask you questions about your:  Alcohol use.  Tobacco use.  Drug use.  Emotional well-being.  Home and relationship well-being.  Sexual activity.  Eating habits.  History of falls.  Memory and ability to understand (cognition).  Work and work Statistician.  Reproductive health. Screening  You may have the following tests or measurements:  Height, weight, and BMI.  Blood pressure.  Lipid and cholesterol levels. These may be checked every 5 years, or more frequently if you are over 36 years old.  Skin check.  Lung cancer screening. You may have this screening every year starting at age 83 if you have a 30-pack-year history of smoking and currently smoke or have quit within the past 15 years.  Fecal occult blood test (FOBT) of the stool. You may have this test every year starting at age 48.  Flexible sigmoidoscopy or colonoscopy. You may have a sigmoidoscopy every 5 years or a colonoscopy every 10 years starting at age 47.  Hepatitis C blood test.  Hepatitis B blood test.  Sexually transmitted disease (STD) testing.  Diabetes screening. This is done by checking your blood sugar (glucose) after you have not eaten for a while (fasting). You may have this done every 1-3 years.  Bone density scan. This is done to screen for osteoporosis. You may have this done starting at age 10.  Mammogram. This may be  done every 1-2 years. Talk to your health care provider about how often you should have regular mammograms. Talk with your health care provider about your test results, treatment options, and if necessary, the need for more tests. Vaccines  Your health care provider may recommend certain  vaccines, such as:  Influenza vaccine. This is recommended every year.  Tetanus, diphtheria, and acellular pertussis (Tdap, Td) vaccine. You may need a Td booster every 10 years.  Zoster vaccine. You may need this after age 71.  Pneumococcal 13-valent conjugate (PCV13) vaccine. One dose is recommended after age 94.  Pneumococcal polysaccharide (PPSV23) vaccine. One dose is recommended after age 87. Talk to your health care provider about which screenings and vaccines you need and how often you need them. This information is not intended to replace advice given to you by your health care provider. Make sure you discuss any questions you have with your health care provider. Document Released: 06/04/2015 Document Revised: 01/26/2016 Document Reviewed: 03/09/2015 Elsevier Interactive Patient Education  2017 [place] Prevention in the Home Falls can cause injuries. They can happen to people of all ages. There are many things you can do to make your home safe and to help prevent falls. What can I do on the outside of my home?  Regularly fix the edges of walkways and driveways and fix any cracks.  Remove anything that might make you trip as you walk through a door, such as a raised step or threshold.  Trim any bushes or trees on the path to your home.  Use bright outdoor lighting.  Clear any walking paths of anything that might make someone trip, such as rocks or tools.  Regularly check to see if handrails are loose or broken. Make sure that both sides of any steps have handrails.  Any raised decks and porches should have guardrails on the edges.  Have any leaves, snow, or ice cleared regularly.  Use sand or salt on walking paths during winter.  Clean up any spills in your garage right away. This includes oil or grease spills. What can I do in the bathroom?  Use night lights.  Install grab bars by the toilet and in the tub and shower. Do not use towel bars as grab  bars.  Use non-skid mats or decals in the tub or shower.  If you need to sit down in the shower, use a plastic, non-slip stool.  Keep the floor dry. Clean up any water that spills on the floor as soon as it happens.  Remove soap buildup in the tub or shower regularly.  Attach bath mats securely with double-sided non-slip rug tape.  Do not have throw rugs and other things on the floor that can make you trip. What can I do in the bedroom?  Use night lights.  Make sure that you have a light by your bed that is easy to reach.  Do not use any sheets or blankets that are too big for your bed. They should not hang down onto the floor.  Have a firm chair that has side arms. You can use this for support while you get dressed.  Do not have throw rugs and other things on the floor that can make you trip. What can I do in the kitchen?  Clean up any spills right away.  Avoid walking on wet floors.  Keep items that you use a lot in easy-to-reach places.  If you need to reach something above you, use  a strong step stool that has a grab bar.  Keep electrical cords out of the way.  Do not use floor polish or wax that makes floors slippery. If you must use wax, use non-skid floor wax.  Do not have throw rugs and other things on the floor that can make you trip. What can I do with my stairs?  Do not leave any items on the stairs.  Make sure that there are handrails on both sides of the stairs and use them. Fix handrails that are broken or loose. Make sure that handrails are as long as the stairways.  Check any carpeting to make sure that it is firmly attached to the stairs. Fix any carpet that is loose or worn.  Avoid having throw rugs at the top or bottom of the stairs. If you do have throw rugs, attach them to the floor with carpet tape.  Make sure that you have a light switch at the top of the stairs and the bottom of the stairs. If you do not have them, ask someone to add them for  you. What else can I do to help prevent falls?  Wear shoes that:  Do not have high heels.  Have rubber bottoms.  Are comfortable and fit you well.  Are closed at the toe. Do not wear sandals.  If you use a stepladder:  Make sure that it is fully opened. Do not climb a closed stepladder.  Make sure that both sides of the stepladder are locked into place.  Ask someone to hold it for you, if possible.  Clearly mark and make sure that you can see:  Any grab bars or handrails.  First and last steps.  Where the edge of each step is.  Use tools that help you move around (mobility aids) if they are needed. These include:  Canes.  Walkers.  Scooters.  Crutches.  Turn on the lights when you go into a dark area. Replace any light bulbs as soon as they burn out.  Set up your furniture so you have a clear path. Avoid moving your furniture around.  If any of your floors are uneven, fix them.  If there are any pets around you, be aware of where they are.  Review your medicines with your doctor. Some medicines can make you feel dizzy. This can increase your chance of falling. Ask your doctor what other things that you can do to help prevent falls. This information is not intended to replace advice given to you by your health care provider. Make sure you discuss any questions you have with your health care provider. Document Released: 03/04/2009 Document Revised: 10/14/2015 Document Reviewed: 06/12/2014 Elsevier Interactive Patient Education  2017 [employer].  .

## 2020-07-27 NOTE — Telephone Encounter (Signed)
Patient has been having some tinnitus like symptoms and she would like to know if she can be referred to ENT. Please advise

## 2020-07-28 NOTE — Telephone Encounter (Signed)
Spoke with patient and informed her that we have placed referral. Patient verbalized understanding.

## 2020-07-28 NOTE — Telephone Encounter (Signed)
Left message return call. Orders placed for ENT Referral

## 2020-08-16 ENCOUNTER — Encounter: Payer: Self-pay | Admitting: Family Medicine

## 2020-09-17 DIAGNOSIS — Z794 Long term (current) use of insulin: Secondary | ICD-10-CM | POA: Diagnosis not present

## 2020-09-17 DIAGNOSIS — E782 Mixed hyperlipidemia: Secondary | ICD-10-CM | POA: Diagnosis not present

## 2020-09-17 DIAGNOSIS — E039 Hypothyroidism, unspecified: Secondary | ICD-10-CM | POA: Diagnosis not present

## 2020-09-17 DIAGNOSIS — I10 Essential (primary) hypertension: Secondary | ICD-10-CM | POA: Diagnosis not present

## 2020-09-17 DIAGNOSIS — E1142 Type 2 diabetes mellitus with diabetic polyneuropathy: Secondary | ICD-10-CM | POA: Diagnosis not present

## 2020-09-28 ENCOUNTER — Other Ambulatory Visit: Payer: Self-pay | Admitting: Family Medicine

## 2020-09-28 DIAGNOSIS — M26609 Unspecified temporomandibular joint disorder, unspecified side: Secondary | ICD-10-CM

## 2020-10-21 ENCOUNTER — Telehealth: Payer: Self-pay | Admitting: Family Medicine

## 2020-10-21 NOTE — Chronic Care Management (AMB) (Signed)
  Chronic Care Management   Note  10/21/2020 Name: [name] MRN: [id number] DOB: [birth year]  Terri Gray is a 65 y.o. year old female who is a primary care patient of [name], MD. I reached out to [employer] by phone today in response to a referral sent by [name]'s PCP, [name], MD.   [name] was given information about Chronic Care Management services today including:  1. CCM service includes personalized support from designated clinical staff supervised by her physician, including individualized plan of care and coordination with other care providers 2. 24/7 contact phone numbers for assistance for urgent and routine care needs. 3. Service will only be billed when office clinical staff spend 20 minutes or more in a month to coordinate care. 4. Only one practitioner may furnish and bill the service in a calendar month. 5. The patient may stop CCM services at any time (effective at the end of the month) by phone call to the office staff.   Patient agreed to services and verbal consent obtained.   Follow up plan:   Tatjana Secretary/administrator

## 2020-10-27 ENCOUNTER — Ambulatory Visit (INDEPENDENT_AMBULATORY_CARE_PROVIDER_SITE_OTHER): Payer: Medicare Other | Admitting: Otolaryngology

## 2020-10-27 ENCOUNTER — Other Ambulatory Visit: Payer: Self-pay

## 2020-10-27 VITALS — Temp 97.3°F

## 2020-10-27 DIAGNOSIS — H903 Sensorineural hearing loss, bilateral: Secondary | ICD-10-CM

## 2020-10-27 DIAGNOSIS — H9313 Tinnitus, bilateral: Secondary | ICD-10-CM | POA: Diagnosis not present

## 2020-10-27 NOTE — Progress Notes (Signed)
HPI: Terri Gray is a 65 y.o. female who presents is referred by Dr. Volanda Napoleon for evaluation of of complaints of decreased hearing buzzing or ringing in her ears for the past several years.  She also has complaints of occasional dizziness or vertigo that she has had for years.  She is not sure what causes this.  She has not noted any sudden change in her hearing but has noticed more of a gradual change in hearing.  Past Medical History:  Diagnosis Date  . Allergy   . Arthritis   . Asthma   . Chicken pox   . Colon polyps   . Diabetes mellitus without complication ([place])   . GERD (gastroesophageal reflux disease)   . H/O fracture of nose    Broken by ENT  . History of colon polyps   . Hyperlipidemia   . Hypertension   . IBS (irritable bowel syndrome)   . Migraines   . Obesity   . Shingles 2012  . Sleep apnea    on CPAP machine but uses it 90% of the time because it gives her some complication and does a mouth guard as well  . Thyroid disease    Past Surgical History:  Procedure Laterality Date  . COLONOSCOPY     Around 2014- 2015 in [place]  . ESOPHAGOGASTRODUODENOSCOPY     Around 2014-2015 [place]  . REPLACEMENT TOTAL KNEE Left 2016   Social History   Socioeconomic History  . Marital status: Married    Spouse name: Not on file  . Number of children: 3  . Years of education: 74  . Highest education level: Associate degree: occupational, Hotel manager, or vocational program  Occupational History  . Not on file  Tobacco Use  . Smoking status: Never Smoker  . Smokeless tobacco: Never Used  Vaping Use  . Vaping Use: Never used  Substance and Sexual Activity  . Alcohol use: No    Comment: last drink was over 30 years ago  . Drug use: No  . Sexual activity: Not on file  Other Topics Concern  . Not on file  Social History Narrative   HH 3    Married   Son lives with them   1 dog   Enjoys reading, tv, computer   Social Determinants of Health    Financial Resource Strain: Low Risk   . Difficulty of Paying Living Expenses: Not hard at all  Food Insecurity: No Food Insecurity  . Worried About Charity fundraiser in the Last Year: Never true  . Ran Out of Food in the Last Year: Never true  Transportation Needs: No Transportation Needs  . Lack of Transportation (Medical): No  . Lack of Transportation (Non-Medical): No  Physical Activity: Sufficiently Active  . Days of Exercise per Week: 7 days  . Minutes of Exercise per Session: 30 min  Stress: No Stress Concern Present  . Feeling of Stress : Not at all  Social Connections: Moderately Isolated  . Frequency of Communication with Friends and Family: More than three times a week  . Frequency of Social Gatherings with Friends and Family: Three times a week  . Attends Religious Services: Never  . Active Member of Clubs or Organizations: No  . Attends Archivist Meetings: Never  . Marital Status: Married   Family History  Problem Relation Age of Onset  . Diabetes Father   . Kidney disease Father   . Heart failure Father   . Irritable bowel  syndrome Father   . Bronchitis Mother   . Ovarian cancer Mother 38  . GER disease Mother   . Colon polyps Mother   . Irritable bowel syndrome Mother   . Diabetes Sister   . Asthma Sister   . Urticaria Sister   . Diabetes Brother   . Diabetes Sister   . Allergic rhinitis Son   . Allergic rhinitis Daughter   . GER disease Daughter   . Irritable bowel syndrome Daughter   . Allergic rhinitis Grandchild   . Food Allergy Grandchild   . Irritable bowel syndrome Daughter   . Immunodeficiency Neg Hx   . Angioedema Neg Hx    Allergies  Allergen Reactions  . Gabapentin     Stomach ache   Prior to Admission medications   Medication Sig Start Date End Date Taking? Authorizing Provider  albuterol (VENTOLIN HFA) 108 (90 Base) MCG/ACT inhaler Inhale 2 puffs into the lungs every 6 (six) hours as needed for wheezing or shortness of  breath. 10/08/19   Martinique, Betty G, MD  cyclobenzaprine (FLEXERIL) 10 MG tablet Take 1 tablet by mouth three times daily as needed for muscle spasm 09/28/20   [name], MD  esomeprazole (NEXIUM) 40 MG capsule Take 40 mg twice a day for 4 weeks. Then 1 daily thereafter. 05/12/20   Cirigliano, Vito V, DO  famotidine (PEPCID) 20 MG tablet Take 1 tablet (20 mg total) by mouth 2 (two) times daily as needed. Patient not taking: Reported on 07/27/2020 02/03/20   Bobbitt, Sedalia Muta, MD  fluticasone [facility]) 50 MCG/ACT nasal spray Place 1-2 sprays into both nostrils daily as needed. 02/03/20   Bobbitt, Sedalia Muta, MD  glucose blood (TRUE METRIX BLOOD GLUCOSE TEST) test strip 1 each by Other route 4 (four) times daily as needed for other. And lancets 2/day 08/28/17   [name], MD  hydrocortisone 2.5 % ointment Apply topically. 03/16/20   [provider]  hydrocortisone valerate ointment (WESTCORT) 0.2 % Apply 1 application topically 2 (two) times daily. 12/03/19   [name], MD  ibuprofen (ADVIL) 800 MG tablet Take 800 mg by mouth every 8 (eight) hours as needed. 09/03/19   [provider]  LANTUS SOLOSTAR 100 UNIT/ML Solostar Pen INJECT 85 UNITS SUBCUTANEOUSLY ONCE DAILY IN THE MORNING 10/27/19   Renato Shin, MD  levocetirizine [facility]) 5 MG tablet May take 1 tablet once or twice a day as needed 02/03/20   Bobbitt, Sedalia Muta, MD  levothyroxine (SYNTHROID) 75 MCG tablet Take 1 tablet (75 mcg total) by mouth daily before breakfast. 06/03/19   Renato Shin, MD  metFORMIN (GLUCOPHAGE) 1000 MG tablet Take 1 tablet (1,000 mg total) by mouth 2 (two) times daily with a meal. 12/17/19   [name], MD  metoprolol tartrate (LOPRESSOR) 25 MG tablet Take 1 tablet (25 mg total) by mouth 2 (two) times daily. 12/17/19   [name], MD  Olopatadine HCl (PATADAY) 0.2 % SOLN Place 1 drop into both eyes daily as needed. 02/03/20   Bobbitt, Sedalia Muta, MD  Semaglutide,0.25 or  0.5MG/DOS, 2 MG/1.5ML SOPN Inject into the skin. 05/19/20   [provider]  simvastatin (ZOCOR) 40 MG tablet Take 1 tablet (40 mg total) by mouth daily. 12/17/19   [name], MD     Positive ROS: Otherwise negative  All other systems have been reviewed and were otherwise negative with the exception of those mentioned in the HPI and as above.  Physical Exam:  Constitutional: Alert, well-appearing, no acute distress Ears: External ears without lesions or tenderness. Ear canals are clear bilaterally.  Both TMs are clear although the right TM is slightly retracted.  There is no middle ear fluid noted.  On Dix-Hallpike testing there is no clinical evidence of BPPV. Nasal: External nose without lesions. Septum with mild deviation and moderate rhinitis.  Both middle meatus regions were clear with no signs of infection.. Oral: Lips and gums without lesions. Tongue and palate mucosa without lesions. Posterior oropharynx clear. Neck: No palpable adenopathy or masses Respiratory: Breathing comfortably  Skin: No facial/neck lesions or rash noted.  Audiologic testing in the office today demonstrated a mild to moderate downsloping sensorineural hearing loss in both ears which was symmetric.  SRT's were 20 dB bilaterally.  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 Dizziness questionable etiology.  No clinical evidence of BPPV or Mnire's disease.  Plan: Reviewed with patient concerning tinnitus with limited treatment options.  Recommended using background noise such as white noise to help when the tinnitus is bad.  Also discussed with her concerning using ear protection when around any loud noise although she states that she is never really exposed to much loud noise. She would be a candidate for hearing aids and briefly discussed this with her as this will help her  hearing as well as will also help with the tinnitus.  She will follow-up with our audiologist concerning possibly obtaining hearing aids.   Radene Journey, MD   CC:

## 2020-11-01 ENCOUNTER — Encounter (INDEPENDENT_AMBULATORY_CARE_PROVIDER_SITE_OTHER): Payer: Self-pay

## 2020-11-24 ENCOUNTER — Encounter: Payer: Self-pay | Admitting: Family Medicine

## 2020-11-24 ENCOUNTER — Telehealth (INDEPENDENT_AMBULATORY_CARE_PROVIDER_SITE_OTHER): Payer: Medicare Other | Admitting: Family Medicine

## 2020-11-24 DIAGNOSIS — U071 COVID-19: Secondary | ICD-10-CM

## 2020-11-24 DIAGNOSIS — I10 Essential (primary) hypertension: Secondary | ICD-10-CM

## 2020-11-24 DIAGNOSIS — T24111A Burn of first degree of right thigh, initial encounter: Secondary | ICD-10-CM

## 2020-11-24 DIAGNOSIS — M26609 Unspecified temporomandibular joint disorder, unspecified side: Secondary | ICD-10-CM

## 2020-11-24 DIAGNOSIS — K219 Gastro-esophageal reflux disease without esophagitis: Secondary | ICD-10-CM | POA: Diagnosis not present

## 2020-11-24 MED ORDER — CYCLOBENZAPRINE HCL 10 MG PO TABS: 10.0000 mg | ORAL_TABLET | Freq: Every day | ORAL | 1 refills | Status: DC | PRN

## 2020-11-24 MED ORDER — MOLNUPIRAVIR EUA 200MG CAPSULE: 4.0000 | ORAL_CAPSULE | Freq: Two times a day (BID) | ORAL | 0 refills | Status: AC

## 2020-11-24 MED ORDER — ESOMEPRAZOLE MAGNESIUM 40 MG PO CPDR: 40.0000 mg | DELAYED_RELEASE_CAPSULE | Freq: Every day | ORAL | 3 refills | Status: DC

## 2020-11-24 MED ORDER — SILVER SULFADIAZINE 1 % EX CREA: 1.0000 "application " | TOPICAL_CREAM | Freq: Every day | CUTANEOUS | 0 refills | Status: DC

## 2020-11-24 MED ORDER — METOPROLOL TARTRATE 25 MG PO TABS: 25.0000 mg | ORAL_TABLET | Freq: Two times a day (BID) | ORAL | 3 refills | Status: DC

## 2020-11-24 NOTE — Progress Notes (Signed)
Virtual Visit via Video Note  I connected with Terri Gray on 11/24/20 at  4:30 PM EDT by a video enabled telemedicine application 2/2 [id number] pandemic and verified that I am speaking with the correct person using two identifiers.  Location patient: home Location provider:work or home office Persons participating in the virtual visit: patient, provider  I discussed the limitations of evaluation and management by telemedicine and the availability of in person appointments. The patient expressed understanding and agreed to proceed.  Chief Complaint  Patient presents with   Covid Positive    Home tested this morning, dry cough, sinus congestion, headaches, light headed, upset stomach -feels like she can throw up, thinks it is from the migraines. Fever was 100.2 this morning. Has taken migraine med but only helps for a little while, also taken nausea pills for upset stomach.     HPI: Pt was around her son 11 days ago who had COVID, but didn't known it.  Pt's husband developed symptoms and tested positive last wk.  Pt started feeling sick today dry cough, fever (Tmax 100.2 F), sinus drainage, HAs, nausea, chills, and lightheadedness when having ear pain/pressure.  Denies sore throat, emesis, diarrhea.  She took Tylenol for sx.  Pt fully vaccinated and boosted x 2.  Pt thought fever was 2/2 a burn on her R thigh.  Pt dropped a kettle while making coffee and burned her leg yesterday. Area is painful, had a large blister.  Patient requesting refill on metoprolol for HTN, Flexeril as needed for TMJ is starting to act up, and PPI for GERD.  ROS: See pertinent positives and negatives per HPI.  Past Medical History:  Diagnosis Date   Allergy    Arthritis    Asthma    Chicken pox    Colon polyps    Diabetes mellitus without complication (HCC)    GERD (gastroesophageal reflux disease)    H/O fracture of nose    Broken by ENT   History of colon polyps    Hyperlipidemia    Hypertension     IBS (irritable bowel syndrome)    Migraines    Obesity    Shingles 2012   Sleep apnea    on CPAP machine but uses it 90% of the time because it gives her some complication and does a mouth guard as well   Thyroid disease     Past Surgical History:  Procedure Laterality Date   COLONOSCOPY     Around 2014- 2015 in [place]   ESOPHAGOGASTRODUODENOSCOPY     Around 2014-2015 [place]   REPLACEMENT TOTAL KNEE Left 2016    Family History  Problem Relation Age of Onset   Diabetes Father    Kidney disease Father    Heart failure Father    Irritable bowel syndrome Father    Bronchitis Mother    Ovarian cancer Mother 54   GER disease Mother    Colon polyps Mother    Irritable bowel syndrome Mother    Diabetes Sister    Asthma Sister    Urticaria Sister    Diabetes Brother    Diabetes Sister    Allergic rhinitis Son    Allergic rhinitis Daughter    GER disease Daughter    Irritable bowel syndrome Daughter    Allergic rhinitis Grandchild    Food Allergy Grandchild    Irritable bowel syndrome Daughter    Immunodeficiency Neg Hx    Angioedema Neg Hx     Current Outpatient Medications:  albuterol (VENTOLIN HFA) 108 (90 Base) MCG/ACT inhaler, Inhale 2 puffs into the lungs every 6 (six) hours as needed for wheezing or shortness of breath., Disp: 18 g, Rfl: 1   cyclobenzaprine (FLEXERIL) 10 MG tablet, Take 1 tablet by mouth three times daily as needed for muscle spasm, Disp: 90 tablet, Rfl: 0   esomeprazole (NEXIUM) 40 MG capsule, Take 40 mg twice a day for 4 weeks. Then 1 daily thereafter., Disp: 60 capsule, Rfl: 1   fluticasone (FLONASE) 50 MCG/ACT nasal spray, Place 1-2 sprays into both nostrils daily as needed., Disp: 16 g, Rfl: 5   glucose blood (TRUE METRIX BLOOD GLUCOSE TEST) test strip, 1 each by Other route 4 (four) times daily as needed for other. And lancets 2/day, Disp: 360 each, Rfl: 3   hydrocortisone 2.5 % ointment, Apply topically., Disp: , Rfl:     hydrocortisone valerate ointment (WESTCORT) 0.2 %, Apply 1 application topically 2 (two) times daily., Disp: 60 g, Rfl: 1   ibuprofen (ADVIL) 800 MG tablet, Take 800 mg by mouth every 8 (eight) hours as needed., Disp: , Rfl:    LANTUS SOLOSTAR 100 UNIT/ML Solostar Pen, INJECT 85 UNITS SUBCUTANEOUSLY ONCE DAILY IN THE MORNING, Disp: 75 mL, Rfl: 0   levocetirizine (XYZAL) 5 MG tablet, May take 1 tablet once or twice a day as needed, Disp: 60 tablet, Rfl: 5   levothyroxine (SYNTHROID) 75 MCG tablet, Take 1 tablet (75 mcg total) by mouth daily before breakfast., Disp: 90 tablet, Rfl: 3   metFORMIN (GLUCOPHAGE) 1000 MG tablet, Take 1 tablet (1,000 mg total) by mouth 2 (two) times daily with a meal., Disp: 180 tablet, Rfl: 3   metoprolol tartrate (LOPRESSOR) 25 MG tablet, Take 1 tablet (25 mg total) by mouth 2 (two) times daily., Disp: 180 tablet, Rfl: 3   Olopatadine HCl (PATADAY) 0.2 % SOLN, Place 1 drop into both eyes daily as needed., Disp: 2.5 mL, Rfl: 5   Semaglutide,0.25 or 0.5MG/DOS, 2 MG/1.5ML SOPN, Inject into the skin., Disp: , Rfl:    simvastatin (ZOCOR) 40 MG tablet, Take 1 tablet (40 mg total) by mouth daily., Disp: 90 tablet, Rfl: 3   famotidine (PEPCID) 20 MG tablet, Take 1 tablet (20 mg total) by mouth 2 (two) times daily as needed. (Patient not taking: No sig reported), Disp: 60 tablet, Rfl: 5  EXAM:  VITALS per patient if applicable: RR between 62-26 bpm  GENERAL: alert, oriented, appears well and in no acute distress  HEENT: atraumatic, conjunctiva clear, no obvious abnormalities on inspection of external nose and ears  NECK: normal movements of the head and neck  LUNGS: on inspection no signs of respiratory distress, breathing rate appears normal, no obvious gross SOB, gasping or wheezing  CV: no obvious cyanosis  Skin: fairly large erythematous vesicle covering a large area of R anterior thigh.    MS: moves all visible extremities without noticeable  abnormality  PSYCH/NEURO: pleasant and cooperative, no obvious depression or anxiety, speech and thought processing grossly intact  ASSESSMENT AND PLAN:  Discussed the following assessment and plan:  COVID-19 virus infection  -Positive home test this morning after recent exposures -Continue supportive care including Flonase, Tylenol, OTC cough/cold medications, hydration, rest -Discussed r/b/a of antiviral medications. -Given strict precautions - Plan: molnupiravir EUA 200 mg CAPS  Superficial burn of right thigh, initial encounter  -Discussed the importance of keeping area clean and dry -We will apply Silvadene cream daily -Close follow-up -Handout provided via MyChart -Given strict precautions -  Plan: silver sulfADIAZINE (SILVADENE) 1 % cream  Essential hypertension -Stable - Plan: metoprolol tartrate (LOPRESSOR) 25 MG tablet  TMJ (temporomandibular joint disorder) -Flexeril as needed - Plan: cyclobenzaprine (FLEXERIL) 10 MG tablet  Gastroesophageal reflux disease, unspecified whether esophagitis present -Continue to avoid foods known to cause problems - Plan: esomeprazole (NEXIUM) 40 MG capsule     I discussed the assessment and treatment plan with the patient. The patient was provided an opportunity to ask questions and all were answered. The patient agreed with the plan and demonstrated an understanding of the instructions.   The patient was advised to call back or seek an in-person evaluation if the symptoms worsen or if the condition fails to improve as anticipated.  [name], MD

## 2020-12-13 ENCOUNTER — Ambulatory Visit: Payer: Medicare Other

## 2020-12-20 ENCOUNTER — Telehealth: Payer: Self-pay | Admitting: Pharmacist

## 2020-12-20 NOTE — Chronic Care Management (AMB) (Signed)
Chronic Care Management Pharmacy Assistant   Name: Terri Gray  MRN: [id number] DOB: 03-21-1956  Chart review for CPP visit on 12/21/2020   Conditions to be addressed/monitored: DMII and GERD and Asthma  Recent office visits:  11-24-2020 Terri Ruddy, MD ( PCP) - Patient presented for televisit for COVID-19 virus infection and other concerns. Prescribed Molnupiravir EUA 200 mg and SILVADENE 1 % cream. Changed Cyclobenzaprine to 10 mg PRN and Nexium to 90m 2x a day for  weeks then once a day after 4 weeks.  07-27-2020 Terri Grill LPN (PCP) - Patient presented for Annual Wellness Exam. Discontinued Hydroxizine 246m Pantoprazole 4054mClenpiq 10-3.5-12 MG-GM -GM/160ML, Tacrolimus, and Triamcinolone cream.  Recent consult visits:  10-27-2020 NewRozetta NunneryD (Otolaryngology) - Patient presented for Sensorineural hearing loss (SNHL) of both ears and other concerns. Non medication changes. Plan : To follow up about hearing aids with audiologist.  09-17-2020 [facility]) - Patient presented for Type 2 diabetes mellitus with diabetic polyneuropathy, Essential hypertension; Mixed hyperlipidemia; and Acquired hypothyroidism. No medication changes available. No visit info available.  Hospital visits:  None in previous 6 months  Medications: Outpatient Encounter Medications as of 12/20/2020  Medication Sig Note   albuterol (VENTOLIN HFA) 108 (90 Base) MCG/ACT inhaler Inhale 2 puffs into the lungs every 6 (six) hours as needed for wheezing or shortness of breath.    cyclobenzaprine (FLEXERIL) 10 MG tablet Take 1 tablet (10 mg total) by mouth daily as needed (TMJ pain). for muscle spams    esomeprazole (NEXIUM) 40 MG capsule Take 1 capsule (40 mg total) by mouth daily. Take 40 mg twice a day for 4 weeks. Then 1 daily thereafter.    famotidine (PEPCID) 20 MG tablet Take 1 tablet (20 mg total) by mouth 2 (two) times daily as needed. (Patient not taking: No  sig reported)    fluticasone (FLONASE) 50 MCG/ACT nasal spray Place 1-2 sprays into both nostrils daily as needed.    glucose blood (TRUE METRIX BLOOD GLUCOSE TEST) test strip 1 each by Other route 4 (four) times daily as needed for other. And lancets 2/day    hydrocortisone 2.5 % ointment Apply topically.    hydrocortisone valerate ointment (WESTCORT) 0.2 % Apply 1 application topically 2 (two) times daily.    ibuprofen (ADVIL) 800 MG tablet Take 800 mg by mouth every 8 (eight) hours as needed.    LANTUS SOLOSTAR 100 UNIT/ML Solostar Pen INJECT 85 UNITS SUBCUTANEOUSLY ONCE DAILY IN THE MORNING 07/27/2020: Take 45 units     levocetirizine (XYZAL) 5 MG tablet May take 1 tablet once or twice a day as needed    levothyroxine (SYNTHROID) 75 MCG tablet Take 1 tablet (75 mcg total) by mouth daily before breakfast.    metFORMIN (GLUCOPHAGE) 1000 MG tablet Take 1 tablet (1,000 mg total) by mouth 2 (two) times daily with a meal.    metoprolol tartrate (LOPRESSOR) 25 MG tablet Take 1 tablet (25 mg total) by mouth 2 (two) times daily.    Olopatadine HCl (PATADAY) 0.2 % SOLN Place 1 drop into both eyes daily as needed.    Semaglutide,0.25 or 0.5MG/DOS, 2 MG/1.5ML SOPN Inject into the skin.    silver sulfADIAZINE (SILVADENE) 1 % cream Apply 1 application topically daily.    simvastatin (ZOCOR) 40 MG tablet Take 1 tablet (40 mg total) by mouth daily.    No facility-administered encounter medications on file as of 12/20/2020.  Recent Relevant Labs: Lab Results  Component Value Date/Time   HGBA1C 6.8 (H) 12/03/2019 09:34 AM   HGBA1C 6.8 (H) 03/11/2019 07:57 AM    Kidney Function Lab Results  Component Value Date/Time   CREATININE 0.78 02/05/2020 08:48 AM   CREATININE 0.82 12/03/2019 09:34 AM   CREATININE 0.85 03/11/2019 07:57 AM   GFR 81.47 03/11/2019 07:57 AM   GFRNONAA 81 02/05/2020 08:48 AM   GFRAA 93 02/05/2020 08:48 AM   Have you seen any other providers since your last visit? Patient reports  none  Any changes in your medications or health? Patient reports none  Any side effects from any medications? Patient reports no  Do you have an symptoms or problems not managed by your medications? Patient reports no  Any concerns about your health right now? Yes Patient reports her mom passed away 2 weeks ago advised within her culture they observe and come together for 10 days and then 13 days, this took place in [place] and she had not done all she usually does during that time, had not been eating or hydrating properly and did not check her sugars because it was a lot going on and many people in a small space. One of those evenings she recalls getting up to use the restroom and feeling dizzy so she grabbed a soda out of the fridge and that's the last she remembers. Patent advised she was told she passed out and hit the back of her head really hard. There was no bleeding or anything but that was over a week ago and is still having pain in the area wants to know if it should be scanned or checked out.   Has your provider asked that you check blood pressure, blood sugar, or follow special diet at home? Yes, patient checks sugars at home regularly. For breakfast she has coffee, bread and or a glucose control boost. For lunch and dinner she will have a salad, sandwich and nann with veggies and maybe chicken or fish.    Do you get any type of exercise on a regular basis? Yes, she does all the housework .  Can you think of a goal you would like to reach for your health? She has lost a couple of pounds and would like to continue .  Do you have any problems getting your medications? Patient reports none Is there anything that you would like to discuss during the appointment? Patient reports none  Patient aware to  bring medications and supplements to appointment   Notes:  Patient reports she recently changed to Terri Gray in [facility] (Ozempic) 0.25 or 0.26m/DOS - samples from  provider out of samples currently    Care Gaps:  AVW - Scheduled 08-02-2020 at 8:15 Urine Micro - Overdue HIV screening - Overdue TDAP- Overdue PAP smear-Modifier- [employer] - Overdue Zoster Vaccine - Overdue Pneumovax - Overdue HGB A1C - Overdue Flu Vaccine - Overdue   Star Rating Drugs:  Simvastatin 414m- [facility] Semaglitude (Ozempic) - Patient receives samples from Dr when she visits Metformin 1000 mg - [facility]   [place] [facility] Pharmacist Assistant [phone]

## 2020-12-21 ENCOUNTER — Other Ambulatory Visit: Payer: Self-pay

## 2020-12-21 ENCOUNTER — Ambulatory Visit (INDEPENDENT_AMBULATORY_CARE_PROVIDER_SITE_OTHER): Payer: Medicare Other | Admitting: Pharmacist

## 2020-12-21 DIAGNOSIS — I10 Essential (primary) hypertension: Secondary | ICD-10-CM | POA: Diagnosis not present

## 2020-12-21 DIAGNOSIS — Z794 Long term (current) use of insulin: Secondary | ICD-10-CM

## 2020-12-21 DIAGNOSIS — E119 Type 2 diabetes mellitus without complications: Secondary | ICD-10-CM | POA: Diagnosis not present

## 2020-12-21 NOTE — Progress Notes (Signed)
Chronic Care Management Pharmacy Note  01/12/2021 Name:  Terri Gray MRN:  [id number] DOB:  05/08/1956  Summary: BP is at goal < 140/90 per office readings Pt reports persistent migraines  Recommendations/Changes made from today's visit: -Recommend repeat TSH and lipid panel as patient is overdue -Recommend switching metoprolol to propranolol or trial of low dose diltiazem for migraine prevention -Recommended routine BP monitoring at home -Requested updated cyclobenzaprine prescription  Plan: Follow up BP assessment in 1-2 months   Subjective: Terri Gray is an 65 y.o. year old female who is a primary patient of [name], MD.  The CCM team was consulted for assistance with disease management and care coordination needs.    Engaged with patient face to face for initial visit in response to provider referral for pharmacy case management and/or care coordination services.   Consent to Services:  The patient was given the following information about Chronic Care Management services today, agreed to services, and gave verbal consent: 1. CCM service includes personalized support from designated clinical staff supervised by the primary care provider, including individualized plan of care and coordination with other care providers 2. 24/7 contact phone numbers for assistance for urgent and routine care needs. 3. Service will only be billed when office clinical staff spend 20 minutes or more in a month to coordinate care. 4. Only one practitioner may furnish and bill the service in a calendar month. 5.The patient may stop CCM services at any time (effective at the end of the month) by phone call to the office staff. 6. The patient will be responsible for cost sharing (co-pay) of up to 20% of the service fee (after annual deductible is met). Patient agreed to services and consent obtained.  Patient Care Team: [name], MD as PCP - General (Family Medicine) [name], [facility] as Pharmacist (Pharmacist)  Recent office visits: 11-24-2020 [name], MD ( PCP) - Patient presented for televisit for COVID-19 virus infection and other concerns. Prescribed Molnupiravir EUA 200 mg and SILVADENE 1 % cream. Changed Cyclobenzaprine to 10 mg PRN and Nexium to 67m 2x a day for 4 weeks then once a day after 4 weeks.   07-27-2020 [name] LPN (PCP) - Patient presented for Annual Wellness Exam. Discontinued Hydroxyzine 29m Pantoprazole 4060mClenpiq 10-3.5-12 MG-GM -GM/160ML, Tacrolimus, and Triamcinolone cream.  Recent consult visits: 10-27-2020 NewRozetta NunneryD (Otolaryngology) - Patient presented for Sensorineural hearing loss (SNHL) of both ears and other concerns. Non medication changes. Plan : To follow up about hearing aids with audiologist.   09-17-2020 SmiDorathy KinsmanEndocrinology) - Patient presented for Type 2 diabetes mellitus with diabetic polyneuropathy, Essential hypertension; Mixed hyperlipidemia; and Acquired hypothyroidism. Restarted Ozempic 0.25 mg inject once weekly.  Hospital visits: None in previous 6 months   Objective:  Lab Results  Component Value Date   CREATININE 0.78 02/05/2020   BUN 9 02/05/2020   GFR 81.47 03/11/2019   GFRNONAA 81 02/05/2020   GFRAA 93 02/05/2020   NA 137 02/05/2020   K 4.6 02/05/2020   CALCIUM 9.2 02/05/2020   CO2 21 02/05/2020   GLUCOSE 169 (H) 02/05/2020    Lab Results  Component Value Date/Time   HGBA1C 6.8 (H) 12/03/2019 09:34 AM   HGBA1C 6.8 (H) 03/11/2019 07:57 AM   GFR 81.47 03/11/2019 07:57 AM   GFR 81.65 07/03/2018 10:51 AM    Last diabetic Eye exam:  Lab Results  Component Value Date/Time   HMDIABEYEEXA No  Retinopathy 08/29/2017 12:00 AM    Last diabetic Foot exam: No results found for: HMDIABFOOTEX   Lab Results  Component Value Date   CHOL 158 12/03/2019   HDL 44 (L) 12/03/2019   LDLCALC 85 12/03/2019   TRIG 191 (H) 12/03/2019   CHOLHDL 3.6  12/03/2019    Hepatic Function Latest Ref Rng & Units 02/05/2020 12/03/2019  Total Protein 6.0 - 8.5 g/dL 7.1 7.0  Albumin 3.8 - 4.8 g/dL 4.1 -  AST 0 - 40 IU/L 25 23  ALT 0 - 32 IU/L 16 19  Alk Phosphatase 44 - 121 IU/L 107 -  Total Bilirubin 0.0 - 1.2 mg/dL 0.2 0.5    Lab Results  Component Value Date/Time   TSH 0.97 03/11/2019 07:57 AM   TSH 0.64 07/03/2018 10:51 AM    CBC Latest Ref Rng & Units 02/05/2020 12/03/2019 03/11/2019  WBC 3.4 - 10.8 x10E3/uL 6.8 6.2 7.8  Hemoglobin 11.1 - 15.9 g/dL 11.7 11.6(L) 11.4(L)  Hematocrit 34.0 - 46.6 % 37.6 37.2 34.8(L)  Platelets 150 - 450 x10E3/uL 374 324 323.0    No results found for: VD25OH  Clinical ASCVD: No  The 10-year ASCVD risk score Mikey Bussing DC Jr., et al., 2013) is: 16.9%   Values used to calculate the score:     Age: 84 years     Sex: Female     Is Non-Hispanic African American: No     Diabetic: Yes     Tobacco smoker: No     Systolic Blood Pressure: 431 mmHg     Is BP treated: Yes     HDL Cholesterol: 44 mg/dL     Total Cholesterol: 158 mg/dL    Depression screen [facility] 2/9 07/27/2020 07/10/2019 02/28/2017  Decreased Interest 0 0 0  Down, Depressed, Hopeless 0 0 0  PHQ - 2 Score 0 0 0      Social History   Tobacco Use  Smoking Status Never  Smokeless Tobacco Never   BP Readings from Last 3 Encounters:  05/12/20 138/84  03/10/20 (!) 132/94  02/09/20 (!) 142/72   Pulse Readings from Last 3 Encounters:  03/10/20 81  02/09/20 76  02/06/20 77   Wt Readings from Last 3 Encounters:  05/12/20 211 lb (95.7 kg)  03/10/20 215 lb (97.5 kg)  02/09/20 215 lb (97.5 kg)   BMI Readings from Last 3 Encounters:  05/12/20 34.06 kg/m  03/10/20 34.70 kg/m  02/09/20 34.70 kg/m    Assessment/Interventions: Review of patient past medical history, allergies, medications, health status, including review of consultants reports, laboratory and other test data, was performed as part of comprehensive evaluation and provision of  chronic care management services.   SDOH:  (Social Determinants of Health) assessments and interventions performed: Yes SDOH Interventions    Flowsheet Row Most Recent Value  SDOH Interventions   Financial Strain Interventions Intervention Not Indicated  Transportation Interventions Intervention Not Indicated      SDOH Screenings   Alcohol Screen: Low Risk    Last Alcohol Screening Score (AUDIT): 0  Depression (PHQ2-9): Low Risk    PHQ-2 Score: 0  Financial Resource Strain: Low Risk    Difficulty of Paying Living Expenses: Not hard at all  Food Insecurity: No Food Insecurity   Worried About Charity fundraiser in the Last Year: Never true   Ran Out of Food in the Last Year: Never true  Housing: Low Risk    Last Housing Risk Score: 0  Physical Activity: Sufficiently Active  Days of Exercise per Week: 7 days   Minutes of Exercise per Session: 30 min  Social Connections: Moderately Isolated   Frequency of Communication with Friends and Family: More than three times a week   Frequency of Social Gatherings with Friends and Family: Three times a week   Attends Religious Services: Never   Active Member of Clubs or Organizations: No   Attends Archivist Meetings: Never   Marital Status: Married  Stress: No Stress Concern Present   Feeling of Stress : Not at all  Tobacco Use: Low Risk    Smoking Tobacco Use: Never   Smokeless Tobacco Use: Never  Transportation Needs: No Transportation Needs   Lack of Transportation (Medical): No   Lack of Transportation (Non-Medical): No   Patient reports she has lives int he [place] for 9 years but lived in [place] for 20 years before that and was born in [place]. She still has 2 sisters in [place].  Patient is retired but gets up early around 6:30 every morning. She usually drinks coffee, sits and relaxes and then uses the computer. She has had problems with allergies but she tries to go outside some. She reports she is more of a  calm and slower person, not rushing. Her husband and his family rush around a lot. Her husband's mom recently passed away at 38 years old.  Patient has 3 children but she doesn't speak to them too often. She has brother in [place] whom only calls when he is having a drink. She has a daughter in [place], and her youngest is the reason she moved to [place] for his job. Previously, she lived in [place]. Her son has since moved to [place] for work and she has a daughter still in [place].   Other than her PCP, she has been seeing endocrinology since 2008. She has liked Dr. Tamala Julian a lot and was previously with Dr. Loanne Drilling, whom she did not like at all as he did not listen well.   Patient reports she feels like she has had less of filter since turning 39.  CCM Care Plan  Allergies  Allergen Reactions   Gabapentin     Stomach ache    Medications Reviewed Today     Reviewed by [name], [facility] (Pharmacist) on 12/21/20 at 71  Med List Status: <None>   Medication Order Taking? Sig Documenting Provider Last Dose Status Informant  albuterol (VENTOLIN HFA) 108 (90 Base) MCG/ACT inhaler [id number] No Inhale 2 puffs into the lungs every 6 (six) hours as needed for wheezing or shortness of breath.  Patient not taking: Reported on 12/21/2020   Martinique, Betty G, MD Not Taking Active   cyclobenzaprine (FLEXERIL) 10 MG tablet [id number] Yes Take 1 tablet (10 mg total) by mouth daily as needed (TMJ pain). for muscle spams [name], MD Taking Active   esomeprazole (NEXIUM) 40 MG capsule [id number] Yes Take 1 capsule (40 mg total) by mouth daily. Take 40 mg twice a day for 4 weeks. Then 1 daily thereafter. [name], MD Taking Active   famotidine (PEPCID) 20 MG tablet [id number] No Take 1 tablet (20 mg total) by mouth 2 (two) times daily as needed.  Patient not taking: No sig reported   Bobbitt, Sedalia Muta, MD Not Taking Active   fluticasone (FLONASE) 50 MCG/ACT nasal spray [id number]  Yes Place 1-2 sprays into both nostrils daily as needed. Bobbitt, Sedalia Muta, MD Taking Active   glucose blood (TRUE METRIX  BLOOD GLUCOSE TEST) test strip [id number]  1 each by Other route 4 (four) times daily as needed for other. And lancets 2/day [name], MD  Active   hydrocortisone valerate ointment (WESTCORT) 0.2 % [id number] Yes Apply 1 application topically 2 (two) times daily. [name], MD Taking Active   ibuprofen (ADVIL) 200 MG tablet [id number] Yes Take 200 mg by mouth every 8 (eight) hours as needed. [provider] Taking Active   LANTUS SOLOSTAR 100 UNIT/ML Solostar Pen [id number]  INJECT 58 UNITS SUBCUTANEOUSLY ONCE DAILY IN THE Lynnell Grain, MD  Active            Med Note Al Corpus, [facility] R   Tue Jul 27, 2020  8:38 AM) Take 45 units    levothyroxine (SYNTHROID) 75 MCG tablet [id number] Yes Take 1 tablet (75 mcg total) by mouth daily before breakfast. Renato Shin, MD Taking Active   metFORMIN (GLUCOPHAGE) 1000 MG tablet [id number] Yes Take 1 tablet (1,000 mg total) by mouth 2 (two) times daily with a meal. [name], MD Taking Active   metoprolol tartrate (LOPRESSOR) 25 MG tablet [id number] Yes Take 1 tablet (25 mg total) by mouth 2 (two) times daily. [name], MD Taking Active   Olopatadine HCl (PATADAY) 0.2 % SOLN [id number] Yes Place 1 drop into both eyes daily as needed. Bobbitt, Sedalia Muta, MD Taking Active   Semaglutide,0.25 or 0.5MG/DOS, 2 MG/1.5ML SOPN [id number] No Inject into the skin.  Patient not taking: Reported on 12/21/2020   [provider] Not Taking Active   simvastatin (ZOCOR) 40 MG tablet [id number] Yes Take 1 tablet (40 mg total) by mouth daily. [name], MD Taking Active             Patient Active Problem List   Diagnosis Date Noted   GERD (gastroesophageal reflux disease) 02/09/2020   Urticaria 02/03/2020   Seasonal and perennial allergic rhinitis 02/03/2020   Allergic conjunctivitis 02/03/2020    Moderate persistent asthma 02/03/2020   Acquired hypothyroidism 12/17/2019   TMJ (temporomandibular joint disorder) 12/17/2019   Seasonal allergies 12/17/2019   Foot pain, left 07/16/2018   Palpitations 07/10/2018   Dyspnea on exertion 07/10/2018   Atypical chest pain 07/10/2018   Dizziness 07/10/2018   Localized osteoarthritis of right knee 02/28/2018   Radiculopathy of cervical spine 02/28/2018   Goiter 11/12/2017   Diabetes ([place]) 05/30/2017    Immunization History  Administered Date(s) Administered   Fluad Quad(high Dose 65+) 02/18/2019   Influenza,inj,Quad PF,6+ Mos 02/20/2018   Influenza-Unspecified 06/10/2020   PFIZER(Purple Top)SARS-COV-2 Vaccination 06/26/2019, 07/17/2019   Pneumococcal Conjugate-13 03/01/2018   Zoster Recombinat (Shingrix) 10/28/2019    Conditions to be addressed/monitored:  Hyperlipidemia, Diabetes, GERD, Asthma, Hypothyroidism, Osteoarthritis, and Allergic Rhinitis  Care Plan : [place]  Updates made by [name], [place] since 01/12/2021 12:00 AM     Problem: Problem: Hyperlipidemia, Diabetes, GERD, Asthma, Hypothyroidism, Osteoarthritis, and Allergic Rhinitis      Long-Range Goal: Patient-Specific Goal   Start Date: 12/21/2020  Expected End Date: 12/21/2021  This Visit's Progress: On track  Priority: High  Note:   Current Barriers:  Unable to independently monitor therapeutic efficacy Unable to achieve control of migraines   Pharmacist Clinical Goal(s):  Patient will achieve adherence to monitoring guidelines and medication adherence to achieve therapeutic efficacy through collaboration with PharmD and provider.   Interventions: 1:1 collaboration with [name], MD regarding development and update of comprehensive plan of care as  evidenced by provider attestation and co-signature Inter-disciplinary care team collaboration (see longitudinal plan of care) Comprehensive medication review performed; medication list  updated in electronic medical record  Hypertension (BP goal <140/90) -Not ideally controlled -Current treatment: Metoprolol tartrate 25 mg 1 tablet twice daily -Medications previously tried: none  -Current home readings: does not check at home -Current dietary habits: limits salt intake; cut down on spices and salt; uses pepper -Current exercise habits: not consistently -Denies hypotensive/hypertensive symptoms -Educated on BP goals and benefits of medications for prevention of heart attack, stroke and kidney damage; Exercise goal of 150 minutes per week; Importance of home blood pressure monitoring; Proper BP monitoring technique; -Counseled to monitor BP at home weekly, document, and provide log at future appointments -Counseled on diet and exercise extensively Recommended to continue current medication  Hyperlipidemia: (LDL goal < 100) -Controlled -Current treatment: Simvastatin 40 mg 1 tablet daily -Medications previously tried: none  -Current dietary patterns: no fried foods; more chicken and fish -Current exercise habits: not consistently -Educated on Cholesterol goals;  Benefits of statin for ASCVD risk reduction; Importance of limiting foods high in cholesterol; Exercise goal of 150 minutes per week; -Counseled on diet and exercise extensively Recommended to continue current medication Recommended repeat lipid panel.  Diabetes (A1c goal <7%) -Controlled -Current medications: Lantus 100 units/mL inject 40 BID daily Ozempic inject 0.25 mg once weekly - not taking Metformin 1000 mg  1 tablet twice daily with a meal -Medications previously tried: Janumet, Levemir, Byetta, Ozempic (cost)  -Current home glucose readings fasting glucose: 157 (highest), 90 or under post prandial glucose: uses CGM -Reports hypoglycemic/hyperglycemic symptoms -Current meal patterns:  breakfast: boost (glucose control), fruit  lunch: salad  dinner: meat with vegetables, naan snacks: not  too many sweets; small bag of chips drinks: water; soda in case of emergencies, coke zero -Current exercise: goes up and down the stairs; doesn't do more than 10 minutes -Educated on A1c and blood sugar goals; Prevention and management of hypoglycemic episodes; Continuous glucose monitoring; Carbohydrate counting and/or plate method -Counseled to check feet daily and get yearly eye exams -Counseled on diet and exercise extensively Recommended to continue current medication Recommended taking metformin after meals to minimize stomach upset Provided healthy plate handout and hypoglycemia handout.  Asthma (Goal: control symptoms) -Controlled -Current treatment  Albuterol HFA as needed -Medications previously tried: none  -Pulmonary function testing: n/a -Exacerbations requiring treatment in last 6 months: none -Patient denies consistent use of maintenance inhaler -Frequency of rescue inhaler use: not often -Counseled on When to use rescue inhaler -Recommended to continue current medication  GERD (Goal: minimize symptoms of acid reflux) -Controlled -Current treatment  Famotidine 20 mg 1 tablet twice daily as needed Esomeprazole 40 mg 1 capsule twice daily x 4 weeks then 1 capsule daily -Medications previously tried: nine  -Recommended to continue current medication Counseled on non-pharmacologic management of symptoms such as elevating the head of your bed, avoiding eating 2-3 hours before bed, avoiding triggering foods such as acidic, spicy, or fatty foods, eating smaller meals, and wearing clothes that are loose around the waist  Allergic rhinitis (Goal: minimize symptoms) -Controlled -Current treatment  Fluticasone 50 mcg/act 1-2 sprays daily as needed -Medications previously tried: none  -Recommended to continue current medication  Hypothyroidism (Goal: 0.35-4.5) -Controlled -Current treatment  Synthroid 75 mcg 1 tablet daily -Medications previously tried: none   -Recommended repeat TSH.  TMJ (Goal: minimize pain) -Controlled -Current treatment  Cyclobenzaprine 10 mg 1 tablet daily as needed -Medications previously  tried: none  -Collaborated with PCP to update rx to three times daily as previously prescribed.  Migraines (Goal: minimize severity of migraines) -Not ideally controlled -Current treatment  Ibuprofen 400 mg taking daily  -Medications previously tried: Excedrin  -Recommended medication for migraine prevention.   Health Maintenance -Vaccine gaps: 2nd dose of shingrix, COVID booster, Pneumovax, tetanus -Current therapy:  Pataday 0.2% solution as needed Hydrocortisone 0.2% ointment apply twice daily Ibuprofen 200 mg as needed -Educated on Cost vs benefit of each product must be carefully weighed by individual consumer -Patient is satisfied with current therapy and denies issues -Recommended to continue current medication Counseled on risks for bleeding with NSAID use  Patient Goals/Self-Care Activities Patient will:  - take medications as prescribed check glucose with CGM, document, and provide at future appointments check blood pressure weekly, document, and provide at future appointments target a minimum of 150 minutes of moderate intensity exercise weekly  Follow Up Plan: The care management team will reach out to the patient again over the next 30 days.        Compliance/Adherence/Medication fill history: Care Gaps: Foot exam, eye exam, tetanus, COVID booster, 2nd dose of shingrix, pneumovax, urine microalbumin, PAP smear, influenza vaccine  Star-Rating Drugs: Simvastatin 29m - [facility] Semaglitude (Ozempic) - Patient receives samples from Dr when she visits Metformin 1000 mg - [facility]  [id number] [address] [place] [address]Phone: [phone]: [phone] Was having issues with insulin   Uses pill box? No - doesn't need   Pt endorses 99% compliance  Husband uses pillbox  We discussed: Current pharmacy is preferred with insurance plan and patient is satisfied with pharmacy services Patient decided to: Continue current medication management strategy  Care Plan and Follow Up Patient Decision:  Patient agrees to Care Plan and Follow-up.  Plan: The care management team will reach out to the patient again over the next 30 days.  MJeni Salles PharmD, BWaldorfPharmacist [place]

## 2021-01-11 ENCOUNTER — Other Ambulatory Visit: Payer: Self-pay | Admitting: Family Medicine

## 2021-01-11 DIAGNOSIS — E2839 Other primary ovarian failure: Secondary | ICD-10-CM

## 2021-01-12 NOTE — Patient Instructions (Signed)
Hi Terri Gray,  It was great to get to meet you in person! Below is a summary of some of the topics we discussed.   Please reach out to me if you have any questions or need anything before our follow up!  Best, Maddie  Jeni Salles, PharmD, [facility]   Visit Information   Goals Addressed             This Visit's Progress    Track and Manage My Blood Pressure-Hypertension       Timeframe:  Short-Term Goal Priority:  Medium Start Date:                             Expected End Date:                       Follow Up Date 04/13/21   - check blood pressure weekly - choose a place to take my blood pressure (home, clinic or office, retail store) - write blood pressure results in a log or diary    Why is this important?   You won't feel high blood pressure, but it can still hurt your blood vessels.  High blood pressure can cause heart or kidney problems. It can also cause a stroke.  Making lifestyle changes like losing a little weight or eating less salt will help.  Checking your blood pressure at home and at different times of the day can help to control blood pressure.  If the doctor prescribes medicine remember to take it the way the doctor ordered.  Call the office if you cannot afford the medicine or if there are questions about it.     Notes:        Patient Care Plan: CCM Pharmacy Care Plan     Problem Identified: Problem: Hyperlipidemia, Diabetes, GERD, Asthma, Hypothyroidism, Osteoarthritis, and Allergic Rhinitis      Long-Range Goal: Patient-Specific Goal   Start Date: 12/21/2020  Expected End Date: 12/21/2021  This Visit's Progress: On track  Priority: High  Note:   Current Barriers:  Unable to independently monitor therapeutic efficacy Unable to achieve control of migraines   Pharmacist Clinical Goal(s):  Patient will achieve adherence to monitoring guidelines and medication adherence to achieve  therapeutic efficacy through collaboration with PharmD and provider.   Interventions: 1:1 collaboration with [name], MD regarding development and update of comprehensive plan of care as evidenced by provider attestation and co-signature Inter-disciplinary care team collaboration (see longitudinal plan of care) Comprehensive medication review performed; medication list updated in electronic medical record  Hypertension (BP goal <140/90) -Not ideally controlled -Current treatment: Metoprolol tartrate 25 mg 1 tablet twice daily -Medications previously tried: none  -Current home readings: does not check at home -Current dietary habits: limits salt intake; cut down on spices and salt; uses pepper -Current exercise habits: not consistently -Denies hypotensive/hypertensive symptoms -Educated on BP goals and benefits of medications for prevention of heart attack, stroke and kidney damage; Exercise goal of 150 minutes per week; Importance of home blood pressure monitoring; Proper BP monitoring technique; -Counseled to monitor BP at home weekly, document, and provide log at future appointments -Counseled on diet and exercise extensively Recommended to continue current medication  Hyperlipidemia: (LDL goal < 100) -Controlled -Current treatment: Simvastatin 40 mg 1 tablet daily -Medications previously tried: none  -Current dietary patterns: no fried foods; more chicken and fish -Current exercise habits:  not consistently -Educated on Cholesterol goals;  Benefits of statin for ASCVD risk reduction; Importance of limiting foods high in cholesterol; Exercise goal of 150 minutes per week; -Counseled on diet and exercise extensively Recommended to continue current medication Recommended repeat lipid panel.  Diabetes (A1c goal <7%) -Controlled -Current medications: Lantus 100 units/mL inject 40 BID daily Ozempic inject 0.25 mg once weekly - not taking Metformin 1000 mg  1 tablet  twice daily with a meal -Medications previously tried: Janumet, Levemir, Byetta, Ozempic (cost)  -Current home glucose readings fasting glucose: 157 (highest), 90 or under post prandial glucose: uses CGM -Reports hypoglycemic/hyperglycemic symptoms -Current meal patterns:  breakfast: boost (glucose control), fruit  lunch: salad  dinner: meat with vegetables, naan snacks: not too many sweets; small bag of chips drinks: water; soda in case of emergencies, coke zero -Current exercise: goes up and down the stairs; doesn't do more than 10 minutes -Educated on A1c and blood sugar goals; Prevention and management of hypoglycemic episodes; Continuous glucose monitoring; Carbohydrate counting and/or plate method -Counseled to check feet daily and get yearly eye exams -Counseled on diet and exercise extensively Recommended to continue current medication Recommended taking metformin after meals to minimize stomach upset Provided healthy plate handout and hypoglycemia handout.  Asthma (Goal: control symptoms) -Controlled -Current treatment  Albuterol HFA as needed -Medications previously tried: none  -Pulmonary function testing: n/a -Exacerbations requiring treatment in last 6 months: none -Patient denies consistent use of maintenance inhaler -Frequency of rescue inhaler use: not often -Counseled on When to use rescue inhaler -Recommended to continue current medication  GERD (Goal: minimize symptoms of acid reflux) -Controlled -Current treatment  Famotidine 20 mg 1 tablet twice daily as needed Esomeprazole 40 mg 1 capsule twice daily x 4 weeks then 1 capsule daily -Medications previously tried: nine  -Recommended to continue current medication Counseled on non-pharmacologic management of symptoms such as elevating the head of your bed, avoiding eating 2-3 hours before bed, avoiding triggering foods such as acidic, spicy, or fatty foods, eating smaller meals, and wearing clothes that are  loose around the waist  Allergic rhinitis (Goal: minimize symptoms) -Controlled -Current treatment  Fluticasone 50 mcg/act 1-2 sprays daily as needed -Medications previously tried: none  -Recommended to continue current medication  Hypothyroidism (Goal: 0.35-4.5) -Controlled -Current treatment  Synthroid 75 mcg 1 tablet daily -Medications previously tried: none  -Recommended repeat TSH.  TMJ (Goal: minimize pain) -Controlled -Current treatment  Cyclobenzaprine 10 mg 1 tablet daily as needed -Medications previously tried: none  -Collaborated with PCP to update rx to three times daily as previously prescribed.  Migraines (Goal: minimize severity of migraines) -Not ideally controlled -Current treatment  Ibuprofen 400 mg taking daily  -Medications previously tried: Excedrin  -Recommended medication for migraine prevention.   Health Maintenance -Vaccine gaps: 2nd dose of shingrix, COVID booster, Pneumovax, tetanus -Current therapy:  Pataday 0.2% solution as needed Hydrocortisone 0.2% ointment apply twice daily Ibuprofen 200 mg as needed -Educated on Cost vs benefit of each product must be carefully weighed by individual consumer -Patient is satisfied with current therapy and denies issues -Recommended to continue current medication Counseled on risks for bleeding with NSAID use  Patient Goals/Self-Care Activities Patient will:  - take medications as prescribed check glucose with CGM, document, and provide at future appointments check blood pressure weekly, document, and provide at future appointments target a minimum of 150 minutes of moderate intensity exercise weekly  Follow Up Plan: The care management team will reach out to the patient  again over the next 30 days.       Terri Gray was given information about Chronic Care Management services today including:  CCM service includes personalized support from designated clinical staff supervised by her physician,  including individualized plan of care and coordination with other care providers 24/7 contact phone numbers for assistance for urgent and routine care needs. Standard insurance, coinsurance, copays and deductibles apply for chronic care management only during months in which we provide at least 20 minutes of these services. Most insurances cover these services at 100%, however patients may be responsible for any copay, coinsurance and/or deductible if applicable. This service may help you avoid the need for more expensive face-to-face services. Only one practitioner may furnish and bill the service in a calendar month. The patient may stop CCM services at any time (effective at the end of the month) by phone call to the office staff.  Patient agreed to services and verbal consent obtained.   Patient verbalizes understanding of instructions provided today and agrees to view in [place].  The pharmacy team will reach out to the patient again over the next 30 days.   [name], [facility]

## 2021-01-13 ENCOUNTER — Other Ambulatory Visit: Payer: Self-pay | Admitting: Family Medicine

## 2021-01-13 DIAGNOSIS — M26609 Unspecified temporomandibular joint disorder, unspecified side: Secondary | ICD-10-CM

## 2021-01-13 MED ORDER — CYCLOBENZAPRINE HCL 10 MG PO TABS: 10.0000 mg | ORAL_TABLET | Freq: Three times a day (TID) | ORAL | 3 refills | Status: DC | PRN

## 2021-01-14 ENCOUNTER — Ambulatory Visit
Admission: RE | Admit: 2021-01-14 | Discharge: 2021-01-14 | Disposition: A | Discharge status: Home / Self Care | Payer: Medicare Other | Source: Ambulatory Visit | Attending: Family Medicine | Admitting: Family Medicine

## 2021-01-14 ENCOUNTER — Other Ambulatory Visit: Payer: Self-pay

## 2021-01-14 DIAGNOSIS — Z1231 Encounter for screening mammogram for malignant neoplasm of breast: Secondary | ICD-10-CM

## 2021-01-14 DIAGNOSIS — Z780 Asymptomatic menopausal state: Secondary | ICD-10-CM | POA: Diagnosis not present

## 2021-01-14 DIAGNOSIS — E2839 Other primary ovarian failure: Secondary | ICD-10-CM

## 2021-01-25 DIAGNOSIS — E039 Hypothyroidism, unspecified: Secondary | ICD-10-CM | POA: Diagnosis not present

## 2021-01-25 DIAGNOSIS — E1142 Type 2 diabetes mellitus with diabetic polyneuropathy: Secondary | ICD-10-CM | POA: Diagnosis not present

## 2021-01-25 DIAGNOSIS — E782 Mixed hyperlipidemia: Secondary | ICD-10-CM | POA: Diagnosis not present

## 2021-01-25 DIAGNOSIS — Z7984 Long term (current) use of oral hypoglycemic drugs: Secondary | ICD-10-CM | POA: Diagnosis not present

## 2021-01-25 DIAGNOSIS — Z794 Long term (current) use of insulin: Secondary | ICD-10-CM | POA: Diagnosis not present

## 2021-01-25 DIAGNOSIS — Z833 Family history of diabetes mellitus: Secondary | ICD-10-CM | POA: Diagnosis not present

## 2021-01-25 DIAGNOSIS — I10 Essential (primary) hypertension: Secondary | ICD-10-CM | POA: Diagnosis not present

## 2021-01-27 ENCOUNTER — Telehealth: Payer: Self-pay | Admitting: Pharmacist

## 2021-01-27 NOTE — Chronic Care Management (AMB) (Signed)
Chronic Care Management Pharmacy Assistant   Name: Terri Gray  MRN: [id number] DOB: Nov 18, 1955   Reason for Encounter: General Assessment Call    Conditions to be addressed/monitored: HTN, HLD, and DMII  Recent office Gray:  None  Recent consult Gray:  01-14-2021 Terri Gray - Patient presented for Terri Gray:  None in previous 6 months  Medications: Outpatient Encounter Medications as of 01/27/2021  Medication Sig Note   albuterol (VENTOLIN HFA) 108 (90 Base) MCG/ACT inhaler Inhale 2 puffs into the lungs every 6 (six) hours as needed for wheezing or shortness of breath. (Patient not taking: Reported on 12/21/2020)    cyclobenzaprine (FLEXERIL) 10 MG tablet Take 1 tablet (10 mg total) by mouth 3 (three) times daily as needed (TMJ pain). for muscle spams    esomeprazole (NEXIUM) 40 MG capsule Take 1 capsule (40 mg total) by mouth daily. Take 40 mg twice a day for 4 weeks. Then 1 daily thereafter.    famotidine (PEPCID) 20 MG tablet Take 1 tablet (20 mg total) by mouth 2 (two) times daily as needed. (Patient not taking: No sig reported)    fluticasone (FLONASE) 50 MCG/ACT nasal spray Place 1-2 sprays into both nostrils daily as needed.    glucose blood (TRUE METRIX BLOOD GLUCOSE TEST) test strip 1 each by Other route 4 (four) times daily as needed for other. And lancets 2/day    hydrocortisone valerate ointment (WESTCORT) 0.2 % Apply 1 application topically 2 (two) times daily.    ibuprofen (ADVIL) 200 MG tablet Take 200 mg by mouth every 8 (eight) hours as needed.    LANTUS SOLOSTAR 100 UNIT/ML Solostar Pen INJECT 85 UNITS SUBCUTANEOUSLY ONCE DAILY IN THE MORNING 07/27/2020: Take 45 units     levothyroxine (SYNTHROID) 75 MCG tablet Take 1 tablet (75 mcg total) by mouth daily before breakfast.    metFORMIN (GLUCOPHAGE) 1000 MG tablet Take 1 tablet (1,000 mg total) by mouth 2 (two) times daily with a meal.    metoprolol tartrate (LOPRESSOR) 25  MG tablet Take 1 tablet (25 mg total) by mouth 2 (two) times daily.    Olopatadine HCl (PATADAY) 0.2 % SOLN Place 1 drop into both eyes daily as needed.    Semaglutide,0.25 or 0.5MG/DOS, 2 MG/1.5ML SOPN Inject into the skin. (Patient not taking: Reported on 12/21/2020)    simvastatin (ZOCOR) 40 MG tablet Take 1 tablet (40 mg total) by mouth daily.    No facility-administered encounter medications on file as of 01/27/2021.  Reviewed chart prior to disease state call. Spoke with patient regarding BP  Recent Office Vitals: BP Readings from Last 3 Encounters:  05/12/20 138/84  03/10/20 (!) 132/94  02/09/20 (!) 142/72   Pulse Readings from Last 3 Encounters:  03/10/20 81  02/09/20 76  02/06/20 77    Wt Readings from Last 3 Encounters:  05/12/20 211 lb (95.7 kg)  03/10/20 215 lb (97.5 kg)  02/09/20 215 lb (97.5 kg)     Kidney Function Lab Results  Component Value Date/Time   CREATININE 0.78 02/05/2020 08:48 AM   CREATININE 0.82 12/03/2019 09:34 AM   CREATININE 0.85 03/11/2019 07:57 AM   GFR 81.47 03/11/2019 07:57 AM   GFRNONAA 81 02/05/2020 08:48 AM   GFRAA 93 02/05/2020 08:48 AM    BMP Latest Ref Rng & Units 02/05/2020 12/03/2019 03/11/2019  Glucose 65 - 99 mg/dL 169(H) 116(H) 105(H)  BUN 8 - 27 mg/dL 9 8 11   Creatinine 0.57 - 1.00  mg/dL 0.78 0.82 0.85  BUN/Creat Ratio 12 - 28 12 NOT APPLICABLE -  Sodium 127 - 144 mmol/L 137 139 139  Potassium 3.5 - 5.2 mmol/L 4.6 4.5 4.4  Chloride 96 - 106 mmol/L 101 102 103  CO2 20 - 29 mmol/L 21 26 29   Calcium 8.7 - 10.3 mg/dL 9.2 9.2 9.0    Current antihypertensive regimen:  Metoprolol tartrate 25 mg 1 tablet twice daily How often are you checking your Blood Pressure? Patient reports she is checking weekly Current home BP readings: 124/70 patient reports this is the typical range for her. What recent interventions/DTPs have been made by any provider to improve Blood Pressure control since last CPP Visit: Patient reports none Any recent  hospitalizations or ED Gray since last visit with CPP? No  Adherence Gray: Is the patient currently on ACE/ARB medication? No Does the patient have >5 day gap between last estimated fill dates? Yes  Recent Relevant Labs: Lab Results  Component Value Date/Time   HGBA1C 6.8 (H) 12/03/2019 09:34 AM   HGBA1C 6.8 (H) 03/11/2019 07:57 AM    Kidney Function Lab Results  Component Value Date/Time   CREATININE 0.78 02/05/2020 08:48 AM   CREATININE 0.82 12/03/2019 09:34 AM   CREATININE 0.85 03/11/2019 07:57 AM   GFR 81.47 03/11/2019 07:57 AM   GFRNONAA 81 02/05/2020 08:48 AM   GFRAA 93 02/05/2020 08:48 AM    Current antihyperglycemic regimen:  Lantus 100 units/mL inject 40 BID daily Ozempic inject 0.25 mg once weekly - not taking Metformin 1000 mg  1 tablet twice daily with a meal What recent interventions/DTPs have been made to improve glycemic control:  Patient reports none Have there been any recent hospitalizations or ED Gray since last visit with CPP? No Patient denies hypoglycemic symptoms, including Pale, Sweaty, Shaky, Hungry, Nervous/irritable, and Vision changes Patient denies hyperglycemic symptoms, including blurry vision, excessive thirst, fatigue, polyuria, and weakness How often are you checking your blood sugar? Patient reports she is checking daily What are your blood sugars ranging? Patient reports her recent A1C of 6.2, she reports her sugars are typically right under 100 on today was 99 During the week, how often does your blood glucose drop below 70? Patient reports it has been in the 60's before but not recently.  Adherence Gray: Is the patient currently on a STATIN medication? Yes Is the patient currently on ACE/ARB medication? No Does the patient have >5 day gap between last estimated fill dates? Yes Patient reports for her Metformin she will call Terri Gray office today  as she wants a new prescription called into a different pharmacy shed like to get it  from Terri Gray   01/27/2021 Name: Terri Gray MRN: [id number] DOB: 12/03/1955 Terri Gray is a 65 y.o. year old female who is a primary care patient of Terri Ruddy, MD.  Terri Gray performed; Spoke to patient regarding cholesterol  Lipid Panel    Component Value Date/Time   CHOL 158 12/03/2019 0934   TRIG 191 (H) 12/03/2019 0934   HDL 44 (L) 12/03/2019 0934   Vale 85 12/03/2019 0934    10-year ASCVD risk score: The 10-year ASCVD risk score (Arnett DK, et al., 2019) is: 16.9%   Values used to calculate the score:     Age: 65 years     Sex: Female     Is Non-Hispanic African American: No     Diabetic: Yes     Tobacco smoker: No     Systolic  Blood Pressure: 144 mmHg     Is BP treated: Yes     HDL Cholesterol: 44 mg/dL     Total Cholesterol: 158 mg/dL  Current antihyperlipidemic regimen:  Simvastatin 40 mg 1 tablet daily Previous antihyperlipidemic medications tried: None ASCVD risk enhancing conditions: DM and HTN What recent interventions/DTPs have been made by any provider to improve Cholesterol control since last CPP Visit: Patient reports none Any recent hospitalizations or ED Gray since last visit with CPP? No  Adherence Gray: Does the patient have >5 day gap between last estimated fill dates? Yes  Care Gaps: Urine Micro - Overdue HIV Screening - Overdue TDAP - Overdue PAP Smear - Overdue Foot EXAM - Overdue Zoster Vaccines - Overdue PNA Vaccines - Overdue HGB A1C - Overdue Flu Vaccine - Overdue AWV - Scheduled for 08-02-21 CCM - 04-26-2021  Star Rating Drugs: Simvastatin (Zocor) 40 mg  - Last filled 09-17-2020 90 DS (Call to [facility] states last fill 01-07-21 90 DS) Metformin (Glucophage) 1000 mg - Last filled 08-28-2020 90 DS at [facility] (Verified accurate) Semaglutide (Patient no longer taking)   [place] Pharmacist Assistant [phone]

## 2021-01-28 ENCOUNTER — Other Ambulatory Visit: Payer: Self-pay

## 2021-01-28 ENCOUNTER — Ambulatory Visit: Payer: Medicare Other | Admitting: Emergency Medicine

## 2021-01-28 ENCOUNTER — Encounter: Payer: Self-pay | Admitting: Emergency Medicine

## 2021-01-28 DIAGNOSIS — J3089 Other allergic rhinitis: Secondary | ICD-10-CM | POA: Diagnosis not present

## 2021-01-28 DIAGNOSIS — K219 Gastro-esophageal reflux disease without esophagitis: Secondary | ICD-10-CM

## 2021-01-28 DIAGNOSIS — J4540 Moderate persistent asthma, uncomplicated: Secondary | ICD-10-CM

## 2021-01-28 NOTE — Assessment & Plan Note (Signed)
Seems to have good control on Nexium once daily although she does have morning upper airway irritation.  Question whether we may need to increase to twice daily in the future.  Recommend sleeping with the head of her bed elevated, GERD diet, not eating after 8 PM

## 2021-01-28 NOTE — Assessment & Plan Note (Signed)
Unclear severity.  She is not currently on scheduled bronchodilator therapy, never uses her albuterol.  Suspect that this is mild and intermittent.  Most of her current symptoms sound like upper airway irritation.  We will go ahead and get her pulmonary function testing to quantify her degree of obstruction.

## 2021-01-28 NOTE — Progress Notes (Signed)
Subjective:    Patient ID: Terri Gray, female    DOB: 1956-04-17, 65 y.o.   MRN: [id number]  HPI 65 year old never smoker with diabetes, GERD, hypertension, IBS, allergic rhinitis, OSA on CPAP as well as a dental device which she uses occasionally as well.  She carries a history of childhood and then adult asthma that was made in the 80's.  She has had spirometry 02/03/2020 which I reviewed, did not show any evidence or clear obstruction, did suggest restriction.  Full PFT not available. Chest x-ray done on 12/15/2019 reviewed by me, shows no infiltrates. Allergy eval >> trees, grasses  She describes new nasal drainage, and congestion, rash on her arms, legs, back beginning in March. ? Related to getting her COVID vaccines in February. Has difficulty getting air through her nose. She is clearing clear mucous from her chest for spells of says, then sx free for several days. No real wheeze. She is still dealing with the rash - dark patches, sometimes papules. She uses flonase, not every day. Was treated with prednisone in April - slight improvement while on it. Started on Symbicort at that time. Has her albuterol - has only used a few times. She has GERD daily, breakthrough sx on protonix qd. She had better results on dexilant. She was just started on xyzal and pepcid by Dr Verlin Fester.    ROV 01/28/21 --65 year old woman, annual follow-up for history of allergic rhinitis, asthma, OSA on CPAP.  She also has a history of GERD.  I saw her 1 year ago at which time I tried stopping Symbicort.  Also recommended that she continue Xyzal, start taking Flonase every day, continue Dexilant and Pepcid.  We had discussed getting PFTs but this was not done.  She has been dealing with chest congestion in the am when she wakes up. She coughs but non-productive. She has albuterol but never uses it. She hears some wheeze. ? Whether this is UA noise. She has intermittent aspiration with food or water. She had endoscopy,  swallowing eval that was unrevealing. She describes a lot of intermittent nasal congestion, uses flonase prn. Dexilant changed to nexium, no breakthrough GERD.   She is also having a papular rash on her arms and neck, unclear exposure related. It is pruritic. Light erythema. She had negative skin testing last year. She saw dermatology, has been given steroids cream, has worked some.    Review of Systems As per HPI  Past Medical History:  Diagnosis Date   Allergy    Arthritis    Asthma    Chicken pox    Colon polyps    Diabetes mellitus without complication (HCC)    GERD (gastroesophageal reflux disease)    H/O fracture of nose    Broken by ENT   History of colon polyps    Hyperlipidemia    Hypertension    IBS (irritable bowel syndrome)    Migraines    Obesity    Shingles 2012   Sleep apnea    on CPAP machine but uses it 90% of the time because it gives her some complication and does a mouth guard as well   Thyroid disease      Family History  Problem Relation Age of Onset   Bronchitis Mother    Ovarian cancer Mother 52   GER disease Mother    Colon polyps Mother    Irritable bowel syndrome Mother    Diabetes Father    Kidney disease Father    Heart  failure Father    Irritable bowel syndrome Father    Diabetes Sister    Asthma Sister    Urticaria Sister    Diabetes Sister    Allergic rhinitis Daughter    GER disease Daughter    Irritable bowel syndrome Daughter    Irritable bowel syndrome Daughter    Diabetes Brother    Allergic rhinitis Son    Allergic rhinitis Grandchild    Food Allergy Grandchild    Immunodeficiency Neg Hx    Angioedema Neg Hx    Breast cancer Neg Hx      Social History   Socioeconomic History   Marital status: Married    Spouse name: Not on file   Number of children: 3   Years of education: 14   Highest education level: Associate degree: occupational, Hotel manager, or vocational program  Occupational History   Not on file  Tobacco  Use   Smoking status: Never   Smokeless tobacco: Never  Vaping Use   Vaping Use: Never used  Substance and Sexual Activity   Alcohol use: No    Comment: last drink was over 30 years ago   Drug use: No   Sexual activity: Not on file  Other Topics Concern   Not on file  Social History Narrative   HH 3    Married   Son lives with them   1 dog   Enjoys reading, tv, Teaching laboratory technician   Social Determinants of Health   Financial Resource Strain: Low Risk    Difficulty of Paying Living Expenses: Not hard at all  Food Insecurity: No Food Insecurity   Worried About Charity fundraiser in the Last Year: Never true   Arboriculturist in the Last Year: Never true  Transportation Needs: No Transportation Needs   Lack of Transportation (Medical): No   Lack of Transportation (Non-Medical): No  Physical Activity: Sufficiently Active   Days of Exercise per Week: 7 days   Minutes of Exercise per Session: 30 min  Stress: No Stress Concern Present   Feeling of Stress : Not at all  Social Connections: Moderately Isolated   Frequency of Communication with Friends and Family: More than three times a week   Frequency of Social Gatherings with Friends and Family: Three times a week   Attends Religious Services: Never   Active Member of Clubs or Organizations: No   Attends Archivist Meetings: Never   Marital Status: Married  Human resources officer Violence: Not At Risk   Fear of Current or Ex-Partner: No   Emotionally Abused: No   Physically Abused: No   Sexually Abused: No    Grew up in [place], has lived in [place], [place], [place]  Allergies  Allergen Reactions   Gabapentin     Stomach ache     Outpatient Medications Prior to Visit  Medication Sig Dispense Refill   albuterol (VENTOLIN HFA) 108 (90 Base) MCG/ACT inhaler Inhale 2 puffs into the lungs every 6 (six) hours as needed for wheezing or shortness of breath. 18 g 1   cyclobenzaprine (FLEXERIL) 10 MG tablet Take 1 tablet (10 mg total) by mouth 3  (three) times daily as needed (TMJ pain). for muscle spams 90 tablet 3   esomeprazole (NEXIUM) 40 MG capsule Take 1 capsule (40 mg total) by mouth daily. Take 40 mg twice a day for 4 weeks. Then 1 daily thereafter. 90 capsule 3   fluticasone (FLONASE) 50 MCG/ACT nasal spray Place 1-2 sprays into both  nostrils daily as needed. 16 g 5   glucose blood (TRUE METRIX BLOOD GLUCOSE TEST) test strip 1 each by Other route 4 (four) times daily as needed for other. And lancets 2/day 360 each 3   hydrocortisone valerate ointment (WESTCORT) 0.2 % Apply 1 application topically 2 (two) times daily. 60 g 1   ibuprofen (ADVIL) 200 MG tablet Take 200 mg by mouth every 8 (eight) hours as needed.     LANTUS SOLOSTAR 100 UNIT/ML Solostar Pen INJECT 85 UNITS SUBCUTANEOUSLY ONCE DAILY IN THE MORNING 75 mL 0   levothyroxine (SYNTHROID) 75 MCG tablet Take 1 tablet (75 mcg total) by mouth daily before breakfast. 90 tablet 3   metFORMIN (GLUCOPHAGE) 1000 MG tablet Take 1 tablet (1,000 mg total) by mouth 2 (two) times daily with a meal. 180 tablet 3   metoprolol tartrate (LOPRESSOR) 25 MG tablet Take 1 tablet (25 mg total) by mouth 2 (two) times daily. 180 tablet 3   Olopatadine HCl (PATADAY) 0.2 % SOLN Place 1 drop into both eyes daily as needed. 2.5 mL 5   Semaglutide,0.25 or 0.5MG/DOS, 2 MG/1.5ML SOPN Inject into the skin.     simvastatin (ZOCOR) 40 MG tablet Take 1 tablet (40 mg total) by mouth daily. 90 tablet 3   famotidine (PEPCID) 20 MG tablet Take 1 tablet (20 mg total) by mouth 2 (two) times daily as needed. (Patient not taking: Reported on 01/28/2021) 60 tablet 5   No facility-administered medications prior to visit.        Objective:   Physical Exam Vitals:   01/28/21 1544  BP: 124/76  Pulse: 72  Temp: 98.3 F (36.8 C)  TempSrc: Oral  SpO2: 98%  Weight: 207 lb (93.9 kg)  Height: 5' 6"  (1.676 m)   Gen: Pleasant, overwt woman, in no distress,  normal affect  ENT: No lesions,  mouth clear,  oropharynx  clear, nasal congestion and obstruction  Neck: No JVD, no stridor today  Lungs: No use of accessory muscles, no crackles or wheezing on normal respiration, no wheeze on forced expiration  Cardiovascular: RRR, heart sounds normal, no murmur or gallops, no peripheral edema  Musculoskeletal: No deformities, no cyanosis or clubbing  Neuro: alert, awake, non focal  Skin: Warm, no lesions or rash      Assessment & Plan:  Moderate persistent asthma Unclear severity.  She is not currently on scheduled bronchodilator therapy, never uses her albuterol.  Suspect that this is mild and intermittent.  Most of her current symptoms sound like upper airway irritation.  We will go ahead and get her pulmonary function testing to quantify her degree of obstruction.  Seasonal and perennial allergic rhinitis Currently on fluticasone nasal spray as needed.  Discussed with her increasing this to daily on a schedule given her persistent upper airway irritation, symptoms every morning when she wakes up.  GERD (gastroesophageal reflux disease) Seems to have good control on Nexium once daily although she does have morning upper airway irritation.  Question whether we may need to increase to twice daily in the future.  Recommend sleeping with the head of her bed elevated, GERD diet, not eating after 8 PM  Baltazar Apo, MD, PhD 01/28/2021, 4:18 PM [facility] [phone] or if no answer [phone]

## 2021-01-28 NOTE — Assessment & Plan Note (Signed)
Currently on fluticasone nasal spray as needed.  Discussed with her increasing this to daily on a schedule given her persistent upper airway irritation, symptoms every morning when she wakes up.

## 2021-01-28 NOTE — Patient Instructions (Addendum)
We will arrange for pulmonary function testing in next office visit. Please continue your Nexium daily as you have been taking it. Please continue to use your fluticasone nasal spray, 2 sprays each nostril if you need it for nasal congestion.  Depending on how your cough and throat irritation are doing there may be some benefit to taking this medicine every day on a schedule. Follow with Dr. Lamonte Sakai next available with full pulmonary function testing on the same day.

## 2021-03-22 ENCOUNTER — Ambulatory Visit: Payer: Medicare Other | Admitting: Emergency Medicine

## 2021-03-31 ENCOUNTER — Encounter: Payer: Self-pay | Admitting: Family Medicine

## 2021-03-31 ENCOUNTER — Ambulatory Visit (INDEPENDENT_AMBULATORY_CARE_PROVIDER_SITE_OTHER): Payer: Medicare Other | Admitting: Family Medicine

## 2021-03-31 VITALS — BP 140/88 | HR 69 | Temp 98.3°F | Wt 201.8 lb

## 2021-03-31 DIAGNOSIS — L500 Allergic urticaria: Secondary | ICD-10-CM | POA: Diagnosis not present

## 2021-03-31 DIAGNOSIS — M25561 Pain in right knee: Secondary | ICD-10-CM | POA: Diagnosis not present

## 2021-03-31 DIAGNOSIS — G8929 Other chronic pain: Secondary | ICD-10-CM

## 2021-03-31 DIAGNOSIS — R0981 Nasal congestion: Secondary | ICD-10-CM | POA: Diagnosis not present

## 2021-03-31 DIAGNOSIS — E039 Hypothyroidism, unspecified: Secondary | ICD-10-CM | POA: Diagnosis not present

## 2021-03-31 DIAGNOSIS — Z794 Long term (current) use of insulin: Secondary | ICD-10-CM

## 2021-03-31 DIAGNOSIS — M5412 Radiculopathy, cervical region: Secondary | ICD-10-CM | POA: Diagnosis not present

## 2021-03-31 DIAGNOSIS — E1142 Type 2 diabetes mellitus with diabetic polyneuropathy: Secondary | ICD-10-CM

## 2021-03-31 DIAGNOSIS — K219 Gastro-esophageal reflux disease without esophagitis: Secondary | ICD-10-CM | POA: Diagnosis not present

## 2021-03-31 DIAGNOSIS — M1711 Unilateral primary osteoarthritis, right knee: Secondary | ICD-10-CM

## 2021-03-31 DIAGNOSIS — J4542 Moderate persistent asthma with status asthmaticus: Secondary | ICD-10-CM

## 2021-03-31 MED ORDER — ALBUTEROL SULFATE HFA 108 (90 BASE) MCG/ACT IN AERS: 2.0000 | INHALATION_SPRAY | Freq: Four times a day (QID) | RESPIRATORY_TRACT | 1 refills | Status: DC | PRN

## 2021-03-31 NOTE — Progress Notes (Signed)
Subjective:    Patient ID: Terri Gray, female    DOB: 1955/06/24, 65 y.o.   MRN: [id number]  Chief Complaint  Patient presents with   Knee Pain    Severe pain in rt knee, started a month ago. Got up and knee gave out, used icy hot patch, ice and heat, but will not completely go away. Hurts all around, front and back, takes a few seconds when getting up to put wt on it.    Numbness    Right pinky finger stays numb and goes up the arm.    Rash    Rash on chest under neck, test earlier this year showed infection. When it occurs has congestion, and when either rash or congestion gets worse the other gets worse.    HPI Patient was seen today for multiple concerns.  Pt with R knee pain worse in the last month.  Previously told had arthritis in R knee.  Pain worse with stairs.  Knee "gave out" one day.  Now with posterior R knee pain.  Seen by Ortho, h/o L TKR.  Tried icy hot, warm, cold.  Also with cramping in legs.  Pt with Rash on chest x 1 month.  Occurs at the same time she has nasal congestion with cough.  Seen by pulmonology.  Also notes an ongoing rattling/wheezing.  Having continued GERD symptoms.  Seen by GI.  On nexium 40 mg BID, then to switch to 40 mg daily.  States has no taste or smell, and has bubbles in her mouth all the time 2/2 reflux.  Dexillant worked well in the past, but insurance no longer covers it.  Pt with numbness in R 5th digit worse than other fingers. At times sleeps with arms above head to prevent the sensation from worsening.  Past Medical History:  Diagnosis Date   Allergy    Arthritis    Asthma    Chicken pox    Colon polyps    Diabetes mellitus without complication (HCC)    GERD (gastroesophageal reflux disease)    H/O fracture of nose    Broken by ENT   History of colon polyps    Hyperlipidemia    Hypertension    IBS (irritable bowel syndrome)    Migraines    Obesity    Shingles 2012   Sleep apnea    on CPAP machine but uses it 90% of the  time because it gives her some complication and does a mouth guard as well   Thyroid disease     Allergies  Allergen Reactions   Gabapentin     Stomach ache    ROS General: Denies fever, chills, night sweats, changes in weight, changes in appetite HEENT: Denies headaches, ear pain, changes in vision, rhinorrhea, sore throat CV: Denies CP, palpitations, SOB, orthopnea Pulm: Denies SOB, cough, wheezing  + cough, congestion, wheezing GI: Denies abdominal pain, nausea, vomiting, diarrhea, constipation  +bubbles in mouth/GERD GU: Denies dysuria, hematuria, frequency, vaginal discharge Msk: Denies muscle cramps, joint pains  + knee pain, cramping in legs Neuro: Denies weakness, numbness, tingling  + numbness in right fifth digit Skin: Denies rashes, bruising  + rash on chest. Psych: Denies depression, anxiety, hallucinations     Objective:    Blood pressure 140/88, pulse 69, temperature 98.3 F (36.8 C), temperature source Oral, weight 201 lb 12.8 oz (91.5 kg), SpO2 99 %.  Gen. Pleasant, well-nourished, in no distress, normal affect   HEENT: [place]/AT, face symmetric, conjunctiva clear, no  scleral icterus, PERRLA, EOMI, nares patent without drainage, pharynx without erythema or exudate. Neck: No JVD, no thyromegaly, no carotid bruits Lungs: no accessory muscle use, CTAB, no wheezes or rales Cardiovascular: RRR, no m/r/g, no peripheral edema Abdomen: BS present, soft, NT/ND. Musculoskeletal: TTP of lateral and medial R knee at joint line.  TTP at patellar tendon insertion. Fullness in posterior R knee.  No deformities, no cyanosis or clubbing, normal tone Neuro:  A&Ox3, CN II-XII intact, normal gait Skin:  Warm, dry, intact.  Urticaria on chest.   Wt Readings from Last 3 Encounters:  03/31/21 201 lb 12.8 oz (91.5 kg)  01/28/21 207 lb (93.9 kg)  05/12/20 211 lb (95.7 kg)    Lab Results  Component Value Date   WBC 6.8 02/05/2020   HGB 11.7 02/05/2020   HCT 37.6 02/05/2020   PLT  374 02/05/2020   GLUCOSE 169 (H) 02/05/2020   CHOL 158 12/03/2019   TRIG 191 (H) 12/03/2019   HDL 44 (L) 12/03/2019   LDLCALC 85 12/03/2019   ALT 16 02/05/2020   AST 25 02/05/2020   NA 137 02/05/2020   K 4.6 02/05/2020   CL 101 02/05/2020   CREATININE 0.78 02/05/2020   BUN 9 02/05/2020   CO2 21 02/05/2020   TSH 0.97 03/11/2019   HGBA1C 6.8 (H) 12/03/2019    Assessment/Plan:  Nasal congestion  -Likely 2/2 postnasal drainage from allergies vs viral etiology. -OTC antihistamine  -Also consider saline nasal rinse or flonase - Plan: CBC with Differential/Platelet  Arthritis of right knee -Concern for Baker's cyst -discussed supportive care including OTC topical analgesics, Tylenol arthritis strength -Continue follow-up with Ortho - Plan: DG Knee Complete 4 Views Right  Radiculopathy of cervical spine  -Supportive care including stretching and exercises -Consider PT - Plan: DG Cervical Spine Complete, CMP  Type 2 diabetes mellitus with diabetic polyneuropathy, with long-term current use of insulin (HCC)  Hemoglobin A1c 6.3% on 09/17/2020 -Continue lifestyle modifications -discussed foot and eye exam due. -continue f/u with Endo -continue current medications including metformin 1000 mg BID - Plan: Vitamin B12, Hemoglobin A1c  Allergic urticaria  -consider medication allergy, possibly PPI -OTC antihistamine recommended. - Plan: CBC with Differential/Platelet  Gastroesophageal reflux disease without esophagitis -continue current medication nexium 40 mg BID, then switching to daily. -continue f/u with GI -avoid foods known to cause symptoms  Acquired hypothyroidism  -continue synthroid 75 mcg daily -Continue follow-up with endocrinology - Plan: TSH  Moderate persistent asthma with status asthmaticus in adult -Stable -Continue follow-up with pulmonology - Plan: albuterol (VENTOLIN HFA) 108 (90 Base) MCG/ACT inhaler  Chronic R knee pain -concern for baker's cyst   and worsening arthritis -continue supportive care -will obtain imaging this visit -discussed f/u with Ortho -Plan: DG R knee  Per chart review it appear pt did not have imaging or labs done after visit.  F/u in 1 month, sooner if needed.  [name], MD

## 2021-04-01 ENCOUNTER — Other Ambulatory Visit (INDEPENDENT_AMBULATORY_CARE_PROVIDER_SITE_OTHER): Payer: Medicare Other

## 2021-04-01 ENCOUNTER — Other Ambulatory Visit: Payer: Self-pay

## 2021-04-01 ENCOUNTER — Ambulatory Visit (INDEPENDENT_AMBULATORY_CARE_PROVIDER_SITE_OTHER): Payer: Medicare Other

## 2021-04-01 DIAGNOSIS — Z794 Long term (current) use of insulin: Secondary | ICD-10-CM

## 2021-04-01 DIAGNOSIS — E1142 Type 2 diabetes mellitus with diabetic polyneuropathy: Secondary | ICD-10-CM | POA: Diagnosis not present

## 2021-04-01 DIAGNOSIS — M1711 Unilateral primary osteoarthritis, right knee: Secondary | ICD-10-CM | POA: Diagnosis not present

## 2021-04-01 DIAGNOSIS — M5412 Radiculopathy, cervical region: Secondary | ICD-10-CM | POA: Diagnosis not present

## 2021-04-01 DIAGNOSIS — M4722 Other spondylosis with radiculopathy, cervical region: Secondary | ICD-10-CM | POA: Diagnosis not present

## 2021-04-01 LAB — VITAMIN B12: Vitamin B-12: 206 pg/mL — ABNORMAL LOW (ref 211–911)

## 2021-04-01 LAB — CBC WITH DIFFERENTIAL/PLATELET
Basophils Absolute: 0 10*3/uL (ref 0.0–0.1)
Basophils Relative: 0.8 % (ref 0.0–3.0)
Eosinophils Absolute: 0.5 10*3/uL (ref 0.0–0.7)
Eosinophils Relative: 8.6 % — ABNORMAL HIGH (ref 0.0–5.0)
HCT: 36.1 % (ref 36.0–46.0)
Hemoglobin: 11.7 g/dL — ABNORMAL LOW (ref 12.0–15.0)
Lymphocytes Relative: 34.3 % (ref 12.0–46.0)
Lymphs Abs: 2.2 10*3/uL (ref 0.7–4.0)
MCHC: 32.5 g/dL (ref 30.0–36.0)
MCV: 76.9 fl — ABNORMAL LOW (ref 78.0–100.0)
Monocytes Absolute: 0.5 10*3/uL (ref 0.1–1.0)
Monocytes Relative: 8.4 % (ref 3.0–12.0)
Neutro Abs: 3 10*3/uL (ref 1.4–7.7)
Neutrophils Relative %: 47.9 % (ref 43.0–77.0)
Platelets: 355 10*3/uL (ref 150.0–400.0)
RBC: 4.69 Mil/uL (ref 3.87–5.11)
RDW: 15.9 % — ABNORMAL HIGH (ref 11.5–15.5)
WBC: 6.3 10*3/uL (ref 4.0–10.5)

## 2021-04-01 LAB — COMPREHENSIVE METABOLIC PANEL
ALT: 14 U/L (ref 0–35)
AST: 20 U/L (ref 0–37)
Albumin: 4.1 g/dL (ref 3.5–5.2)
Alkaline Phosphatase: 82 U/L (ref 39–117)
BUN: 10 mg/dL (ref 6–23)
CO2: 29 mEq/L (ref 19–32)
Calcium: 9.5 mg/dL (ref 8.4–10.5)
Chloride: 100 mEq/L (ref 96–112)
Creatinine, Ser: 0.87 mg/dL (ref 0.40–1.20)
GFR: 69.59 mL/min (ref >60.00)
Glucose, Bld: 107 mg/dL — ABNORMAL HIGH (ref 70–99)
Potassium: 4.8 mEq/L (ref 3.5–5.1)
Sodium: 136 mEq/L (ref 135–145)
Total Bilirubin: 0.6 mg/dL (ref 0.2–1.2)
Total Protein: 7.7 g/dL (ref 6.0–8.3)

## 2021-04-01 LAB — TSH: TSH: 0.67 u[IU]/mL (ref 0.35–5.50)

## 2021-04-01 LAB — HEMOGLOBIN A1C: Hgb A1c MFr Bld: 6.4 % (ref 4.6–6.5)

## 2021-04-20 ENCOUNTER — Encounter: Payer: Self-pay | Admitting: Family Medicine

## 2021-04-20 DIAGNOSIS — M25561 Pain in right knee: Secondary | ICD-10-CM

## 2021-04-20 DIAGNOSIS — M1711 Unilateral primary osteoarthritis, right knee: Secondary | ICD-10-CM

## 2021-04-26 ENCOUNTER — Telehealth: Payer: Medicare Other

## 2021-05-02 ENCOUNTER — Telehealth: Payer: Self-pay | Admitting: Pharmacist

## 2021-05-02 ENCOUNTER — Encounter: Payer: Self-pay | Admitting: Orthopedic Surgery

## 2021-05-02 ENCOUNTER — Ambulatory Visit: Payer: Medicare Other | Admitting: Orthopedic Surgery

## 2021-05-02 ENCOUNTER — Telehealth: Payer: Medicare Other

## 2021-05-02 ENCOUNTER — Other Ambulatory Visit: Payer: Self-pay

## 2021-05-02 DIAGNOSIS — M4722 Other spondylosis with radiculopathy, cervical region: Secondary | ICD-10-CM

## 2021-05-02 DIAGNOSIS — M1711 Unilateral primary osteoarthritis, right knee: Secondary | ICD-10-CM
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
## 2021-05-02 NOTE — Chronic Care Management (AMB) (Addendum)
    Chronic Care Management Pharmacy Assistant   Name: Terri Gray  MRN: [id number] DOB: 26-Oct-1955  05/02/21 APPOINTMENT REMINDER    Patient  was reminded to have all medications, supplements and any blood glucose and blood pressure readings available for review with Jeni Salles, Pharm. D, for telephone visit on 12/13 at 10:30.   Care Gaps: Urine Micro - Overdue TDAP - Overdue COVID Booster - Overdue Foot EXAM - Overdue Zoster Vaccines - Overdue PNA Vaccines - Overdue Eye Exam - Overdue Flu Vaccine - Overdue AWV - Scheduled for 08-02-21 CCM - 04-26-2021   Star Rating Drug: Simvastatin (Zocor) 40 mg  - Last filled 03/25/2021 90 DS  Metformin (Glucophage) 1000 mg - Last filled 02/14/2021 90 DS at [facility]   Any gaps in medications fill history?  None    Medications: Outpatient Encounter Medications as of 05/02/2021  Medication Sig Note   albuterol (VENTOLIN HFA) 108 (90 Base) MCG/ACT inhaler Inhale 2 puffs into the lungs every 6 (six) hours as needed for wheezing or shortness of breath.    cyclobenzaprine (FLEXERIL) 10 MG tablet Take 1 tablet (10 mg total) by mouth 3 (three) times daily as needed (TMJ pain). for muscle spams    esomeprazole (NEXIUM) 40 MG capsule Take 1 capsule (40 mg total) by mouth daily. Take 40 mg twice a day for 4 weeks. Then 1 daily thereafter.    fluticasone (FLONASE) 50 MCG/ACT nasal spray Place 1-2 sprays into both nostrils daily as needed.    glucose blood (TRUE METRIX BLOOD GLUCOSE TEST) test strip 1 each by Other route 4 (four) times daily as needed for other. And lancets 2/day    hydrocortisone valerate ointment (WESTCORT) 0.2 % Apply 1 application topically 2 (two) times daily.    ibuprofen (ADVIL) 200 MG tablet Take 200 mg by mouth every 8 (eight) hours as needed.    LANTUS SOLOSTAR 100 UNIT/ML Solostar Pen INJECT 85 UNITS SUBCUTANEOUSLY ONCE DAILY IN THE MORNING 07/27/2020: Take 45 units     levothyroxine (SYNTHROID) 75 MCG tablet Take  1 tablet (75 mcg total) by mouth daily before breakfast.    metFORMIN (GLUCOPHAGE) 1000 MG tablet Take 1 tablet (1,000 mg total) by mouth 2 (two) times daily with a meal.    metoprolol tartrate (LOPRESSOR) 25 MG tablet Take 1 tablet (25 mg total) by mouth 2 (two) times daily.    Olopatadine HCl (PATADAY) 0.2 % SOLN Place 1 drop into both eyes daily as needed.    Semaglutide,0.25 or 0.5MG/DOS, 2 MG/1.5ML SOPN Inject into the skin.    Semaglutide,0.25 or 0.5MG/DOS, 2 MG/1.5ML SOPN Inject into the skin.    simvastatin (ZOCOR) 40 MG tablet Take 1 tablet (40 mg total) by mouth daily.    simvastatin (ZOCOR) 40 MG tablet Take 1 tablet by mouth at bedtime.    No facility-administered encounter medications on file as of 05/02/2021.     [facility] Clinical Pharmacist Assistant [phone]

## 2021-05-03 ENCOUNTER — Ambulatory Visit (INDEPENDENT_AMBULATORY_CARE_PROVIDER_SITE_OTHER): Payer: Medicare Other | Admitting: Pharmacist

## 2021-05-03 ENCOUNTER — Other Ambulatory Visit: Payer: Self-pay | Admitting: Emergency Medicine

## 2021-05-03 DIAGNOSIS — I10 Essential (primary) hypertension: Secondary | ICD-10-CM

## 2021-05-03 DIAGNOSIS — J4540 Moderate persistent asthma, uncomplicated: Secondary | ICD-10-CM

## 2021-05-03 DIAGNOSIS — E1142 Type 2 diabetes mellitus with diabetic polyneuropathy: Secondary | ICD-10-CM

## 2021-05-03 NOTE — Progress Notes (Signed)
Chronic Care Management Pharmacy Note  05/21/2021 Name:  Terri Gray MRN:  [id number] DOB:  [birth year]  Summary: BP is at goal < 140/90 per home and office readings Pt reports persistent migraines  Recommendations/Changes made from today's visit: -Recommend repeat TSH and lipid panel as patient is overdue -Recommend switching metoprolol to propranolol or trial of low dose diltiazem for migraine prevention -Recommended routine BP monitoring at home  Plan: Follow up BP assessment in 1-2 months   Subjective: Terri Gray is an 65 y.o. year old female who is a primary patient of [name], MD.  The CCM team was consulted for assistance with disease management and care coordination needs.    Engaged with patient by telephone for follow up visit in response to provider referral for pharmacy case management and/or care coordination services.   Consent to Services:  The patient was given information about Chronic Care Management services, agreed to services, and gave verbal consent prior to initiation of services.  Please see initial visit note for detailed documentation.   Patient Care Team: [name], MD as PCP - General (Family Medicine) [name], [facility] as Pharmacist (Pharmacist)  Recent office visits: 03/31/21 Terri Mitts, MD: Patient presented for knee pain and rash. Plan for monthly vitamin B12 injections.  11-24-2020 [name], MD ( PCP) - Patient presented for televisit for COVID-19 virus infection and other concerns. Prescribed Molnupiravir EUA 200 mg and SILVADENE 1 % cream. Changed Cyclobenzaprine to 10 mg PRN and Nexium to 102m 2x a day for 4 weeks then once a day after 4 weeks.   07-27-2020 [name] LPN (PCP) - Patient presented for Annual Wellness Exam. Discontinued Hydroxyzine 245m Pantoprazole 4033mClenpiq 10-3.5-12 MG-GM -GM/160ML, Tacrolimus, and Triamcinolone cream.  Recent consult visits: 05/02/21 Terri Gray  (ortho): Patient presented for initial visit for right knee pain. Recommended Voltaren gel and Aleve.  01/28/21 Terri Gray (pulmonary): Patient presented for asthma follow up. Plan for PFTs.  01/25/21 Terri Gray) - Patient presented for diabetes follow up. Restart Ozempic 0.25 mg weekly. Plan to apply for patient assistance.  01-14-2021 Terri Gray - Patient presented for DEXA & Mammogram.  10-27-2020 Terri Gray (Otolaryngology) - Patient presented for Sensorineural hearing loss (SNHL) of both ears and other concerns. Non medication changes. Plan : To follow up about hearing aids with audiologist.   09-17-2020 Terri Gray) - Patient presented for Type 2 diabetes mellitus with diabetic polyneuropathy, Essential hypertension; Mixed hyperlipidemia; and Acquired hypothyroidism. Restarted Ozempic 0.25 mg inject once weekly.  Hospital visits: None in previous 6 months   Objective:  Lab Results  Component Value Date   CREATININE 0.87 04/01/2021   BUN 10 04/01/2021   GFR 69.59 04/01/2021   GFRNONAA 81 02/05/2020   GFRAA 93 02/05/2020   NA 136 04/01/2021   K 4.8 04/01/2021   CALCIUM 9.5 04/01/2021   CO2 29 04/01/2021   GLUCOSE 107 (H) 04/01/2021    Lab Results  Component Value Date/Time   HGBA1C 6.4 04/01/2021 09:01 AM   HGBA1C 6.8 (H) 12/03/2019 09:34 AM   GFR 69.59 04/01/2021 09:01 AM   GFR 81.47 03/11/2019 07:57 AM    Last diabetic Eye exam:  Lab Results  Component Value Date/Time   HMDIABEYEEXA No Retinopathy 08/29/2017 12:00 AM    Last diabetic Foot exam: No results found for: HMDIABFOOTEX   Lab Results  Component Value Date   CHOL 158 12/03/2019  HDL 44 (L) 12/03/2019   LDLCALC 85 12/03/2019   TRIG 191 (H) 12/03/2019   CHOLHDL 3.6 12/03/2019    Hepatic Function Latest Ref Rng & Units 04/01/2021 02/05/2020 12/03/2019  Total Protein 6.0 - 8.3 g/dL 7.7 7.1 7.0  Albumin 3.5 - 5.2 g/dL 4.1 4.1 -   AST 0 - 37 U/L 20 25 23   ALT 0 - 35 U/L 14 16 19   Alk Phosphatase 39 - 117 U/L 82 107 -  Total Bilirubin 0.2 - 1.2 mg/dL 0.6 0.2 0.5    Lab Results  Component Value Date/Time   TSH 0.67 04/01/2021 09:01 AM   TSH 0.97 03/11/2019 07:57 AM    CBC Latest Ref Rng & Units 04/01/2021 02/05/2020 12/03/2019  WBC 4.0 - 10.5 K/uL 6.3 6.8 6.2  Hemoglobin 12.0 - 15.0 g/dL 11.7(L) 11.7 11.6(L)  Hematocrit 36.0 - 46.0 % 36.1 37.6 37.2  Platelets 150.0 - 400.0 K/uL 355.0 374 324    No results found for: VD25OH  Clinical ASCVD: No  The 10-year ASCVD risk score (Arnett DK, et al., 2019) is: 14.1%   Values used to calculate the score:     Age: 65 years     Sex: Female     Is Non-Hispanic African American: No     Diabetic: Yes     Tobacco smoker: No     Systolic Blood Pressure: 160 mmHg     Is BP treated: Yes     HDL Cholesterol: 44 mg/dL     Total Cholesterol: 158 mg/dL    Depression screen [facility] 2/9 07/27/2020 07/10/2019 02/28/2017  Decreased Interest 0 0 0  Down, Depressed, Hopeless 0 0 0  PHQ - 2 Score 0 0 0      Social History   Tobacco Use  Smoking Status Never  Smokeless Tobacco Never   BP Readings from Last 3 Encounters:  05/04/21 124/80  03/31/21 140/88  01/28/21 124/76   Pulse Readings from Last 3 Encounters:  05/04/21 97  03/31/21 69  01/28/21 72   Wt Readings from Last 3 Encounters:  05/04/21 202 lb 6.4 oz (91.8 kg)  03/31/21 201 lb 12.8 oz (91.5 kg)  01/28/21 207 lb (93.9 kg)   BMI Readings from Last 3 Encounters:  05/04/21 33.68 kg/m  03/31/21 32.57 kg/m  01/28/21 33.41 kg/m    Assessment/Interventions: Review of patient past medical history, allergies, medications, health status, including review of consultants reports, laboratory and other test data, was performed as part of comprehensive evaluation and provision of chronic care management services.   SDOH:  (Social Determinants of Health) assessments and interventions performed: Yes   SDOH  Screenings   Alcohol Screen: Low Risk    Last Alcohol Screening Score (AUDIT): 0  Depression (PHQ2-9): Low Risk    PHQ-2 Score: 0  Financial Resource Strain: Low Risk    Difficulty of Paying Living Expenses: Not hard at all  Food Insecurity: No Food Insecurity   Worried About Charity fundraiser in the Last Year: Never true   Ran Out of Food in the Last Year: Never true  Housing: Low Risk    Last Housing Risk Score: 0  Physical Activity: Sufficiently Active   Days of Exercise per Week: 7 days   Minutes of Exercise per Session: 30 min  Social Connections: Moderately Isolated   Frequency of Communication with Friends and Family: More than three times a week   Frequency of Social Gatherings with Friends and Family: Three times a week  Attends Religious Services: Never   Active Member of Clubs or Organizations: No   Attends Archivist Meetings: Never   Marital Status: Married  Stress: No Stress Concern Present   Feeling of Stress : Not at all  Tobacco Use: Low Risk    Smoking Tobacco Use: Never   Smokeless Tobacco Use: Never   Passive Exposure: Not on file  Transportation Needs: No Transportation Needs   Lack of Transportation (Medical): No   Lack of Transportation (Non-Medical): No    CCM Care Plan  Allergies  Allergen Reactions   Gabapentin     Stomach ache    Medications Reviewed Today     Reviewed by [name], [facility] (Pharmacist) on 05/21/21 at [place] List Status: <None>   Medication Order Taking? Sig Documenting Provider Last Dose Status Informant  albuterol (VENTOLIN HFA) 108 (90 Base) MCG/ACT inhaler [id number] No Inhale 2 puffs into the lungs every 6 (six) hours as needed for wheezing or shortness of breath. [name], MD Taking Active   budesonide-formoterol [facility]) 160-4.5 MCG/ACT inhaler [id number]  Inhale 2 puffs into the lungs in the morning and at bedtime. Collene Gobble, MD  Active   cyclobenzaprine (FLEXERIL) 10 MG tablet  [id number] No Take 1 tablet (10 mg total) by mouth 3 (three) times daily as needed (TMJ pain). for muscle spams [name], MD Taking Active   esomeprazole (NEXIUM) 40 MG capsule [id number] No Take 1 capsule (40 mg total) by mouth daily. Take 40 mg twice a day for 4 weeks. Then 1 daily thereafter. [name], MD Taking Active   fluticasone [facility]) 50 MCG/ACT nasal spray [id number] No Place 1-2 sprays into both nostrils daily as needed. Bobbitt, [place] Muta, MD Taking Active   glucose blood (TRUE METRIX BLOOD GLUCOSE TEST) test strip [id number] No 1 each by Other route 4 (four) times daily as needed for other. And lancets 2/day [name], MD Taking Active   hydrocortisone valerate ointment (WESTCORT) 0.2 % [id number] No Apply 1 application topically 2 (two) times daily. [name], MD Taking Active   ibuprofen (ADVIL) 200 MG tablet [id number] No Take 200 mg by mouth every 8 (eight) hours as needed. [provider] Taking Active   LANTUS SOLOSTAR 100 UNIT/ML Solostar Pen [id number] No INJECT 28 UNITS SUBCUTANEOUSLY ONCE DAILY IN THE Lynnell Grain, MD Taking Active            Med Note Al Corpus, [facility] R   Tue Jul 27, 2020  8:38 AM) Take 45 units    levothyroxine (SYNTHROID) 75 MCG tablet [id number] No Take 1 tablet (75 mcg total) by mouth daily before breakfast. Renato Shin, MD Taking Active   metFORMIN (GLUCOPHAGE) 1000 MG tablet [id number] No Take 1 tablet (1,000 mg total) by mouth 2 (two) times daily with a meal. [name], MD Taking Active   metoprolol tartrate (LOPRESSOR) 25 MG tablet [id number] No Take 1 tablet (25 mg total) by mouth 2 (two) times daily. [name], MD Taking Active   Olopatadine HCl (PATADAY) 0.2 % SOLN [id number] No Place 1 drop into both eyes daily as needed. Bobbitt, [place] Muta, MD Taking Active   Semaglutide,0.25 or 0.5MG/DOS, 2 MG/1.5ML SOPN [id number] No Inject into the skin. [provider] Taking Active   simvastatin  (ZOCOR) 40 MG tablet [id number] No Take 1 tablet (40 mg total) by mouth daily. [name], MD Taking Active  Patient Active Problem List   Diagnosis Date Noted   GERD (gastroesophageal reflux disease) 02/09/2020   Urticaria 02/03/2020   Seasonal and perennial allergic rhinitis 02/03/2020   Allergic conjunctivitis 02/03/2020   Moderate persistent asthma 02/03/2020   Acquired hypothyroidism 12/17/2019   TMJ (temporomandibular joint disorder) 12/17/2019   Seasonal allergies 12/17/2019   Foot pain, left 07/16/2018   Palpitations 07/10/2018   Dyspnea on exertion 07/10/2018   Atypical chest pain 07/10/2018   Dizziness 07/10/2018   Localized osteoarthritis of right knee 02/28/2018   Radiculopathy of cervical spine 02/28/2018   Goiter 11/12/2017   Diabetes ([place]) 05/30/2017    Immunization History  Administered Date(s) Administered   Fluad Quad(high Dose 65+) 02/18/2019   Influenza, High Dose Seasonal PF 04/03/2021   Influenza,inj,Quad PF,6+ Mos 02/20/2018   Influenza-Unspecified 06/10/2020   PFIZER(Purple Top)SARS-COV-2 Vaccination 06/26/2019, 07/17/2019, 03/18/2020, 09/22/2020   Pneumococcal Conjugate-13 03/01/2018   Zoster Recombinat (Shingrix) 10/28/2019   Patient reports she is having some numbness in her little finger in right hand and this bothers her sometimes.  Patient reported she saw the orthopedic doctor yesterday and he told her that the next step is replacement of her right knee. He does not wish to do cortisone injections because of the effects on her blood sugars. She was surprised that this was the next step. She is thinking about doing it but needs time to decide.  Patient reports she is still having migraines and has never been on a migraine prevention medications. She sometimes has migraines every day.  Conditions to be addressed/monitored:  Hyperlipidemia, Diabetes, GERD, Asthma, Hypothyroidism, Osteoarthritis, and Allergic  Rhinitis  Conditions addressed this visit: Osteoarthritis, diabetes, migraines  Care Plan : CCM Pharmacy Care Plan  Updates made by [name], [place] since 05/21/2021 12:00 AM     Problem: Problem: Hyperlipidemia, Diabetes, GERD, Asthma, Hypothyroidism, Osteoarthritis, and Allergic Rhinitis      Long-Range Goal: Patient-Specific Goal   Start Date: 12/21/2020  Expected End Date: 12/21/2021  Recent Progress: On track  Priority: High  Note:   Current Barriers:  Unable to independently monitor therapeutic efficacy Unable to achieve control of migraines   Pharmacist Clinical Goal(s):  Patient will achieve adherence to monitoring guidelines and medication adherence to achieve therapeutic efficacy through collaboration with PharmD and provider.   Interventions: 1:1 collaboration with [name], MD regarding development and update of comprehensive plan of care as evidenced by provider attestation and co-signature Inter-disciplinary care team collaboration (see longitudinal plan of care) Comprehensive medication review performed; medication list updated in electronic medical record  Hypertension (BP goal <140/90) -Not ideally controlled -Current treatment: Metoprolol tartrate 25 mg 1 tablet twice daily -Medications previously tried: none  -Current home readings: 109/75, 125/80 -Current dietary habits: limits salt intake; cut down on spices and salt; uses pepper -Current exercise habits: not consistently -Denies hypotensive/hypertensive symptoms -Educated on BP goals and benefits of medications for prevention of heart attack, stroke and kidney damage; Exercise goal of 150 minutes per week; Importance of home blood pressure monitoring; Proper BP monitoring technique; -Counseled to monitor BP at home weekly, document, and provide log at future appointments -Counseled on diet and exercise extensively Recommended to continue current medication  Hyperlipidemia: (LDL goal <  100) -Controlled -Current treatment: Simvastatin 40 mg 1 tablet daily -Medications previously tried: none  -Current dietary patterns: no fried foods; more chicken and fish -Current exercise habits: not consistently -Educated on Cholesterol goals;  Benefits of statin for ASCVD risk reduction; Importance of limiting  foods high in cholesterol; Exercise goal of 150 minutes per week; -Counseled on diet and exercise extensively Recommended to continue current medication Recommended repeat lipid panel.  Diabetes (A1c goal <7%) -Controlled -Current medications: Lantus 100 units/mL inject 40 twice daily - reduces by 5 units when on Ozempic Ozempic inject 0.25 mg once weekly Metformin 1000 mg 1 tablet twice daily with a meal -Medications previously tried: Janumet, Levemir, [place] -Current home glucose readings fasting glucose: 120 (highest), 70-90 post prandial glucose: uses CGM -Reports hypoglycemic/hyperglycemic symptoms -Current meal patterns:  breakfast: boost (glucose control), fruit  lunch: salad  dinner: meat with vegetables, naan snacks: not too many sweets; small bag of chips drinks: water; soda in case of emergencies, coke zero -Current exercise: goes up and down the stairs; doesn't do more than 10 minutes -Educated on A1c and blood sugar goals; Prevention and management of hypoglycemic episodes; Continuous glucose monitoring; Carbohydrate counting and/or plate method -Counseled to check feet daily and get yearly eye exams -Counseled on diet and exercise extensively Recommended to continue current medication Recommended taking metformin after meals to minimize stomach upset Provided healthy plate handout and hypoglycemia handout.  Asthma (Goal: control symptoms) -Controlled -Current treatment  Albuterol HFA as needed -Medications previously tried: none  -Pulmonary function testing: n/a -Exacerbations requiring treatment in last 6 months: none -Patient denies  consistent use of maintenance inhaler -Frequency of rescue inhaler use: not often -Counseled on When to use rescue inhaler -Recommended to continue current medication  GERD (Goal: minimize symptoms of acid reflux) -Controlled -Current treatment  Famotidine 20 mg 1 tablet twice daily as needed Esomeprazole 40 mg 1 capsule daily -Medications previously tried: nine  -Recommended to continue current medication Counseled on non-pharmacologic management of symptoms such as elevating the head of your bed, avoiding eating 2-3 hours before bed, avoiding triggering foods such as acidic, spicy, or fatty foods, eating smaller meals, and wearing clothes that are loose around the waist  Allergic rhinitis (Goal: minimize symptoms) -Controlled -Current treatment  Fluticasone 50 mcg/act 1-2 sprays daily as needed -Medications previously tried: none  -Recommended to continue current medication  Hypothyroidism (Goal: 0.35-4.5) -Controlled -Current treatment  Synthroid 75 mcg 1 tablet daily -Medications previously tried: none  -Recommended repeat TSH.  TMJ (Goal: minimize pain) -Controlled -Current treatment  Cyclobenzaprine 10 mg 1 tablet daily as needed -Medications previously tried: none  -Recommended to continue current medication  Migraines (Goal: minimize severity of migraines) -Not ideally controlled -Current treatment  Ibuprofen 400 mg taking daily  -Medications previously tried: Excedrin  -Recommended medication for migraine prevention.   Health Maintenance -Vaccine gaps: 2nd dose of shingrix, COVID booster, Pneumovax, tetanus -Current therapy:  Pataday 0.2% solution as needed Hydrocortisone 0.2% ointment apply twice daily Ibuprofen 200 mg as needed -Educated on Cost vs benefit of each product must be carefully weighed by individual consumer -Patient is satisfied with current therapy and denies issues -Recommended to continue current medication Counseled on risks for bleeding  with NSAID use  Patient Goals/Self-Care Activities Patient will:  - take medications as prescribed check glucose with CGM, document, and provide at future appointments check blood pressure weekly, document, and provide at future appointments target a minimum of 150 minutes of moderate intensity exercise weekly  Follow Up Plan: The care management team will reach out to the patient again over the next 30 days.         Compliance/Adherence/Medication fill history: Care Gaps: Foot exam, eye exam, tetanus, COVID booster, 2nd dose of shingrix, pneumovax, urine microalbumin, PAP  smear   Star-Rating Drugs: Semaglutide (Ozempic) - Patient receives samples from Dr when she visits Simvastatin (Zocor) 40 mg  - Last filled 03/25/2021 90 DS  Metformin (Glucophage) 1000 mg - Last filled 02/14/2021 90 DS at [facility]   Patient's preferred pharmacy is:  [id number] - [place], [place] - [address] [place] [address] Phone: [phone] Fax: [phone]   Was having issues with insulin   Uses pill box? No - doesn't need   Pt endorses 99% compliance  Husband uses pillbox  We discussed: Current pharmacy is preferred with insurance plan and patient is satisfied with pharmacy services Patient decided to: Continue current medication management strategy  Care Plan and Follow Up Patient Decision:  Patient agrees to Care Plan and Follow-up.  Plan: The care management team will reach out to the patient again over the next 30 days.  Jeni Salles, PharmD, [place] Pharmacist [place] at [place]

## 2021-05-04 ENCOUNTER — Ambulatory Visit (INDEPENDENT_AMBULATORY_CARE_PROVIDER_SITE_OTHER): Payer: Medicare Other | Admitting: Emergency Medicine

## 2021-05-04 ENCOUNTER — Encounter: Payer: Self-pay | Admitting: Emergency Medicine

## 2021-05-04 ENCOUNTER — Ambulatory Visit: Payer: Medicare Other | Admitting: Emergency Medicine

## 2021-05-04 ENCOUNTER — Other Ambulatory Visit: Payer: Self-pay

## 2021-05-04 DIAGNOSIS — J3089 Other allergic rhinitis: Secondary | ICD-10-CM | POA: Diagnosis not present

## 2021-05-04 DIAGNOSIS — J4540 Moderate persistent asthma, uncomplicated: Secondary | ICD-10-CM

## 2021-05-04 DIAGNOSIS — K219 Gastro-esophageal reflux disease without esophagitis: Secondary | ICD-10-CM

## 2021-05-04 LAB — PULMONARY FUNCTION TEST
DL/VA % pred: 108 %
DL/VA: 5.36 ml/min/mmHg/L
DLCO cor % pred: 77 %
DLCO cor: 19.91 ml/min/mmHg
DLCO unc % pred: 73 %
DLCO unc: 18.78 ml/min/mmHg
FEF 25-75 Post: 2.73 L/sec
FEF 25-75 Pre: 2.07 L/sec
FEF2575-%Change-Post: 31 %
FEF2575-%Pred-Post: 138 %
FEF2575-%Pred-Pre: 105 %
FEV1-%Change-Post: 8 %
FEV1-%Pred-Post: 89 %
FEV1-%Pred-Pre: 83 %
FEV1-Post: 2.01 L
FEV1-Pre: 1.86 L
FEV1FVC-%Change-Post: 1 %
FEV1FVC-%Pred-Pre: 103 %
FEV6-%Change-Post: 6 %
FEV6-Post: 2.4 L
FEV6-Pre: 2.26 L
FEV6FVC-%Change-Post: 0 %
FVC-%Change-Post: 6 %
FVC-%Pred-Post: 85 %
FVC-%Pred-Pre: 79 %
FVC-Post: 2.42 L
FVC-Pre: 2.26 L
Post FEV1/FVC ratio: 83 %
Post FEV6/FVC ratio: 100 %
Pre FEV1/FVC ratio: 82 %
Pre FEV6/FVC Ratio: 100 %
RV % pred: 72 %
RV: 1.57 L
TLC % pred: 80 %
TLC: 4.21 L

## 2021-05-04 MED ORDER — BUDESONIDE-FORMOTEROL FUMARATE 160-4.5 MCG/ACT IN AERO: 2.0000 | INHALATION_SPRAY | Freq: Two times a day (BID) | RESPIRATORY_TRACT | 6 refills | Status: AC

## 2021-05-04 NOTE — Assessment & Plan Note (Signed)
No targets for immunotherapy.  She has been seen by Dr. Verlin Fester.  Currently on fluticasone nasal spray, [place], Benadryl.  Plan to continue same

## 2021-05-04 NOTE — Assessment & Plan Note (Signed)
PFT inconsistent with significant obstruction - mainly restricted with ? 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 Most of her symptoms seem to be nasal sinus with associated cough.  That said she believes that she got clinical benefit from Symbicort in the past.  I willing to try it to see if she benefits despite no significant evidence for asthma on her PFT.  We will discuss clinical benefit next time.

## 2021-05-04 NOTE — Assessment & Plan Note (Signed)
On Nexium daily

## 2021-05-04 NOTE — Addendum Note (Signed)
Addended by: Gavin Potters R on: 05/04/2021 04:39 PM   Modules accepted: Orders

## 2021-05-04 NOTE — Patient Instructions (Signed)
We will try starting Symbicort 160/4.5 mcg, 2 puffs twice a day.  Rinse and gargle after using.  Keep track of with the benefit from this medication.  If so we will plan to continue it. Keep albuterol available to use 2 puffs if needed for shortness of breath, chest tightness, wheezing. Continue your fluticasone nasal spray, Benadryl, nasal saline rinses as you have been using them.  Follow with Dr. Verlin Fester to see if there are any other interventions to help with your residual nasal congestion and allergy symptoms.  I believe that these are contributing to your cough and your shortness of breath. Continue your Nexium once daily. Follow Dr. Lamonte Sakai in 3 months or sooner if you have any problems.

## 2021-05-04 NOTE — Progress Notes (Signed)
PFT done today. 

## 2021-05-04 NOTE — Progress Notes (Signed)
Subjective:    Patient ID: Terri Gray, female    DOB: [birth year], 65 y.o.   MRN: [id number]  HPI 65 year old never smoker with diabetes, GERD, hypertension, IBS, allergic rhinitis, OSA on CPAP as well as a dental device which she uses occasionally as well.  She carries a history of childhood and then adult asthma that was made in the 80's.  She has had spirometry 02/03/2020 which I reviewed, did not show any evidence or clear obstruction, did suggest restriction.  Full PFT not available. Chest x-ray done on 12/15/2019 reviewed by me, shows no infiltrates. Allergy eval >> trees, grasses  She describes new nasal drainage, and congestion, rash on her arms, legs, back beginning in March. ? Related to getting her COVID vaccines in February. Has difficulty getting air through her nose. She is clearing clear mucous from her chest for spells of says, then sx free for several days. No real wheeze. She is still dealing with the rash - dark patches, sometimes papules. She uses flonase, not every day. Was treated with prednisone in April - slight improvement while on it. Started on Symbicort at that time. Has her albuterol - has only used a few times. She has GERD daily, breakthrough sx on protonix qd. She had better results on dexilant. She was just started on xyzal and pepcid by Dr Verlin Fester.    ROV 01/28/21 --65 year old woman, annual follow-up for history of allergic rhinitis, asthma, OSA on CPAP.  She also has a history of GERD.  I saw her 1 year ago at which time I tried stopping Symbicort.  Also recommended that she continue Xyzal, start taking Flonase every day, continue Dexilant and Pepcid.  We had discussed getting PFTs but this was not done.  She has been dealing with chest congestion in the am when she wakes up. She coughs but non-productive. She has albuterol but never uses it. She hears some wheeze. ? Whether this is UA noise. She has intermittent aspiration with food or water. She had endoscopy,  swallowing eval that was unrevealing. She describes a lot of intermittent nasal congestion, uses flonase prn. Dexilant changed to nexium, no breakthrough GERD.   She is also having a papular rash on her arms and neck, unclear exposure related. It is pruritic. Light erythema. She had negative skin testing last year. She saw dermatology, has been given steroids cream, has worked some.   ROV 05/04/21 --follow-up visit 65 year old woman with a history of allergic rhinitis, asthma, OSA on CPAP.  I saw her in September and she was having more congestion, more exertional dyspnea.  She was not on a bronchodilator.  Based on this we decided to repeat her pulmonary function testing as below.  She has both GERD and rhinitis that contribute to cough and bronchitic symptoms. She has been waking up at night with congestion, cough. She has also seen some rash. She started using the flonase. Has some exertional SOB w stairs. She increased her nexium temporarily, now back on qd. She had less GERD but not really less cough. She is using albuterol more often, does help her some. She is using benadryl, is off xyzal. She is doing [place] daily.   Pulmonary function testing performed today and reviewed by me show overall normal airflows, some suggestion of possible restriction.  Restricted lung volumes based on the decreased RV, slightly decreased diffusion capacity.    Review of Systems As per HPI     Objective:   Physical Exam Vitals:   05/04/21  1550  BP: 124/80  Pulse: 97  Temp: 98.2 F (36.8 C)  TempSrc: Oral  SpO2: 100%  Weight: 202 lb 6.4 oz (91.8 kg)  Height: 5' 5"  (1.651 m)   Gen: Pleasant, overwt woman, in no distress,  normal affect  ENT: No lesions,  mouth clear,  oropharynx clear, she does have nasal congestion and obstruction  Neck: No JVD, no stridor today  Lungs: No use of accessory muscles, no crackles or wheezing on normal respiration, no wheeze on forced expiration  Cardiovascular: RRR,  heart sounds normal, no murmur or gallops, no peripheral edema  Musculoskeletal: No deformities, no cyanosis or clubbing  Neuro: alert, awake, non focal  Skin: Warm, no lesions or rash      Assessment & Plan:  Moderate persistent asthma PFT inconsistent with significant obstruction - mainly restricted with ? 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 Most of her symptoms seem to be nasal sinus with associated cough.  That said she believes that she got clinical benefit from Symbicort in the past.  I willing to try it to see if she benefits despite no significant evidence for asthma on her PFT.  We will discuss clinical benefit next time.  Seasonal and perennial allergic rhinitis No targets for immunotherapy.  She has been seen by Dr. Verlin Fester.  Currently on fluticasone nasal spray, [place], Benadryl.  Plan to continue same  GERD (gastroesophageal reflux disease) On Nexium daily  Baltazar Apo, MD, PhD 05/04/2021, 4:29 PM [facility] [phone] or if no answer [phone]

## 2021-05-06 ENCOUNTER — Ambulatory Visit (INDEPENDENT_AMBULATORY_CARE_PROVIDER_SITE_OTHER): Payer: Medicare Other

## 2021-05-06 DIAGNOSIS — E538 Deficiency of other specified B group vitamins: Secondary | ICD-10-CM | POA: Diagnosis not present

## 2021-05-06 MED ORDER — CYANOCOBALAMIN 1000 MCG/ML IJ SOLN
1000.0000 ug | Freq: Once | INTRAMUSCULAR | Status: AC
  Administered 2021-05-06: 1000 ug via INTRAMUSCULAR

## 2021-05-06 NOTE — Progress Notes (Signed)
Per orders of Dr. Volanda Napoleon, injection of Cyanocobalamin 1000 mcg given by Lynnzie Blackson L Graysin Luczynski. Patient tolerated injection well.

## 2021-05-21 DIAGNOSIS — E1142 Type 2 diabetes mellitus with diabetic polyneuropathy: Secondary | ICD-10-CM

## 2021-05-21 DIAGNOSIS — I10 Essential (primary) hypertension: Secondary | ICD-10-CM | POA: Diagnosis not present

## 2021-05-21 DIAGNOSIS — Z794 Long term (current) use of insulin: Secondary | ICD-10-CM

## 2021-05-21 NOTE — Patient Instructions (Signed)
Hi Joyce Gross,  It was great to get to speak with you again!  Please reach out to me if you have any questions or need anything!  Best, Maddie  Jeni Salles, PharmD, [facility]   Visit Information   Goals Addressed   None    Patient Care Plan: [facility]CM Pharmacy Care Plan     Problem Identified: Problem: Hyperlipidemia, Diabetes, GERD, Asthma, Hypothyroidism, Osteoarthritis, and Allergic Rhinitis      Long-Range Goal: Patient-Specific Goal   Start Date: 12/21/2020  Expected End Date: 12/21/2021  Recent Progress: On track  Priority: High  Note:   Current Barriers:  Unable to independently monitor therapeutic efficacy Unable to achieve control of migraines   Pharmacist Clinical Goal(s):  Patient will achieve adherence to monitoring guidelines and medication adherence to achieve therapeutic efficacy through collaboration with PharmD and provider.   Interventions: 1:1 collaboration with [name], MD regarding development and update of comprehensive plan of care as evidenced by provider attestation and co-signature Inter-disciplinary care team collaboration (see longitudinal plan of care) Comprehensive medication review performed; medication list updated in electronic medical record  Hypertension (BP goal <140/90) -Not ideally controlled -Current treatment: Metoprolol tartrate 25 mg 1 tablet twice daily -Medications previously tried: none  -Current home readings: 109/75, 125/80 -Current dietary habits: limits salt intake; cut down on spices and salt; uses pepper -Current exercise habits: not consistently -Denies hypotensive/hypertensive symptoms -Educated on BP goals and benefits of medications for prevention of heart attack, stroke and kidney damage; Exercise goal of 150 minutes per week; Importance of home blood pressure monitoring; Proper BP monitoring technique; -Counseled to monitor BP at home weekly,  document, and provide log at future appointments -Counseled on diet and exercise extensively Recommended to continue current medication  Hyperlipidemia: (LDL goal < 100) -Controlled -Current treatment: Simvastatin 40 mg 1 tablet daily -Medications previously tried: none  -Current dietary patterns: no fried foods; more chicken and fish -Current exercise habits: not consistently -Educated on Cholesterol goals;  Benefits of statin for ASCVD risk reduction; Importance of limiting foods high in cholesterol; Exercise goal of 150 minutes per week; -Counseled on diet and exercise extensively Recommended to continue current medication Recommended repeat lipid panel.  Diabetes (A1c goal <7%) -Controlled -Current medications: Lantus 100 units/mL inject 40 twice daily - reduces by 5 units when on Ozempic Ozempic inject 0.25 mg once weekly Metformin 1000 mg 1 tablet twice daily with a meal -Medications previously tried: Janumet, Levemir, [place] -Current home glucose readings fasting glucose: 120 (highest), 70-90 post prandial glucose: uses CGM -Reports hypoglycemic/hyperglycemic symptoms -Current meal patterns:  breakfast: boost (glucose control), fruit  lunch: salad  dinner: meat with vegetables, naan snacks: not too many sweets; small bag of chips drinks: water; soda in case of emergencies, coke zero -Current exercise: goes up and down the stairs; doesn't do more than 10 minutes -Educated on A1c and blood sugar goals; Prevention and management of hypoglycemic episodes; Continuous glucose monitoring; Carbohydrate counting and/or plate method -Counseled to check feet daily and get yearly eye exams -Counseled on diet and exercise extensively Recommended to continue current medication Recommended taking metformin after meals to minimize stomach upset Provided healthy plate handout and hypoglycemia handout.  Asthma (Goal: control symptoms) -Controlled -Current treatment  Albuterol  HFA as needed -Medications previously tried: none  -Pulmonary function testing: n/a -Exacerbations requiring treatment in last 6 months: none -Patient denies consistent use of maintenance inhaler -Frequency of rescue inhaler use: not often -Counseled on  When to use rescue inhaler -Recommended to continue current medication  GERD (Goal: minimize symptoms of acid reflux) -Controlled -Current treatment  Famotidine 20 mg 1 tablet twice daily as needed Esomeprazole 40 mg 1 capsule daily -Medications previously tried: nine  -Recommended to continue current medication Counseled on non-pharmacologic management of symptoms such as elevating the head of your bed, avoiding eating 2-3 hours before bed, avoiding triggering foods such as acidic, spicy, or fatty foods, eating smaller meals, and wearing clothes that are loose around the waist  Allergic rhinitis (Goal: minimize symptoms) -Controlled -Current treatment  Fluticasone 50 mcg/act 1-2 sprays daily as needed -Medications previously tried: none  -Recommended to continue current medication  Hypothyroidism (Goal: 0.35-4.5) -Controlled -Current treatment  Synthroid 75 mcg 1 tablet daily -Medications previously tried: none  -Recommended repeat TSH.  TMJ (Goal: minimize pain) -Controlled -Current treatment  Cyclobenzaprine 10 mg 1 tablet daily as needed -Medications previously tried: none  -Recommended to continue current medication  Migraines (Goal: minimize severity of migraines) -Not ideally controlled -Current treatment  Ibuprofen 400 mg taking daily  -Medications previously tried: Excedrin  -Recommended medication for migraine prevention.   Health Maintenance -Vaccine gaps: 2nd dose of shingrix, COVID booster, Pneumovax, tetanus -Current therapy:  Pataday 0.2% solution as needed Hydrocortisone 0.2% ointment apply twice daily Ibuprofen 200 mg as needed -Educated on Cost vs benefit of each product must be carefully weighed  by individual consumer -Patient is satisfied with current therapy and denies issues -Recommended to continue current medication Counseled on risks for bleeding with NSAID use  Patient Goals/Self-Care Activities Patient will:  - take medications as prescribed check glucose with CGM, document, and provide at future appointments check blood pressure weekly, document, and provide at future appointments target a minimum of 150 minutes of moderate intensity exercise weekly  Follow Up Plan: The care management team will reach out to the patient again over the next 30 days.        Patient verbalizes understanding of instructions provided today and agrees to view in [place].  The pharmacy team will reach out to the patient again over the next 30 days.   [name], [facility]

## 2021-05-31 DIAGNOSIS — E1142 Type 2 diabetes mellitus with diabetic polyneuropathy: Secondary | ICD-10-CM | POA: Diagnosis not present

## 2021-05-31 DIAGNOSIS — Z7985 Long-term (current) use of injectable non-insulin antidiabetic drugs: Secondary | ICD-10-CM | POA: Diagnosis not present

## 2021-05-31 DIAGNOSIS — Z7984 Long term (current) use of oral hypoglycemic drugs: Secondary | ICD-10-CM | POA: Diagnosis not present

## 2021-05-31 DIAGNOSIS — E785 Hyperlipidemia, unspecified: Secondary | ICD-10-CM | POA: Diagnosis not present

## 2021-05-31 DIAGNOSIS — I10 Essential (primary) hypertension: Secondary | ICD-10-CM | POA: Diagnosis not present

## 2021-05-31 DIAGNOSIS — E039 Hypothyroidism, unspecified: Secondary | ICD-10-CM | POA: Diagnosis not present

## 2021-05-31 DIAGNOSIS — Z794 Long term (current) use of insulin: Secondary | ICD-10-CM | POA: Diagnosis not present

## 2021-06-06 ENCOUNTER — Ambulatory Visit (INDEPENDENT_AMBULATORY_CARE_PROVIDER_SITE_OTHER): Payer: Medicare Other | Admitting: *Deleted

## 2021-06-06 DIAGNOSIS — E538 Deficiency of other specified B group vitamins: Secondary | ICD-10-CM | POA: Diagnosis not present

## 2021-06-06 MED ORDER — CYANOCOBALAMIN 1000 MCG/ML IJ SOLN
1000.0000 ug | Freq: Once | INTRAMUSCULAR | Status: AC
  Administered 2021-06-06: 1000 ug via INTRAMUSCULAR

## 2021-06-06 NOTE — Progress Notes (Signed)
Per orders of Dr. Volanda Napoleon, injection of Cyanocobalamin 1030mg given by FAgnes Lawrence Patient tolerated injection well.

## 2021-06-08 ENCOUNTER — Ambulatory Visit (INDEPENDENT_AMBULATORY_CARE_PROVIDER_SITE_OTHER): Payer: Medicare Other | Admitting: Family Medicine

## 2021-06-08 VITALS — BP 142/98 | HR 68 | Temp 98.7°F | Wt 203.4 lb

## 2021-06-08 DIAGNOSIS — M1711 Unilateral primary osteoarthritis, right knee: Secondary | ICD-10-CM | POA: Diagnosis not present

## 2021-06-08 DIAGNOSIS — K219 Gastro-esophageal reflux disease without esophagitis: Secondary | ICD-10-CM | POA: Diagnosis not present

## 2021-06-08 DIAGNOSIS — M79604 Pain in right leg: Secondary | ICD-10-CM

## 2021-06-08 DIAGNOSIS — M79605 Pain in left leg: Secondary | ICD-10-CM | POA: Diagnosis not present

## 2021-06-08 DIAGNOSIS — J329 Chronic sinusitis, unspecified: Secondary | ICD-10-CM

## 2021-06-08 DIAGNOSIS — J4540 Moderate persistent asthma, uncomplicated: Secondary | ICD-10-CM

## 2021-06-08 DIAGNOSIS — M542 Cervicalgia: Secondary | ICD-10-CM | POA: Diagnosis not present

## 2021-06-08 DIAGNOSIS — J3089 Other allergic rhinitis: Secondary | ICD-10-CM | POA: Diagnosis not present

## 2021-06-08 MED ORDER — MONTELUKAST SODIUM 10 MG PO TABS: 10.0000 mg | ORAL_TABLET | Freq: Every day | ORAL | 3 refills | Status: DC

## 2021-06-08 MED ORDER — CEFDINIR 300 MG PO CAPS: 300.0000 mg | ORAL_CAPSULE | Freq: Two times a day (BID) | ORAL | 0 refills | Status: AC

## 2021-06-08 NOTE — Progress Notes (Signed)
Subjective:    Patient ID: Terri Gray, female    DOB: Jul 06, 1955, 66 y.o.   MRN: [id number]  Chief Complaint  Patient presents with   Cough    Cough and congestion, skin/rash condition.  Feb first COVID vaccine, noticed sinus issues and skin breakouts. Derm, pulm, and allergist visits. Pul and allergist did not find anything. Derm gave cream for rash , but nothing was found. Thinks she is having a viral infection. Congestion is worse at night, wakes up and sounds like she has been a lifetime smoker. Cough is sometimes productive, has tried all otc medications for cough.    HPI Patient was seen today for several concerns.  Pt with worsened rhinorrhea, congestion, itchy eyes, watery eyes times months.  Patient endorses history of allergies off and on times years.  Patient has seen dermatologist, allergist, pulmonologist for symptoms.  Tried Nettie pot and navage.  Patient states her nose runs constantly, feels like became worse after first COVID-vaccine.  Congestion, cough, wheezing worse at night.  Patient seen by Ortho for neck and knee pain.  Endorses knee pain has become worse.  Also notes pain in bilateral LEs 2/2 problems with vein.  Pain occurs with prolonged standing.  Taking Nexium for GERD.  In the past was on medication Dexilant? which worked well however patient's insurance would no longer cover it.  Past Medical History:  Diagnosis Date   Allergy    Arthritis    Asthma    Chicken pox    Colon polyps    Diabetes mellitus without complication (HCC)    GERD (gastroesophageal reflux disease)    H/O fracture of nose    Broken by ENT   History of colon polyps    Hyperlipidemia    Hypertension    IBS (irritable bowel syndrome)    Migraines    Obesity    Shingles 2012   Sleep apnea    on CPAP machine but uses it 90% of the time because it gives her some complication and does a mouth guard as well   Thyroid disease     Allergies  Allergen Reactions   Gabapentin      Stomach ache    ROS General: Denies fever, chills, night sweats, changes in weight, changes in appetite HEENT: Denies headaches, ear pain, changes in vision, sore throat + rhinorrhea, congestion, chronic sinus problems CV: Denies CP, palpitations, SOB, orthopnea Pulm: Denies SOB +cough, wheezing GI: Denies abdominal pain, nausea, vomiting, diarrhea, constipation +GERD GU: Denies dysuria, hematuria, frequency, vaginal discharge Msk: Denies muscle cramps, joint pains +pain in LEs Neuro: Denies weakness, numbness, tingling Skin: Denies bruising  +rash Psych: Denies depression, anxiety, hallucinations    Objective:    Blood pressure (!) 142/98, pulse 68, temperature 98.7 F (37.1 C), temperature source Oral, weight 203 lb 6.4 oz (92.3 kg), SpO2 98 %.  Gen. Pleasant, well-nourished, in no distress, normal affect   HEENT: [place]/AT, face symmetric, allergic shiners and salute, conjunctiva clear, no scleral icterus, PERRLA, EOMI, nares patent with clear drainage, pharynx without erythema or exudate. TMs full b/l. Neck: No JVD, no thyromegaly, no carotid bruits Lungs: no accessory muscle use, CTAB, no wheezes or rales Cardiovascular: RRR, no m/r/g, no peripheral edema Abdomen: BS present, soft, NT/ND, no hepatosplenomegaly. Musculoskeletal: No deformities, no cyanosis or clubbing, normal tone Neuro:  A&Ox3, CN II-XII intact, normal gait Skin:  Warm, no lesions/ rash   Wt Readings from Last 3 Encounters:  06/08/21 203 lb 6.4 oz (92.3 kg)  05/04/21 202 lb 6.4 oz (91.8 kg)  03/31/21 201 lb 12.8 oz (91.5 kg)    Lab Results  Component Value Date   WBC 6.3 04/01/2021   HGB 11.7 (L) 04/01/2021   HCT 36.1 04/01/2021   PLT 355.0 04/01/2021   GLUCOSE 107 (H) 04/01/2021   CHOL 158 12/03/2019   TRIG 191 (H) 12/03/2019   HDL 44 (L) 12/03/2019   LDLCALC 85 12/03/2019   ALT 14 04/01/2021   AST 20 04/01/2021   NA 136 04/01/2021   K 4.8 04/01/2021   CL 100 04/01/2021   CREATININE 0.87  04/01/2021   BUN 10 04/01/2021   CO2 29 04/01/2021   TSH 0.67 04/01/2021   HGBA1C 6.4 04/01/2021    Assessment/Plan:  Seasonal and perennial allergic rhinitis  -continue saline rinse - Plan: montelukast (SINGULAIR) 10 MG tablet, cefdinir (OMNICEF) 300 MG capsule, Ambulatory referral to ENT  Gastroesophageal reflux disease without esophagitis -continue Nexium 40 mg daily -Avoid foods known to cause symptoms -Continue follow-up with gastroenterology  Localized osteoarthritis of right knee -Discussed supportive care including OTC Voltaren gel, heat, Tylenol - Plan: Ambulatory referral to Orthopedic Surgery  Pain in both lower extremities -History of vascular problems  - Plan: Ambulatory referral to Vascular Surgery  Neck pain -Continue supportive care including heat, stretching, Tylenol - Plan: Ambulatory referral to Orthopedic Surgery  Moderate persistent asthma in adult without complication -Continue albuterol inhaler as needed and Symbicort daily - Plan: montelukast (SINGULAIR) 10 MG tablet  Chronic sinusitis, unspecified location -Given continued symptoms discussed starting Singulair -ABX course x10 days -Consider CT sinus -Given history will place referral to ENT for further evaluation. - Plan: montelukast (SINGULAIR) 10 MG tablet, cefdinir (OMNICEF) 300 MG capsule, Ambulatory referral to ENT  F/u prn in 1 month  More than 50% of over 33 minutes spent in total in caring for this patient was spent face-to-face, reviewing the chart, counseling and/or coordinating care.   [name], MD

## 2021-06-16 ENCOUNTER — Encounter: Payer: Self-pay | Admitting: Family Medicine

## 2021-07-04 ENCOUNTER — Other Ambulatory Visit: Payer: Self-pay

## 2021-07-04 DIAGNOSIS — M79604 Pain in right leg: Secondary | ICD-10-CM

## 2021-07-04 DIAGNOSIS — M79605 Pain in left leg: Secondary | ICD-10-CM

## 2021-07-06 ENCOUNTER — Other Ambulatory Visit: Payer: Self-pay | Admitting: Family Medicine

## 2021-07-06 DIAGNOSIS — K219 Gastro-esophageal reflux disease without esophagitis: Secondary | ICD-10-CM

## 2021-07-06 DIAGNOSIS — T24111A Burn of first degree of right thigh, initial encounter: Secondary | ICD-10-CM

## 2021-07-11 ENCOUNTER — Ambulatory Visit (INDEPENDENT_AMBULATORY_CARE_PROVIDER_SITE_OTHER): Payer: Medicare Other

## 2021-07-11 DIAGNOSIS — E538 Deficiency of other specified B group vitamins: Secondary | ICD-10-CM | POA: Diagnosis not present

## 2021-07-11 MED ORDER — CYANOCOBALAMIN 1000 MCG/ML IJ SOLN
1000.0000 ug | Freq: Once | INTRAMUSCULAR | Status: AC
  Administered 2021-07-11: 1000 ug via INTRAMUSCULAR

## 2021-07-11 NOTE — Progress Notes (Signed)
Pt here for  last monthly B12 injection per Dr Volanda Napoleon.  B12 1081mg given IM left deltoid and pt tolerated injection well.  B12 lab draw scheduled for 08/02/21. Pt to take OTC Vitamin B12 10033m daily.

## 2021-07-11 NOTE — Progress Notes (Signed)
[facility] [place]  ASSESSMENT / PLAN: 66 y.o. female with bilateral upper extremity discomfort and ulnar nerve distribution; bilateral lower extremity radicular versus neuropathic discomfort in the legs.  No evidence of peripheral arterial disease on physical exam or noninvasive testing today.  I suspect she has spinal stenosis.  We will refer her back to her primary care physician for further testing.  She can follow-up with me as needed.  CHIEF COMPLAINT: Bilateral lower extremity pain; bilateral upper extremity paresthesia  HISTORY OF PRESENT ILLNESS: Terri Gray is a 66 y.o. very pleasant female referred to clinic for evaluation of bilateral lower extremity pain and upper extremity numbness.  The patient reports in the lower extremities which she finds difficult to describe.  The pain seems most consistent with a neuropathy on my history.  The pain can occur anytime of day.  The pain occurs at rest and with walking.  The pain seems to be exacerbated by walking.  She does not report the typical limit of ambulation that most claudicant's experience.  She does not report classic rest pain symptoms.  She does report chronic neck and back pain.  Past Medical History:  Diagnosis Date   Allergy    Arthritis    Asthma    Chicken pox    Colon polyps    Diabetes mellitus without complication (HCC)    GERD (gastroesophageal reflux disease)    H/O fracture of nose    Broken by ENT   History of colon polyps    Hyperlipidemia    Hypertension    IBS (irritable bowel syndrome)    Migraines    Obesity    Shingles 2012   Sleep apnea    on CPAP machine but uses it 90% of the time because it gives her some complication and does a mouth guard as well   Thyroid disease     Past Surgical History:  Procedure Laterality Date   COLONOSCOPY     Around 2014- 2015 in [place]   ESOPHAGOGASTRODUODENOSCOPY     Around 2014-2015 [place]   REPLACEMENT TOTAL  KNEE Left 2016    Family History  Problem Relation Age of Onset   Bronchitis Mother    Ovarian cancer Mother 59   GER disease Mother    Colon polyps Mother    Irritable bowel syndrome Mother    Diabetes Father    Kidney disease Father    Heart failure Father    Irritable bowel syndrome Father    Diabetes Sister    Asthma Sister    Urticaria Sister    Diabetes Sister    Allergic rhinitis Daughter    GER disease Daughter    Irritable bowel syndrome Daughter    Irritable bowel syndrome Daughter    Diabetes Brother    Allergic rhinitis Son    Allergic rhinitis Grandchild    Food Allergy Grandchild    Immunodeficiency Neg Hx    Angioedema Neg Hx    Breast cancer Neg Hx     Social History   Socioeconomic History   Marital status: Married    Spouse name: Not on file   Number of children: 3   Years of education: 14   Highest education level: Associate degree: occupational, Hotel manager, or vocational program  Occupational History   Not on file  Tobacco Use   Smoking status: Never   Smokeless tobacco: Never  Vaping Use   Vaping Use: Never used  Substance and Sexual Activity  Alcohol use: No    Comment: last drink was over 30 years ago   Drug use: No   Sexual activity: Not on file  Other Topics Concern   Not on file  Social History Narrative   HH 3    Married   Son lives with them   1 dog   Enjoys reading, tv, Teaching laboratory technician   Social Determinants of Health   Financial Resource Strain: Low Risk    Difficulty of Paying Living Expenses: Not hard at all  Food Insecurity: No Food Insecurity   Worried About Charity fundraiser in the Last Year: Never true   Arboriculturist in the Last Year: Never true  Transportation Needs: No Transportation Needs   Lack of Transportation (Medical): No   Lack of Transportation (Non-Medical): No  Physical Activity: Sufficiently Active   Days of Exercise per Week: 7 days   Minutes of Exercise per Session: 30 min  Stress: No Stress  Concern Present   Feeling of Stress : Not at all  Social Connections: Moderately Isolated   Frequency of Communication with Friends and Family: More than three times a week   Frequency of Social Gatherings with Friends and Family: Three times a week   Attends Religious Services: Never   Active Member of Clubs or Organizations: No   Attends Archivist Meetings: Never   Marital Status: Married  Human resources officer Violence: Not At Risk   Fear of Current or Ex-Partner: No   Emotionally Abused: No   Physically Abused: No   Sexually Abused: No    Allergies  Allergen Reactions   Gabapentin     Stomach ache    Current Outpatient Medications  Medication Sig Dispense Refill   albuterol (VENTOLIN HFA) 108 (90 Base) MCG/ACT inhaler Inhale 2 puffs into the lungs every 6 (six) hours as needed for wheezing or shortness of breath. 18 g 1   budesonide-formoterol (SYMBICORT) 160-4.5 MCG/ACT inhaler Inhale 2 puffs into the lungs in the morning and at bedtime. 1 each 6   cyclobenzaprine (FLEXERIL) 10 MG tablet Take 1 tablet (10 mg total) by mouth 3 (three) times daily as needed (TMJ pain). for muscle spams 90 tablet 3   esomeprazole (NEXIUM) 40 MG capsule Take 1 capsule (40 mg total) by mouth daily. Take 40 mg twice a day for 4 weeks. Then 1 daily thereafter. 90 capsule 3   fluticasone (FLONASE) 50 MCG/ACT nasal spray Place 1-2 sprays into both nostrils daily as needed. 16 g 5   glucose blood (TRUE METRIX BLOOD GLUCOSE TEST) test strip 1 each by Other route 4 (four) times daily as needed for other. And lancets 2/day 360 each 3   hydrocortisone valerate ointment (WESTCORT) 0.2 % Apply 1 application topically 2 (two) times daily. 60 g 1   ibuprofen (ADVIL) 200 MG tablet Take 200 mg by mouth every 8 (eight) hours as needed.     LANTUS SOLOSTAR 100 UNIT/ML Solostar Pen INJECT 85 UNITS SUBCUTANEOUSLY ONCE DAILY IN THE MORNING 75 mL 0   levothyroxine (SYNTHROID) 75 MCG tablet Take 1 tablet (75 mcg  total) by mouth daily before breakfast. 90 tablet 3   metFORMIN (GLUCOPHAGE) 1000 MG tablet Take 1 tablet (1,000 mg total) by mouth 2 (two) times daily with a meal. 180 tablet 3   metoprolol tartrate (LOPRESSOR) 25 MG tablet Take 1 tablet (25 mg total) by mouth 2 (two) times daily. 180 tablet 3   montelukast (SINGULAIR) 10 MG tablet Take 1  tablet (10 mg total) by mouth at bedtime. 30 tablet 3   Olopatadine HCl (PATADAY) 0.2 % SOLN Place 1 drop into both eyes daily as needed. 2.5 mL 5   Semaglutide,0.25 or 0.5MG/DOS, 2 MG/1.5ML SOPN Inject into the skin.     simvastatin (ZOCOR) 40 MG tablet Take 1 tablet (40 mg total) by mouth daily. 90 tablet 3   No current facility-administered medications for this visit.    PHYSICAL EXAM Vitals:   07/12/21 0938  BP: (!) 141/68  Pulse: 66  Resp: 20  Temp: 98.4 F (36.9 C)  SpO2: 99%  Weight: 200 lb (90.7 kg)  Height: 5' 5"  (1.651 m)    Constitutional: well appearing. no distress. Appears well nourished.  Neurologic: CN intact. no focal findings. no sensory loss. Psychiatric:  Mood and affect symmetric and appropriate. Eyes:  No icterus. No conjunctival pallor. Ears, nose, throat:  mucous membranes moist. Midline trachea.  Cardiac: regular rate and rhythm.  Respiratory:  unlabored. Abdominal:  soft, non-tender, non-distended.  Peripheral vascular: 2+ radial pulses; 2+ DP pulses Extremity: no edema. no cyanosis. no pallor.  Skin: no gangrene. no ulceration.  Lymphatic: no Stemmer's sign. no palpable lymphadenopathy.  PERTINENT LABORATORY AND RADIOLOGIC DATA  Most recent CBC CBC Latest Ref Rng & Units 04/01/2021 02/05/2020 12/03/2019  WBC 4.0 - 10.5 K/uL 6.3 6.8 6.2  Hemoglobin 12.0 - 15.0 g/dL 11.7(L) 11.7 11.6(L)  Hematocrit 36.0 - 46.0 % 36.1 37.6 37.2  Platelets 150.0 - 400.0 K/uL 355.0 374 324     Most recent CMP CMP Latest Ref Rng & Units 04/01/2021 02/05/2020 12/03/2019  Glucose 70 - 99 mg/dL 107(H) 169(H) 116(H)  BUN 6 - 23 mg/dL  10 9 8   Creatinine 0.40 - 1.20 mg/dL 0.87 0.78 0.82  Sodium 135 - 145 mEq/L 136 137 139  Potassium 3.5 - 5.1 mEq/L 4.8 4.6 4.5  Chloride 96 - 112 mEq/L 100 101 102  CO2 19 - 32 mEq/L 29 21 26   Calcium 8.4 - 10.5 mg/dL 9.5 9.2 9.2  Total Protein 6.0 - 8.3 g/dL 7.7 7.1 7.0  Total Bilirubin 0.2 - 1.2 mg/dL 0.6 0.2 0.5  Alkaline Phos 39 - 117 U/L 82 107 -  AST 0 - 37 U/L 20 25 23   ALT 0 - 35 U/L 14 16 19     Renal function CrCl cannot be calculated (Patient's most recent lab result is older than the maximum 21 days allowed.).  Hgb A1c MFr Bld (%)  Date Value  04/01/2021 6.4    LDL Cholesterol (Calc)  Date Value Ref Range Status  12/03/2019 85 mg/dL (calc) Final    Comment:    Reference range: <100 . Desirable range <100 mg/dL for primary prevention;   <70 mg/dL for patients with CHD or diabetic patients  with > or = 2 CHD risk factors. Marland Kitchen LDL-C is now calculated using the Martin-Hopkins  calculation, which is a validated novel method providing  better accuracy than the Friedewald equation in the  estimation of LDL-C.  Cresenciano Genre et al. Annamaria Helling. 4627;035(00): 2061-2068  (http://education.[facility].com/faq/FAQ164)      +-------+-----------+-----------+------------+------------+   ABI/TBI Today's ABI Today's TBI Previous ABI Previous TBI   +-------+-----------+-----------+------------+------------+   Right   1.21        0.93                                    +-------+-----------+-----------+------------+------------+   Left  1.15        0.92                                    +-------+-----------+-----------+------------+------------+   Yevonne Aline. Stanford Breed, MD [facility] Phone Number: [phone] 07/12/2021 9:56 AM  Total time spent on preparing this encounter including chart review, data review, collecting history, examining the patient, coordinating care for this new patient, 45 minutes.  Portions of this report may have  been transcribed using voice recognition software.  Every effort has been made to ensure accuracy; however, inadvertent computerized transcription errors may still be present.

## 2021-07-12 ENCOUNTER — Ambulatory Visit: Payer: Medicare Other | Admitting: Vascular Surgery

## 2021-07-12 ENCOUNTER — Encounter: Payer: Self-pay | Admitting: Vascular Surgery

## 2021-07-12 ENCOUNTER — Other Ambulatory Visit: Payer: Self-pay

## 2021-07-12 ENCOUNTER — Ambulatory Visit (HOSPITAL_COMMUNITY)
Admission: RE | Admit: 2021-07-12 | Discharge: 2021-07-12 | Disposition: A | Discharge status: Home / Self Care | Payer: Medicare Other | Source: Ambulatory Visit | Attending: Vascular Surgery | Admitting: Vascular Surgery

## 2021-07-12 VITALS — BP 141/68 | HR 66 | Temp 98.4°F | Resp 20 | Ht 65.0 in | Wt 200.0 lb

## 2021-07-12 DIAGNOSIS — M79604 Pain in right leg: Secondary | ICD-10-CM | POA: Insufficient documentation

## 2021-07-12 DIAGNOSIS — M79605 Pain in left leg: Secondary | ICD-10-CM | POA: Diagnosis not present

## 2021-07-18 DIAGNOSIS — J3489 Other specified disorders of nose and nasal sinuses: Secondary | ICD-10-CM | POA: Diagnosis not present

## 2021-07-18 DIAGNOSIS — R1310 Dysphagia, unspecified: Secondary | ICD-10-CM | POA: Diagnosis not present

## 2021-07-18 DIAGNOSIS — K219 Gastro-esophageal reflux disease without esophagitis: Secondary | ICD-10-CM | POA: Diagnosis not present

## 2021-07-18 DIAGNOSIS — H903 Sensorineural hearing loss, bilateral: Secondary | ICD-10-CM | POA: Diagnosis not present

## 2021-07-18 DIAGNOSIS — J329 Chronic sinusitis, unspecified: Secondary | ICD-10-CM | POA: Diagnosis not present

## 2021-07-18 DIAGNOSIS — R21 Rash and other nonspecific skin eruption: Secondary | ICD-10-CM | POA: Diagnosis not present

## 2021-07-18 DIAGNOSIS — Z9889 Other specified postprocedural states: Secondary | ICD-10-CM | POA: Diagnosis not present

## 2021-07-18 DIAGNOSIS — K449 Diaphragmatic hernia without obstruction or gangrene: Secondary | ICD-10-CM | POA: Diagnosis not present

## 2021-07-19 ENCOUNTER — Other Ambulatory Visit: Payer: Self-pay | Admitting: Physician Assistant

## 2021-07-19 DIAGNOSIS — R1310 Dysphagia, unspecified: Secondary | ICD-10-CM

## 2021-07-25 ENCOUNTER — Other Ambulatory Visit: Payer: Self-pay | Admitting: Family Medicine

## 2021-07-25 ENCOUNTER — Ambulatory Visit
Admission: RE | Admit: 2021-07-25 | Discharge: 2021-07-25 | Disposition: A | Discharge status: Home / Self Care | Payer: Medicare Other | Source: Ambulatory Visit | Attending: Physician Assistant | Admitting: Physician Assistant

## 2021-07-25 DIAGNOSIS — K224 Dyskinesia of esophagus: Secondary | ICD-10-CM | POA: Diagnosis not present

## 2021-07-25 DIAGNOSIS — K219 Gastro-esophageal reflux disease without esophagitis: Secondary | ICD-10-CM

## 2021-07-25 DIAGNOSIS — R1310 Dysphagia, unspecified: Secondary | ICD-10-CM

## 2021-07-25 DIAGNOSIS — M26609 Unspecified temporomandibular joint disorder, unspecified side: Secondary | ICD-10-CM

## 2021-08-02 ENCOUNTER — Ambulatory Visit: Payer: Medicare Other

## 2021-08-02 ENCOUNTER — Other Ambulatory Visit: Payer: Medicare Other

## 2021-08-02 ENCOUNTER — Telehealth: Payer: Self-pay

## 2021-08-02 NOTE — Telephone Encounter (Signed)
Contacted patient on preferred number listed in notes for scheduled AWV. Patient stated she called and canceled appointment on 08/01/21 due to not feeling well. Okay to reschedule. ?

## 2021-08-09 ENCOUNTER — Ambulatory Visit: Payer: Medicare Other | Admitting: Emergency Medicine

## 2021-08-09 ENCOUNTER — Other Ambulatory Visit: Payer: Self-pay

## 2021-08-09 ENCOUNTER — Encounter: Payer: Self-pay | Admitting: Emergency Medicine

## 2021-08-09 DIAGNOSIS — J3089 Other allergic rhinitis: Secondary | ICD-10-CM | POA: Diagnosis not present

## 2021-08-09 DIAGNOSIS — J4540 Moderate persistent asthma, uncomplicated: Secondary | ICD-10-CM

## 2021-08-09 DIAGNOSIS — K219 Gastro-esophageal reflux disease without esophagitis: Secondary | ICD-10-CM | POA: Diagnosis not present

## 2021-08-09 NOTE — Progress Notes (Signed)
? ?Subjective:  ? ? Patient ID: Terri Gray, female    DOB: March 01, 1956, 66 y.o.   MRN: [id number] ? ?HPI ? ?ROV 05/04/21 --follow-up visit 66 year old woman with a history of allergic rhinitis, asthma, OSA on CPAP.  I saw her in September and she was having more congestion, more exertional dyspnea.  She was not on a bronchodilator.  Based on this we decided to repeat her pulmonary function testing as below.  She has both GERD and rhinitis that contribute to cough and bronchitic symptoms. ?She has been waking up at night with congestion, cough. She has also seen some rash. She started using the flonase. Has some exertional SOB w stairs. She increased her nexium temporarily, now back on qd. She had less GERD but not really less cough. She is using albuterol more often, does help her some. She is using benadryl, is off xyzal. She is doing [place] daily.  ? ?Pulmonary function testing performed today and reviewed by me show overall normal airflows, some suggestion of possible restriction.  Restricted lung volumes based on the decreased RV, slightly decreased diffusion capacity.  ? ? ?ROV 08/09/21 --66 year old woman with a history of allergic rhinitis, OSA on CPAP.  She carries a history of asthma but she has reassuring pulmonary function testing in December 2022 with principally restriction, no obstruction.  She deals with exertional dyspnea, cough, allergic rhinitis and congestion, GERD.  In December I tried starting her empirically on Symbicort to see if she would get benefit. ?On fluticasone nasal, Benadryl, nasal saline rinses, singulair, Nexium. ?She reports that she continues to have chest and nasal congestion. She will intermittently get a rash on her nose/cheeks, upper chest. She was treated for bronchitis in February w abx. She is on the Symbicort since December - may be helping her some, but not for a full 12 hours. She sometimes uses it more than 2x a day. She uses albuterol about 3 x a day  ?Since I have  seen her she has had reassuring swallowing eval and esophagram ?Sinus CT scan at [facility] 07/18/21 was reassuring.  ? ? ?Review of Systems ?As per HPI ? ?   ?Objective:  ? Physical Exam ?Vitals:  ? 08/09/21 0957  ?BP: 126/84  ?Pulse: 83  ?Temp: 98.3 ?F (36.8 ?C)  ?TempSrc: Oral  ?SpO2: 100%  ?Weight: 199 lb (90.3 kg)  ?Height: 5' 6"  (1.676 m)  ? ?Gen: Pleasant, overwt woman, in no distress,  normal affect ? ?ENT: No lesions,  mouth clear,  oropharynx clear, she does have nasal congestion and obstruction ? ?Neck: No JVD, no stridor today ? ?Lungs: No use of accessory muscles, no crackles or wheezing on normal respiration, no wheeze on forced expiration ? ?Cardiovascular: RRR, heart sounds normal, no murmur or gallops, no peripheral edema ? ?Musculoskeletal: No deformities, no cyanosis or clubbing ? ?Neuro: alert, awake, non focal ? ?Skin: Warm, no lesions or rash ? ? ?   ?Assessment & Plan:  ?Moderate persistent asthma ?Again unclear whether this is true asthma.  She did not get lasting effect from Symbicort, would stop this for now.  Consider methacholine challenge to definitively establish whether there is any lower airways obstruction.  For now she can continue to use albuterol.  Much of her symptomatology is related to mucus, obstructing upper airway, nasal passages and possibly also causing bronchitic symptoms.  Interestingly she has had an associated facial and neck upper chest rash of unclear significance.  She has seen dermatology, allergy without any  clear diagnosis.  I will check an eosinophil count, IgE and also an ANA given the facial rash.  These labs can be added onto her standard labs for Dr. Volanda Napoleon which are supposed to be drawn tomorrow. ? ?Stop using your Symbicort for now.  We may decide to repeat pulmonary function testing or perform a methacholine challenge test at some point in the future.  We will hold off for now. ?Keep your albuterol available to use 2 puffs when you needed for shortness of  breath, chest tightness, wheezing. ?We will add on some labs for you to have done when you get your blood work for Dr. Volanda Napoleon. ?Follow with Dr. Lamonte Sakai in 2 months or sooner if you have any problems.  ? ?GERD (gastroesophageal reflux disease) ?Continue Nexium ? ?Seasonal and perennial allergic rhinitis ?Continue your fluticasone nasal spray, Benadryl, Singulair and nasal saline rinses as you have been doing them. ? ?Baltazar Apo, MD, PhD ?08/09/2021, 10:30 AM ?[facility] ?[phone] or if no answer [phone] ? ?

## 2021-08-09 NOTE — Patient Instructions (Addendum)
Stop using your Symbicort for now.  We may decide to repeat pulmonary function testing or perform a methacholine challenge test at some point in the future.  We will hold off for now. ?Keep your albuterol available to use 2 puffs when you needed for shortness of breath, chest tightness, wheezing. ?Continue your fluticasone nasal spray, Benadryl, Singulair and nasal saline rinses as you have been doing them. ?Continue Nexium ?We will add on some labs for you to have done when you get your blood work for Dr. Volanda Napoleon. ?Follow with Dr. Lamonte Sakai in 2 months or sooner if you have any problems.  ? ?

## 2021-08-09 NOTE — Assessment & Plan Note (Signed)
Continue Nexium 

## 2021-08-09 NOTE — Assessment & Plan Note (Signed)
Continue your fluticasone nasal spray, Benadryl, Singulair and nasal saline rinses as you have been doing them. ?

## 2021-08-09 NOTE — Assessment & Plan Note (Addendum)
Again unclear whether this is true asthma.  She did not get lasting effect from Symbicort, would stop this for now.  Consider methacholine challenge to definitively establish whether there is any lower airways obstruction.  For now she can continue to use albuterol.  Much of her symptomatology is related to mucus, obstructing upper airway, nasal passages and possibly also causing bronchitic symptoms.  Interestingly she has had an associated facial and neck upper chest rash of unclear significance.  She has seen dermatology, allergy without any clear diagnosis.  I will check an eosinophil count, IgE and also an ANA given the facial rash.  These labs can be added onto her standard labs for Dr. Volanda Napoleon which are supposed to be drawn tomorrow. ? ?Stop using your Symbicort for now.  We may decide to repeat pulmonary function testing or perform a methacholine challenge test at some point in the future.  We will hold off for now. ?Keep your albuterol available to use 2 puffs when you needed for shortness of breath, chest tightness, wheezing. ?We will add on some labs for you to have done when you get your blood work for Dr. Volanda Napoleon. ?Follow with Dr. Lamonte Sakai in 2 months or sooner if you have any problems.  ?

## 2021-08-10 ENCOUNTER — Other Ambulatory Visit (INDEPENDENT_AMBULATORY_CARE_PROVIDER_SITE_OTHER): Payer: Medicare Other

## 2021-08-10 DIAGNOSIS — E538 Deficiency of other specified B group vitamins: Secondary | ICD-10-CM | POA: Diagnosis not present

## 2021-08-10 LAB — VITAMIN B12: Vitamin B-12: 313 pg/mL (ref 211–911)

## 2021-08-11 ENCOUNTER — Telehealth: Payer: Self-pay | Admitting: Family Medicine

## 2021-08-11 NOTE — Telephone Encounter (Signed)
Patient called because she would like a full panel of lab orders. Patient states that she gets this panel twice a year and assumed that is what she would be getting this last time, but it was only b12. Patient states it includes A1C, thyroid, etc. ? ? ? ?Please advise  ? ? ? ? ? ?

## 2021-08-12 NOTE — Telephone Encounter (Signed)
Spoke with pt, advised she just had those labs done in Nov 2022, Dr Volanda Napoleon did not need all of them done again. ?

## 2021-08-17 ENCOUNTER — Ambulatory Visit (INDEPENDENT_AMBULATORY_CARE_PROVIDER_SITE_OTHER): Payer: Medicare Other

## 2021-08-17 VITALS — BP 128/84 | Temp 98.9°F | Ht 66.0 in | Wt 189.0 lb

## 2021-08-17 DIAGNOSIS — Z0000 Encounter for general adult medical examination without abnormal findings: Secondary | ICD-10-CM | POA: Diagnosis not present

## 2021-08-17 NOTE — Progress Notes (Signed)
? ?Subjective:  ? Terri Gray is a 66 y.o. female who presents for Medicare Annual (Subsequent) preventive examination. ? ?Review of Systems    ?Virtual Visit via Telephone Note ? ?I connected with  Anvita Hoback on 08/17/21 at  9:15 AM EDT by telephone and verified that I am speaking with the correct person using two identifiers. ? ?Location: ?Patient: Home ?Provider: Office ?Persons participating in the virtual visit: patient/Nurse Health Advisor ?  ?I discussed the limitations, risks, security and privacy concerns of performing an evaluation and management service by telephone and the availability of in person appointments. The patient expressed understanding and agreed to proceed. ? ?Interactive audio and video telecommunications were attempted between this nurse and patient, however failed, due to patient having technical difficulties OR patient did not have access to video capability.  We continued and completed visit with audio only. ? ?Some vital signs may be absent or patient reported.  ? ?Criselda Peaches, LPN  ?Cardiac Risk Factors include: advanced age (>62mn, >>6women);diabetes mellitus ? ?   ?Objective:  ?  ?Today's Vitals  ? 08/17/21 0914 08/17/21 0915  ?BP: 128/84   ?Temp: 98.9 ?F (37.2 ?C)   ?Weight: 189 lb (85.7 kg)   ?Height: 5' 6"  (1.676 m)   ?PainSc:  4   ? ?Body mass index is 30.51 kg/m?. ? ? ?  08/17/2021  ?  9:29 AM 07/27/2020  ?  8:41 AM 07/10/2019  ?  9:56 AM  ?Advanced Directives  ?Does Patient Have a Medical Advance Directive? Yes Yes Yes  ?Type of [place]of [place] will [place] will [place] will  ?Does patient want to make changes to medical advance directive? No - Patient declined No - Patient declined No - Patient declined  ?Copy of [place] Chart? No - copy requested No - copy requested No - copy requested  ? ? ?Current Medications (verified) ?Outpatient  Encounter Medications as of 08/17/2021  ?Medication Sig  ? albuterol (VENTOLIN HFA) 108 (90 Base) MCG/ACT inhaler Inhale 2 puffs into the lungs every 6 (six) hours as needed for wheezing or shortness of breath.  ? budesonide-formoterol (SYMBICORT) 160-4.5 MCG/ACT inhaler Inhale 2 puffs into the lungs in the morning and at bedtime.  ? cyclobenzaprine (FLEXERIL) 10 MG tablet TAKE ONE TABLET BY MOUTH THREE TIMES A DAY AS NEEDED FOR MUSCLE SPASMS  ? esomeprazole (NEXIUM) 40 MG capsule TAKE ONE CAPSULE BY MOUTH TWICE A DAY FOR 4 WEEKS, THEN TAKE ONE CAPSULE BY MOUTH DAILY  ? fluticasone (FLONASE) 50 MCG/ACT nasal spray Place 1-2 sprays into both nostrils daily as needed.  ? glucose blood (TRUE METRIX BLOOD GLUCOSE TEST) test strip 1 each by Other route 4 (four) times daily as needed for other. And lancets 2/day  ? hydrocortisone valerate ointment (WESTCORT) 0.2 % Apply 1 application topically 2 (two) times daily.  ? ibuprofen (ADVIL) 200 MG tablet Take 200 mg by mouth every 8 (eight) hours as needed.  ? LANTUS SOLOSTAR 100 UNIT/ML Solostar Pen INJECT 85 UNITS SUBCUTANEOUSLY ONCE DAILY IN THE MORNING  ? levothyroxine (SYNTHROID) 75 MCG tablet Take 1 tablet (75 mcg total) by mouth daily before breakfast.  ? metFORMIN (GLUCOPHAGE) 1000 MG tablet Take 1 tablet (1,000 mg total) by mouth 2 (two) times daily with a meal.  ? metoprolol tartrate (LOPRESSOR) 25 MG tablet Take 1 tablet (25 mg total) by mouth 2 (two) times daily.  ? montelukast (SINGULAIR) 10 MG  tablet Take 1 tablet (10 mg total) by mouth at bedtime.  ? Olopatadine HCl (PATADAY) 0.2 % SOLN Place 1 drop into both eyes daily as needed.  ? Semaglutide,0.25 or 0.5MG/DOS, 2 MG/1.5ML SOPN Inject into the skin.  ? simvastatin (ZOCOR) 40 MG tablet Take 1 tablet (40 mg total) by mouth daily.  ? ?No facility-administered encounter medications on file as of 08/17/2021.  ? ? ?Allergies (verified) ?Gabapentin  ? ?History: ?Past Medical History:  ?Diagnosis Date  ? Allergy   ?  Arthritis   ? Asthma   ? Chicken pox   ? Colon polyps   ? Diabetes mellitus without complication ([place])   ? GERD (gastroesophageal reflux disease)   ? H/O fracture of nose   ? Broken by ENT  ? History of colon polyps   ? Hyperlipidemia   ? Hypertension   ? IBS (irritable bowel syndrome)   ? Migraines   ? Obesity   ? Shingles 2012  ? Sleep apnea   ? on CPAP machine but uses it 90% of the time because it gives her some complication and does a mouth guard as well  ? Thyroid disease   ? ?Past Surgical History:  ?Procedure Laterality Date  ? COLONOSCOPY    ? Around 2014- 2015 in [place]  ? ESOPHAGOGASTRODUODENOSCOPY    ? Around 2014-2015 [place]  ? REPLACEMENT TOTAL KNEE Left 2016  ? ?Family History  ?Problem Relation Age of Onset  ? Bronchitis Mother   ? Ovarian cancer Mother 30  ? GER disease Mother   ? Colon polyps Mother   ? Irritable bowel syndrome Mother   ? Diabetes Father   ? Kidney disease Father   ? Heart failure Father   ? Irritable bowel syndrome Father   ? Diabetes Sister   ? Asthma Sister   ? Urticaria Sister   ? Diabetes Sister   ? Allergic rhinitis Daughter   ? GER disease Daughter   ? Irritable bowel syndrome Daughter   ? Irritable bowel syndrome Daughter   ? Diabetes Brother   ? Allergic rhinitis Son   ? Allergic rhinitis Grandchild   ? Food Allergy Grandchild   ? Immunodeficiency Neg Hx   ? Angioedema Neg Hx   ? Breast cancer Neg Hx   ? ?Social History  ? ?Socioeconomic History  ? Marital status: Married  ?  Spouse name: Not on file  ? Number of children: 3  ? Years of education: 30  ? Highest education level: Associate degree: occupational, Hotel manager, or vocational program  ?Occupational History  ? Not on file  ?Tobacco Use  ? Smoking status: Never  ? Smokeless tobacco: Never  ?Vaping Use  ? Vaping Use: Never used  ?Substance and Sexual Activity  ? Alcohol use: No  ?  Comment: last drink was over 30 years ago  ? Drug use: No  ? Sexual activity: Not on file  ?Other Topics Concern  ?  Not on file  ?Social History Narrative  ? HH 3   ? Married  ? Son lives with them  ? 1 dog  ? Enjoys reading, tv, computer  ? ?Social Determinants of Health  ? ?Financial Resource Strain: Low Risk   ? Difficulty of Paying Living Expenses: Not hard at all  ?Food Insecurity: No Food Insecurity  ? Worried About Charity fundraiser in the Last Year: Never true  ? Ran Out of Food in the Last Year: Never true  ?Transportation Needs: No  Transportation Needs  ? Lack of Transportation (Medical): No  ? Lack of Transportation (Non-Medical): No  ?Physical Activity: Insufficiently Active  ? Days of Exercise per Week: 1 day  ? Minutes of Exercise per Session: 20 min  ?Stress: No Stress Concern Present  ? Feeling of Stress : Not at all  ?Social Connections: Moderately Isolated  ? Frequency of Communication with Friends and Family: More than three times a week  ? Frequency of Social Gatherings with Friends and Family: Twice a week  ? Attends Religious Services: Never  ? Active Member of Clubs or Organizations: No  ? Attends Archivist Meetings: Never  ? Marital Status: Married  ? ? ? ?Clinical Intake: ? ? ?How often do you need to have someone help you when you read instructions, pamphlets, or other written materials from your doctor or pharmacy?: 1 - Never ? ?Diabetic?  Yes ?Nutrition Risk Assessment: ? ?Has the patient had any N/V/D within the last 2 months?  No  ?Does the patient have any non-healing wounds?  No  ?Has the patient had any unintentional weight loss or weight gain?  No  ? ?Diabetes: ? ?Is the patient diabetic?  Yes  ?If diabetic, was a CBG obtained today?  Yes CBG 82 taken by patient ?Did the patient bring in their glucometer from home?  No  ?How often do you monitor your CBG's?  Daily.  ? ?Financial Strains and Diabetes Management: ? ?Are you having any financial strains with the device, your supplies or your medication? No .  ?Does the patient want to be seen by Chronic Care Management for management of  their diabetes?  No  ?Would the patient like to be referred to a Nutritionist or for Diabetic Management?  No  ? ?Diabetic Exams: ? ?Diabetic Eye Exam: Completed Yes. Overdue for diabetic eye exam. Pt has been a

## 2021-08-17 NOTE — Patient Instructions (Addendum)
?[name] , ?Thank you for taking time to come for your Medicare Wellness Visit. I appreciate your ongoing commitment to your health goals. Please review the following plan we discussed and let me know if I can assist you in the future.  ? ?These are the goals we discussed: ? Goals   ? ?   improve pain in foot and ankle (pt-stated)   ?   Improve knee pain ?  ?   remain covid free   ?   Track and Manage My Blood Pressure-Hypertension   ?   Timeframe:  Short-Term Goal ?Priority:  Medium ?Start Date:                             ?Expected End Date:                      ? ?Follow Up Date 04/13/21 ?  ?- check blood pressure weekly ?- choose a place to take my blood pressure (home, clinic or office, retail store) ?- write blood pressure results in a log or diary  ?  ?Why is this important?   ?You won't feel high blood pressure, but it can still hurt your blood vessels.  ?High blood pressure can cause heart or kidney problems. It can also cause a stroke.  ?Making lifestyle changes like losing a little weight or eating less salt will help.  ?Checking your blood pressure at home and at different times of the day can help to control blood pressure.  ?If the doctor prescribes medicine remember to take it the way the doctor ordered.  ?Call the office if you cannot afford the medicine or if there are questions about it.   ?  ?Notes:  ?  ? ?  ?  ?This is a list of the screening recommended for you and due dates:  ?Health Maintenance  ?Topic Date Due  ? Complete foot exam   07/17/2019  ? Eye exam for diabetics  03/25/2021  ? COVID-19 Vaccine (5 - Booster for Pfizer series) 09/02/2021*  ? Urine Protein Check  09/17/2021*  ? Zoster (Shingles) Vaccine (2 of 2) 11/17/2021*  ? Pneumonia Vaccine (2 - PPSV23 if available, else PCV20) 08/18/2022*  ? Tetanus Vaccine  08/18/2022*  ? Hemoglobin A1C  09/29/2021  ? Mammogram  01/15/2023  ? Colon Cancer Screening  03/10/2030  ? Flu Shot  Completed  ? DEXA scan (bone density measurement)   Completed  ? Hepatitis C Screening: USPSTF Recommendation to screen - Ages 64-79 yo.  Completed  ? HPV Vaccine  Aged Out  ?*Topic was postponed. The date shown is not the original due date.  ? ? ?Advanced directives: Yes Patient will bring copy ? ?Conditions/risks identified: None ? ?Next appointment: Follow up in one year for your annual wellness visit  ? ? ?Preventive Care 30 Years and Older, Female ?Preventive care refers to lifestyle choices and visits with your health care provider that can promote health and wellness. ?What does preventive care include? ?A yearly physical exam. This is also called an annual well check. ?Dental exams once or twice a year. ?Routine eye exams. Ask your health care provider how often you should have your eyes checked. ?Personal lifestyle choices, including: ?Daily care of your teeth and gums. ?Regular physical activity. ?Eating a healthy diet. ?Avoiding tobacco and drug use. ?Limiting alcohol use. ?Practicing safe sex. ?Taking low-dose aspirin every day. ?Taking vitamin and mineral supplements  as recommended by your health care provider. ?What happens during an annual well check? ?The services and screenings done by your health care provider during your annual well check will depend on your age, overall health, lifestyle risk factors, and family history of disease. ?Counseling  ?Your health care provider may ask you questions about your: ?Alcohol use. ?Tobacco use. ?Drug use. ?Emotional well-being. ?Home and relationship well-being. ?Sexual activity. ?Eating habits. ?History of falls. ?Memory and ability to understand (cognition). ?Work and work Statistician. ?Reproductive health. ?Screening  ?You may have the following tests or measurements: ?Height, weight, and BMI. ?Blood pressure. ?Lipid and cholesterol levels. These may be checked every 5 years, or more frequently if you are over 26 years old. ?Skin check. ?Lung cancer screening. You may have this screening every year starting  at age 4 if you have a 30-pack-year history of smoking and currently smoke or have quit within the past 15 years. ?Fecal occult blood test (FOBT) of the stool. You may have this test every year starting at age 67. ?Flexible sigmoidoscopy or colonoscopy. You may have a sigmoidoscopy every 5 years or a colonoscopy every 10 years starting at age 37. ?Hepatitis C blood test. ?Hepatitis B blood test. ?Sexually transmitted disease (STD) testing. ?Diabetes screening. This is done by checking your blood sugar (glucose) after you have not eaten for a while (fasting). You may have this done every 1-3 years. ?Bone density scan. This is done to screen for osteoporosis. You may have this done starting at age 53. ?Mammogram. This may be done every 1-2 years. Talk to your health care provider about how often you should have regular mammograms. ?Talk with your health care provider about your test results, treatment options, and if necessary, the need for more tests. ?Vaccines  ?Your health care provider may recommend certain vaccines, such as: ?Influenza vaccine. This is recommended every year. ?Tetanus, diphtheria, and acellular pertussis (Tdap, Td) vaccine. You may need a Td booster every 10 years. ?Zoster vaccine. You may need this after age 58. ?Pneumococcal 13-valent conjugate (PCV13) vaccine. One dose is recommended after age 17. ?Pneumococcal polysaccharide (PPSV23) vaccine. One dose is recommended after age 25. ?Talk to your health care provider about which screenings and vaccines you need and how often you need them. ?This information is not intended to replace advice given to you by your health care provider. Make sure you discuss any questions you have with your health care provider. ?Document Released: 06/04/2015 Document Revised: 01/26/2016 Document Reviewed: 03/09/2015 ?Elsevier Interactive Patient Education ? 2017 [employer]. ? ?Fall Prevention in the Home ?Falls can cause injuries. They can happen to people of  all ages. There are many things you can do to make your home safe and to help prevent falls. ?What can I do on the outside of my home? ?Regularly fix the edges of walkways and driveways and fix any cracks. ?Remove anything that might make you trip as you walk through a door, such as a raised step or threshold. ?Trim any bushes or trees on the path to your home. ?Use bright outdoor lighting. ?Clear any walking paths of anything that might make someone trip, such as rocks or tools. ?Regularly check to see if handrails are loose or broken. Make sure that both sides of any steps have handrails. ?Any raised decks and porches should have guardrails on the edges. ?Have any leaves, snow, or ice cleared regularly. ?Use sand or salt on walking paths during winter. ?Clean up any spills in your garage  right away. This includes oil or grease spills. ?What can I do in the bathroom? ?Use night lights. ?Install grab bars by the toilet and in the tub and shower. Do not use towel bars as grab bars. ?Use non-skid mats or decals in the tub or shower. ?If you need to sit down in the shower, use a plastic, non-slip stool. ?Keep the floor dry. Clean up any water that spills on the floor as soon as it happens. ?Remove soap buildup in the tub or shower regularly. ?Attach bath mats securely with double-sided non-slip rug tape. ?Do not have throw rugs and other things on the floor that can make you trip. ?What can I do in the bedroom? ?Use night lights. ?Make sure that you have a light by your bed that is easy to reach. ?Do not use any sheets or blankets that are too big for your bed. They should not hang down onto the floor. ?Have a firm chair that has side arms. You can use this for support while you get dressed. ?Do not have throw rugs and other things on the floor that can make you trip. ?What can I do in the kitchen? ?Clean up any spills right away. ?Avoid walking on wet floors. ?Keep items that you use a lot in easy-to-reach places. ?If  you need to reach something above you, use a strong step stool that has a grab bar. ?Keep electrical cords out of the way. ?Do not use floor polish or wax that makes floors slippery. If you must use wax,

## 2021-08-18 DIAGNOSIS — R21 Rash and other nonspecific skin eruption: Secondary | ICD-10-CM | POA: Diagnosis not present

## 2021-08-18 DIAGNOSIS — L308 Other specified dermatitis: Secondary | ICD-10-CM | POA: Diagnosis not present

## 2021-08-18 DIAGNOSIS — R7989 Other specified abnormal findings of blood chemistry: Secondary | ICD-10-CM | POA: Diagnosis not present

## 2021-08-18 DIAGNOSIS — Z5181 Encounter for therapeutic drug level monitoring: Secondary | ICD-10-CM | POA: Diagnosis not present

## 2021-10-12 ENCOUNTER — Ambulatory Visit (INDEPENDENT_AMBULATORY_CARE_PROVIDER_SITE_OTHER): Payer: Medicare Other

## 2021-10-12 ENCOUNTER — Ambulatory Visit (INDEPENDENT_AMBULATORY_CARE_PROVIDER_SITE_OTHER): Payer: Medicare Other | Admitting: Family Medicine

## 2021-10-12 VITALS — BP 122/88 | HR 65 | Temp 98.4°F | Wt 200.6 lb

## 2021-10-12 DIAGNOSIS — I10 Essential (primary) hypertension: Secondary | ICD-10-CM | POA: Diagnosis not present

## 2021-10-12 DIAGNOSIS — L409 Psoriasis, unspecified: Secondary | ICD-10-CM | POA: Diagnosis not present

## 2021-10-12 DIAGNOSIS — J4540 Moderate persistent asthma, uncomplicated: Secondary | ICD-10-CM

## 2021-10-12 DIAGNOSIS — R0981 Nasal congestion: Secondary | ICD-10-CM

## 2021-10-12 DIAGNOSIS — E538 Deficiency of other specified B group vitamins: Secondary | ICD-10-CM

## 2021-10-12 DIAGNOSIS — R438 Other disturbances of smell and taste: Secondary | ICD-10-CM

## 2021-10-12 DIAGNOSIS — M25611 Stiffness of right shoulder, not elsewhere classified: Secondary | ICD-10-CM | POA: Diagnosis not present

## 2021-10-12 DIAGNOSIS — M2550 Pain in unspecified joint: Secondary | ICD-10-CM

## 2021-10-12 DIAGNOSIS — K449 Diaphragmatic hernia without obstruction or gangrene: Secondary | ICD-10-CM | POA: Diagnosis not present

## 2021-10-12 DIAGNOSIS — N644 Mastodynia: Secondary | ICD-10-CM | POA: Diagnosis not present

## 2021-10-12 DIAGNOSIS — M25511 Pain in right shoulder: Secondary | ICD-10-CM | POA: Diagnosis not present

## 2021-10-12 DIAGNOSIS — J3089 Other allergic rhinitis: Secondary | ICD-10-CM

## 2021-10-12 LAB — CBC WITH DIFFERENTIAL/PLATELET
Basophils Absolute: 0 10*3/uL (ref 0.0–0.1)
Basophils Relative: 0.7 % (ref 0.0–3.0)
Eosinophils Absolute: 1.2 10*3/uL — ABNORMAL HIGH (ref 0.0–0.7)
Eosinophils Relative: 16.9 % — ABNORMAL HIGH (ref 0.0–5.0)
HCT: 36.9 % (ref 36.0–46.0)
Hemoglobin: 12.2 g/dL (ref 12.0–15.0)
Lymphocytes Relative: 21.3 % (ref 12.0–46.0)
Lymphs Abs: 1.5 10*3/uL (ref 0.7–4.0)
MCHC: 33 g/dL (ref 30.0–36.0)
MCV: 80.6 fl (ref 78.0–100.0)
Monocytes Absolute: 0.6 10*3/uL (ref 0.1–1.0)
Monocytes Relative: 8.6 % (ref 3.0–12.0)
Neutro Abs: 3.6 10*3/uL (ref 1.4–7.7)
Neutrophils Relative %: 52.5 % (ref 43.0–77.0)
Platelets: 358 10*3/uL (ref 150.0–400.0)
RBC: 4.57 Mil/uL (ref 3.87–5.11)
RDW: 17.6 % — ABNORMAL HIGH (ref 11.5–15.5)
WBC: 6.9 10*3/uL (ref 4.0–10.5)

## 2021-10-12 LAB — COMPREHENSIVE METABOLIC PANEL
ALT: 16 U/L (ref 0–35)
AST: 19 U/L (ref 0–37)
Albumin: 4.1 g/dL (ref 3.5–5.2)
Alkaline Phosphatase: 92 U/L (ref 39–117)
BUN: 9 mg/dL (ref 6–23)
CO2: 31 mEq/L (ref 19–32)
Calcium: 9.5 mg/dL (ref 8.4–10.5)
Chloride: 101 mEq/L (ref 96–112)
Creatinine, Ser: 0.88 mg/dL (ref 0.40–1.20)
GFR: 68.39 mL/min (ref >60.00)
Glucose, Bld: 78 mg/dL (ref 70–99)
Potassium: 4.6 mEq/L (ref 3.5–5.1)
Sodium: 138 mEq/L (ref 135–145)
Total Bilirubin: 0.4 mg/dL (ref 0.2–1.2)
Total Protein: 7.5 g/dL (ref 6.0–8.3)

## 2021-10-12 LAB — FOLATE: Folate: 16.2 ng/mL (ref >5.9)

## 2021-10-12 LAB — VITAMIN B12: Vitamin B-12: 191 pg/mL — ABNORMAL LOW (ref 211–911)

## 2021-10-12 LAB — C-REACTIVE PROTEIN: CRP: <1 mg/dL (ref 0.5–20.0)

## 2021-10-12 LAB — SEDIMENTATION RATE: Sed Rate: 36 mm/hr — ABNORMAL HIGH (ref 0–30)

## 2021-10-12 NOTE — Progress Notes (Signed)
Subjective:    Patient ID: Terri Gray, female    DOB: [birth year], 66 y.o.   MRN: [id number]  Chief Complaint  Patient presents with   Nasal Congestion    Congestion has come back, skin is peeling again. Saw derm and was given Rx, but feels needs a stronger dose. Next appointment is not until 11/24/21. Would like referral to rheumatology, feels the inflammation in all her joints.     HPI Patient was seen today for ongoing concerns.  Patient with joint pain in shoulders R>L, knees, wrist, elbows, hands, and left hip.  Also notes neck pain and cracking noise.  Patient endorses limited motion in bilateral shoulders 2/2 pain.  States it feels like inflammation from within.  Patient seen by Ortho in the past for knee pain, however surgery and injections were suggested with limited eval.  Patient requesting rheumatology referral.  Patient seen by dermatology for skin changes.  Patient with rash of midline upper chest and neck.  Skin appears red with skin peeling and then becomes dark with hypopigmented circular lesions.  Biopsy suggested psoriasis.  Patient given methotrexate 2.5 mg 3 tabs weekly, folic acid 1 mg daily, mometasone cream.  Noticed some improvement but seems like skin changes are returning.  Next Derm appointment 11/24/2021.  Pt notes tenderness of left lateral breast.  Denies nipple inversion, drainage, skin changes, erythema or edema.  Last mammogram 01/14/2021.  Patient with continued intermittent nasal drainage.  Seen by ENT.  Testing was negative.  Advised patient had a hiatal hernia which she already knew about and is on a PPI.  Patient also notes a bitter taste that comes and goes with all foods and liquids.  Past Medical History:  Diagnosis Date   Allergy    Arthritis    Asthma    Chicken pox    Colon polyps    Diabetes mellitus without complication ([facility])    GERD (gastroesophageal reflux disease)    H/O fracture of nose    Broken by ENT   History of colon polyps     Hyperlipidemia    Hypertension    IBS (irritable bowel syndrome)    Migraines    Obesity    Shingles 2012   Sleep apnea    on CPAP machine but uses it 90% of the time because it gives her some complication and does a mouth guard as well   Thyroid disease     Allergies  Allergen Reactions   Gabapentin     Stomach ache    ROS General: Denies fever, chills, night sweats, changes in weight, changes in appetite HEENT: Denies headaches, ear pain, changes in vision, rhinorrhea, sore throat  +bitter taste in mouth CV: Denies CP, palpitations, SOB, orthopnea Pulm: Denies SOB, cough, wheezing GI: Denies abdominal pain, nausea, vomiting, diarrhea, constipation +GERD GU: Denies dysuria, hematuria, frequency, vaginal discharge  +L breast tenderness Msk: Denies muscle cramps +joint pains Neuro: Denies weakness, numbness, tingling Skin: Denies bruising  +peeling skin, psoriasis Psych: Denies depression, anxiety, hallucinations     Objective:    Blood pressure 122/88, pulse 65, temperature 98.4 F (36.9 C), temperature source Oral, weight 200 lb 9.6 oz (91 kg), SpO2 99 %.  Gen. Pleasant, well-nourished, in no distress, normal affect   HEENT: [place]/AT, face symmetric, conjunctiva clear, no scleral icterus, PERRLA, EOMI, nares patent without drainage Lungs: no accessory muscle use, CTAB, no wheezes or rales Cardiovascular: RRR, no m/r/g, no peripheral edema Musculoskeletal: No joint edema.  No deformities, no cyanosis  or clubbing, normal tone Neuro:  A&Ox3, CN II-XII intact, normal gait Skin:  Warm, dry, intact.  Peeling skin, erythema of upper chest   Wt Readings from Last 3 Encounters:  08/17/21 189 lb (85.7 kg)  08/09/21 199 lb (90.3 kg)  07/12/21 200 lb (90.7 kg)    Lab Results  Component Value Date   WBC 6.3 04/01/2021   HGB 11.7 (L) 04/01/2021   HCT 36.1 04/01/2021   PLT 355.0 04/01/2021   GLUCOSE 107 (H) 04/01/2021   CHOL 158 12/03/2019   TRIG 191 (H) 12/03/2019   HDL 44  (L) 12/03/2019   LDLCALC 85 12/03/2019   ALT 14 04/01/2021   AST 20 04/01/2021   NA 136 04/01/2021   K 4.8 04/01/2021   CL 100 04/01/2021   CREATININE 0.87 04/01/2021   BUN 10 04/01/2021   CO2 29 04/01/2021   TSH 0.67 04/01/2021   HGBA1C 6.4 04/01/2021    Assessment/Plan:  Psoriasis  -confirmed by bxp of skin lesion -continue MTX, folate, and mometasone cream per Derm -Rheumatology referral placed  - Plan: Lupus (SLE) Analysis, Sedimentation Rate, C-reactive Protein, CMP, Vitamin B12, Folate, ANA, CYCLIC CITRUL PEPTIDE ANTIBODY, IGG/IGA, Ambulatory referral to Rheumatology  Hiatal hernia -continue PPI -supportive care including wt loss encouraged.  Bitter taste -possibly 2/2 medications vs GERD due to hiatal hernia -continue PPI -GI f/u advised.  Multiple joint pain  -concern for autoimmune cause given hx and recent dx of psoriasis. -continue supportive care -will obtain labs -continue f/u with Derm and Ortho -Rheumatology referral placed. - Plan: Lupus (SLE) Analysis, Sedimentation Rate, C-reactive Protein, CBC with Differential/Platelet, Rheumatoid Factor, ANA, CYCLIC CITRUL PEPTIDE ANTIBODY, IGG/IGA, Ambulatory referral to Rheumatology, DG Shoulder Right  Breast tenderness  -L breast -screening mammogram negative 01/14/21 - Plan: MM DIAG BREAST TOMO BILATERAL  Nasal congestion -chronic -2/2 allergies.  GERD also likely contributing -continue current meds including singular -continue f/u with ENT  - Plan: CBC with Differential/Platelet  Vitamin B 12 deficiency  - Plan: CBC with Differential/Platelet, Vitamin B12  Shoulder stiffness, right  -ongoing -discussed possible causes including OA.  Also consider psoriatic arthritis given hx. -continue supportive treatments including voltaren gel, OTC tylenol arthritis strength, heat, rest, etc. - Plan: DG Shoulder Right, AMB referral to orthopedics  Essential hypertension -Controlled -Continue current  medications -Plan: Metoprolol tartrate 25 mg  Seasonal perennial allergic rhinitis -Continue Singulair -Plan: Singulair  Moderate persistent asthma in adult without complication -Stable -Continue current medications including Symbicort, albuterol inhaler as needed, Singulair -Continue follow-up with pulmonology -Plan: Singulair  F/u prn  [name], MD

## 2021-10-13 ENCOUNTER — Encounter: Payer: Self-pay | Admitting: Emergency Medicine

## 2021-10-13 ENCOUNTER — Ambulatory Visit: Payer: Medicare Other | Admitting: Emergency Medicine

## 2021-10-13 DIAGNOSIS — J3089 Other allergic rhinitis: Secondary | ICD-10-CM | POA: Diagnosis not present

## 2021-10-13 DIAGNOSIS — L409 Psoriasis, unspecified: Secondary | ICD-10-CM

## 2021-10-13 DIAGNOSIS — J4540 Moderate persistent asthma, uncomplicated: Secondary | ICD-10-CM

## 2021-10-13 DIAGNOSIS — K219 Gastro-esophageal reflux disease without esophagitis: Secondary | ICD-10-CM | POA: Diagnosis not present

## 2021-10-13 LAB — RHEUMATOID FACTOR: Rheumatoid fact SerPl-aCnc: <14 IU/mL (ref <14)

## 2021-10-13 LAB — ANA: Anti Nuclear Antibody (ANA): NEGATIVE

## 2021-10-13 NOTE — Assessment & Plan Note (Signed)
She missed the Symbicort when we stopped it.  I think it is important for [place] to try to definitively establish asthma with a methacholine challenge.  If positive then given her eosinophil count, allergic disease she may be a good candidate for biologic therapy.  I briefly talked to her about this today.  She is willing to undergo the test.  For now I will continue the Symbicort, continue her allergy regimen.

## 2021-10-13 NOTE — Progress Notes (Signed)
Subjective:    Patient ID: Terri Gray, female    DOB: 1955/10/07, 66 y.o.   MRN: [id number]  HPI  ROV 08/09/21 --66 year old woman with a history of allergic rhinitis, OSA on CPAP.  She carries a history of asthma but she has reassuring pulmonary function testing in December 2022 with principally restriction, no obstruction.  She deals with exertional dyspnea, cough, allergic rhinitis and congestion, GERD.  In December I tried starting her empirically on Symbicort to see if she would get benefit. On fluticasone nasal, Benadryl, nasal saline rinses, singulair, Nexium. She reports that she continues to have chest and nasal congestion. She will intermittently get a rash on her nose/cheeks, upper chest. She was treated for bronchitis in February w abx. She is on the Symbicort since December - may be helping her some, but not for a full 12 hours. She sometimes uses it more than 2x a day. She uses albuterol about 3 x a day  Since I have seen her she has had reassuring swallowing eval and esophagram Sinus CT scan at [facility] 07/18/21 was reassuring.   ROV 10/13/21 --follow-up visit for 66 year old woman with a history of allergic rhinitis, OSA on CPAP.  I have seen her for dyspnea, nasal congestion and obstruction.  There is been some question of asthma but her pulmonary function testing has not shown obstruction. I asked her to stop Symbicort but she continued it because her breathing worsened off of it.  She has a malar rash, unclear whether this is connected to her respiratory symptoms.  Her dermatology evaluation initially consistent w lichenoid dermatitis, then bx consistent w psoriasis. She has been started on MTX + folate, since 6 weeks ago. It did seem to help her rash. She has seen Dr Verlin Fester w Allergy, not recently. No plan for immunotherpy.  She is on Symbicort, Singulair, Pataday eyedrops, Flonase, Nexium.  Uses Ventolin approximately 1-4x a day.   Labs 5/24: CRP negative, ESR 36,  eosinophils 16.9% with absolute elevated at 1.2, RF negative.  SLE labs and ANA both pending.    Review of Systems As per HPI     Objective:   Physical Exam Vitals:   10/13/21 0855  BP: 136/74  Pulse: 74  Temp: 99.3 F (37.4 C)  TempSrc: Oral  SpO2: 98%  Weight: 200 lb 6.4 oz (90.9 kg)  Height: 5' 6"  (1.676 m)   Gen: Pleasant, overwt woman, in no distress,  normal affect  ENT: No lesions,  mouth clear,  oropharynx clear, she does have nasal congestion and obstruction  Neck: No JVD, no stridor today  Lungs: No use of accessory muscles, no crackles or wheezing on normal respiration, no wheeze on forced expiration  Cardiovascular: RRR, heart sounds normal, no murmur or gallops, no peripheral edema  Musculoskeletal: No deformities, no cyanosis or clubbing  Neuro: alert, awake, non focal  Skin: Warm, no lesions.  She has a hypopigmented rash on her chest.  Her malar rash is improved      Assessment & Plan:  Moderate persistent asthma She missed the Symbicort when we stopped it.  I think it is important for [place] to try to definitively establish asthma with a methacholine challenge.  If positive then given her eosinophil count, allergic disease she may be a good candidate for biologic therapy.  I briefly talked to her about this today.  She is willing to undergo the test.  For now I will continue the Symbicort, continue her allergy regimen.  Seasonal and  perennial allergic rhinitis Continue current allergy regimen  GERD (gastroesophageal reflux disease) Continue Nexium  Psoriasis She tells me that her rash has been biopsied, diagnosed as psoriasis.  Unclear to me whether this is connected to her eosinophilia or her respiratory status.  She has been treated with methotrexate and has follow-up established with dermatology.  Baltazar Apo, MD, PhD 10/13/2021, 9:25 AM [facility] [phone] or if no answer [phone]

## 2021-10-13 NOTE — Assessment & Plan Note (Signed)
She tells me that her rash has been biopsied, diagnosed as psoriasis.  Unclear to me whether this is connected to her eosinophilia or her respiratory status.  She has been treated with methotrexate and has follow-up established with dermatology.

## 2021-10-13 NOTE — Patient Instructions (Signed)
Please continue Symbicort 2 puffs twice a day.  Rinse and gargle after use. Keep albuterol available to use 2 puffs up to every 4 hours if needed for shortness of breath, chest tightness, wheezing.  We will arrange for a methacholine challenge pulmonary function test to further evaluate your suspected asthma. Depending on results we may consider trying biologic therapy to help your asthma, allergies and elevated eosinophil count. Please continue Singulair and Flonase as you have been taking them There may be benefit going forward from repeat evaluation by Dr. Verlin Fester with Allergy Continue Nexium Continue your methotrexate, surveillance lab work, folic acid as directed by Dermatology. Follow Dr. Lamonte Sakai next available after your new pulmonary function testing so we can discuss results and plan next steps.

## 2021-10-13 NOTE — Assessment & Plan Note (Signed)
Continue Nexium 

## 2021-10-13 NOTE — Addendum Note (Signed)
Addended by: Dierdre Highman on: 10/13/2021 09:32 AM   Modules accepted: Orders

## 2021-10-13 NOTE — Assessment & Plan Note (Signed)
Continue current allergy regimen

## 2021-10-15 LAB — LUPUS (SLE) ANALYSIS
Anti Nuclear Antibody (ANA): NEGATIVE
Anti-striation Abs: NEGATIVE
Complement C4, Serum: 61 mg/dL — ABNORMAL HIGH (ref 12–38)
ENA RNP Ab: <0.2 AI (ref 0.0–0.9)
ENA SM Ab Ser-aCnc: <0.2 AI (ref 0.0–0.9)
ENA SSA (RO) Ab: <0.2 AI (ref 0.0–0.9)
ENA SSB (LA) Ab: <0.2 AI (ref 0.0–0.9)
Mitochondrial Ab: <20 Units (ref 0.0–20.0)
Parietal Cell Ab: 2 Units (ref 0.0–20.0)
Scleroderma (Scl-70) (ENA) Antibody, IgG: <0.2 AI (ref 0.0–0.9)
Smooth Muscle Ab: 5 Units (ref 0–19)
Thyroperoxidase Ab SerPl-aCnc: <9 IU/mL (ref 0–34)
dsDNA Ab: 1 IU/mL (ref 0–9)

## 2021-10-15 LAB — CYCLIC CITRUL PEPTIDE ANTIBODY, IGG/IGA: Cyclic Citrullin Peptide Ab: 7 units (ref 0–19)

## 2021-10-19 ENCOUNTER — Other Ambulatory Visit: Payer: Self-pay | Admitting: Family Medicine

## 2021-10-19 DIAGNOSIS — N644 Mastodynia: Secondary | ICD-10-CM

## 2021-10-23 ENCOUNTER — Other Ambulatory Visit: Payer: Self-pay | Admitting: Family Medicine

## 2021-10-23 ENCOUNTER — Encounter: Payer: Self-pay | Admitting: Family Medicine

## 2021-10-23 DIAGNOSIS — K219 Gastro-esophageal reflux disease without esophagitis: Secondary | ICD-10-CM

## 2021-10-23 MED ORDER — METOPROLOL TARTRATE 25 MG PO TABS: 25.0000 mg | ORAL_TABLET | Freq: Two times a day (BID) | ORAL | 3 refills | Status: DC

## 2021-10-23 MED ORDER — MONTELUKAST SODIUM 10 MG PO TABS: 10.0000 mg | ORAL_TABLET | Freq: Every day | ORAL | 3 refills | Status: DC

## 2021-10-25 ENCOUNTER — Ambulatory Visit
Admission: RE | Admit: 2021-10-25 | Discharge: 2021-10-25 | Disposition: A | Discharge status: Home / Self Care | Payer: Medicare Other | Source: Ambulatory Visit | Attending: Family Medicine | Admitting: Family Medicine

## 2021-10-25 DIAGNOSIS — N644 Mastodynia: Secondary | ICD-10-CM

## 2021-10-25 DIAGNOSIS — R928 Other abnormal and inconclusive findings on diagnostic imaging of breast: Secondary | ICD-10-CM | POA: Diagnosis not present

## 2021-11-08 ENCOUNTER — Ambulatory Visit (HOSPITAL_COMMUNITY)
Admission: RE | Admit: 2021-11-08 | Discharge: 2021-11-08 | Disposition: A | Discharge status: Home / Self Care | Payer: Medicare Other | Source: Ambulatory Visit | Attending: Emergency Medicine | Admitting: Emergency Medicine

## 2021-11-08 DIAGNOSIS — J4540 Moderate persistent asthma, uncomplicated: Secondary | ICD-10-CM | POA: Insufficient documentation

## 2021-11-08 LAB — PULMONARY FUNCTION TEST
FEF 25-75 Post: 2.97 L/sec
FEF 25-75 Pre: 2.29 L/sec
FEF2575-%Change-Post: 29 %
FEF2575-%Pred-Post: 151 %
FEF2575-%Pred-Pre: 116 %
FEV1-%Change-Post: 7 %
FEV1-%Pred-Post: 91 %
FEV1-%Pred-Pre: 85 %
FEV1-Post: 2.06 L
FEV1-Pre: 1.92 L
FEV1FVC-%Change-Post: 1 %
FEV1FVC-%Pred-Pre: 105 %
FEV6-%Change-Post: 5 %
FEV6-Post: 2.41 L
FEV6-Pre: 2.29 L
FEV6FVC-%Change-Post: 0 %
FVC-%Change-Post: 5 %
FVC-%Pred-Post: 85 %
FVC-%Pred-Pre: 80 %
FVC-Post: 2.42 L
FVC-Pre: 2.29 L
Post FEV1/FVC ratio: 85 %
Post FEV6/FVC ratio: 100 %
Pre FEV1/FVC ratio: 84 %
Pre FEV6/FVC Ratio: 100 %

## 2021-11-08 MED ORDER — METHACHOLINE 16 MG/ML NEB SOLN
3.0000 mL | Freq: Once | RESPIRATORY_TRACT | Status: DC
  Filled 2021-11-08: qty 8

## 2021-11-08 MED ORDER — SODIUM CHLORIDE 0.9 % IN NEBU
3.0000 mL | INHALATION_SOLUTION | Freq: Once | RESPIRATORY_TRACT | Status: AC
  Administered 2021-11-08: 3 mL via RESPIRATORY_TRACT
  Filled 2021-11-08: qty 3

## 2021-11-08 MED ORDER — METHACHOLINE 1 MG/ML NEB SOLN
3.0000 mL | Freq: Once | RESPIRATORY_TRACT | Status: AC
  Administered 2021-11-08: 3 mg via RESPIRATORY_TRACT
  Filled 2021-11-08: qty 8

## 2021-11-08 MED ORDER — METHACHOLINE 0.0625 MG/ML NEB SOLN
3.0000 mL | Freq: Once | RESPIRATORY_TRACT | Status: AC
  Administered 2021-11-08: 0.1875 mg via RESPIRATORY_TRACT
  Filled 2021-11-08: qty 8

## 2021-11-08 MED ORDER — METHACHOLINE 4 MG/ML NEB SOLN
3.0000 mL | Freq: Once | RESPIRATORY_TRACT | Status: DC
  Filled 2021-11-08: qty 8

## 2021-11-08 MED ORDER — ALBUTEROL SULFATE (2.5 MG/3ML) 0.083% IN NEBU
2.5000 mg | INHALATION_SOLUTION | Freq: Once | RESPIRATORY_TRACT | Status: AC
  Administered 2021-11-08: 2.5 mg via RESPIRATORY_TRACT

## 2021-11-08 MED ORDER — METHACHOLINE 0.25 MG/ML NEB SOLN
3.0000 mL | Freq: Once | RESPIRATORY_TRACT | Status: AC
  Administered 2021-11-08: 0.75 mg via RESPIRATORY_TRACT
  Filled 2021-11-08: qty 8

## 2021-11-10 DIAGNOSIS — Z7984 Long term (current) use of oral hypoglycemic drugs: Secondary | ICD-10-CM | POA: Diagnosis not present

## 2021-11-10 DIAGNOSIS — Z79899 Other long term (current) drug therapy: Secondary | ICD-10-CM | POA: Diagnosis not present

## 2021-11-10 DIAGNOSIS — I10 Essential (primary) hypertension: Secondary | ICD-10-CM | POA: Diagnosis not present

## 2021-11-10 DIAGNOSIS — E1142 Type 2 diabetes mellitus with diabetic polyneuropathy: Secondary | ICD-10-CM | POA: Diagnosis not present

## 2021-11-10 DIAGNOSIS — E785 Hyperlipidemia, unspecified: Secondary | ICD-10-CM | POA: Diagnosis not present

## 2021-11-10 DIAGNOSIS — Z794 Long term (current) use of insulin: Secondary | ICD-10-CM | POA: Diagnosis not present

## 2021-11-10 DIAGNOSIS — E039 Hypothyroidism, unspecified: Secondary | ICD-10-CM | POA: Diagnosis not present

## 2021-11-19 ENCOUNTER — Other Ambulatory Visit: Payer: Self-pay | Admitting: Family Medicine

## 2021-11-19 DIAGNOSIS — M26609 Unspecified temporomandibular joint disorder, unspecified side: Secondary | ICD-10-CM

## 2021-11-24 ENCOUNTER — Ambulatory Visit (INDEPENDENT_AMBULATORY_CARE_PROVIDER_SITE_OTHER): Payer: Medicare Other

## 2021-11-24 DIAGNOSIS — L432 Lichenoid drug reaction: Secondary | ICD-10-CM | POA: Diagnosis not present

## 2021-11-24 DIAGNOSIS — E039 Hypothyroidism, unspecified: Secondary | ICD-10-CM | POA: Diagnosis not present

## 2021-11-24 DIAGNOSIS — E538 Deficiency of other specified B group vitamins: Secondary | ICD-10-CM

## 2021-11-24 DIAGNOSIS — L818 Other specified disorders of pigmentation: Secondary | ICD-10-CM | POA: Diagnosis not present

## 2021-11-24 DIAGNOSIS — Z5181 Encounter for therapeutic drug level monitoring: Secondary | ICD-10-CM | POA: Diagnosis not present

## 2021-11-24 MED ORDER — CYANOCOBALAMIN 1000 MCG/ML IJ SOLN
1000.0000 ug | INTRAMUSCULAR | Status: AC
  Administered 2021-11-24 – 2021-12-22 (×2): 1000 ug via INTRAMUSCULAR

## 2021-11-24 NOTE — Progress Notes (Signed)
Pt here for monthly B12 injection #1 of 3 per Dr Volanda Napoleon.  B12 1059mg given IM left deltoid and pt tolerated injection well.  Next B12 injection scheduled for 12/22/21.

## 2021-11-28 ENCOUNTER — Telehealth: Payer: Self-pay | Admitting: Family Medicine

## 2021-11-28 NOTE — Telephone Encounter (Signed)
Pt has rash, asks if you can change medication metoprolol tartrate (LOPRESSOR) 25 MG tablet thinking this may be cause of rash

## 2021-12-15 NOTE — Telephone Encounter (Signed)
Would advise patient to schedule follow-up.

## 2021-12-16 NOTE — Telephone Encounter (Signed)
Schedule office visit with patient for 12/26/2021

## 2021-12-22 ENCOUNTER — Ambulatory Visit (INDEPENDENT_AMBULATORY_CARE_PROVIDER_SITE_OTHER): Payer: Medicare Other

## 2021-12-22 DIAGNOSIS — E538 Deficiency of other specified B group vitamins: Secondary | ICD-10-CM | POA: Diagnosis not present

## 2021-12-22 NOTE — Progress Notes (Signed)
Per orders of Dr. Volanda Napoleon, injection of VitB 12 1032mg given by KEncarnacion Slates Patient tolerated injection well.   Patient is due for VitB12 injection next month.

## 2021-12-26 ENCOUNTER — Ambulatory Visit (INDEPENDENT_AMBULATORY_CARE_PROVIDER_SITE_OTHER): Payer: Medicare Other | Admitting: Family Medicine

## 2021-12-26 VITALS — BP 122/74 | HR 79 | Temp 98.2°F | Wt 207.6 lb

## 2021-12-26 DIAGNOSIS — M25611 Stiffness of right shoulder, not elsewhere classified: Secondary | ICD-10-CM | POA: Diagnosis not present

## 2021-12-26 DIAGNOSIS — L408 Other psoriasis: Secondary | ICD-10-CM | POA: Diagnosis not present

## 2021-12-26 DIAGNOSIS — R0981 Nasal congestion: Secondary | ICD-10-CM | POA: Diagnosis not present

## 2021-12-26 DIAGNOSIS — R682 Dry mouth, unspecified: Secondary | ICD-10-CM | POA: Diagnosis not present

## 2021-12-26 DIAGNOSIS — M2550 Pain in unspecified joint: Secondary | ICD-10-CM

## 2021-12-26 DIAGNOSIS — I10 Essential (primary) hypertension: Secondary | ICD-10-CM

## 2021-12-26 MED ORDER — IRBESARTAN 75 MG PO TABS: 75.0000 mg | ORAL_TABLET | Freq: Every day | ORAL | 3 refills | Status: DC

## 2021-12-26 NOTE — Progress Notes (Signed)
Subjective:    Patient ID: Terri Gray, female    DOB: February 13, 1956, 66 y.o.   MRN: [id number]  Chief Complaint  Patient presents with   Medication Reaction    Derm thinks the BP med is a trigger for the congestion tat turns into inflammation of skin.     HPI Patient was seen today for follow-up on several concerns.  Patient was seen by dermatology for ongoing rash.  Patient states rash continues to appear as if her skin is burned.  Concern blood pressure medication metoprolol may be causing symptoms.  Patient interested in switching BP meds.  In the past lisinopril caused bloating.  Patient has a rheumatology appointment December 6, for h/o RA.  Was inquiring if she could be seen sooner.  Continuing to have dry mouth, joint pain in shoulders, ankles, toes, knees.  Patient endorses continued congestion.  States "feels like getting an internal fever".  May have temperature of 99 F.  Takes Aleve.  Patient inquires about referral to [place].  Past Medical History:  Diagnosis Date   Allergy    Arthritis    Asthma    Chicken pox    Colon polyps    Diabetes mellitus without complication (HCC)    GERD (gastroesophageal reflux disease)    H/O fracture of nose    Broken by ENT   History of colon polyps    Hyperlipidemia    Hypertension    IBS (irritable bowel syndrome)    Migraines    Obesity    Shingles 2012   Sleep apnea    on CPAP machine but uses it 90% of the time because it gives her some complication and does a mouth guard as well   Thyroid disease     Allergies  Allergen Reactions   Gabapentin     Stomach ache    ROS General: Denies fever, chills, night sweats, changes in weight, changes in appetite HEENT: Denies headaches, ear pain, changes in vision, rhinorrhea, sore throat + nasal congestion, dry mouth CV: Denies CP, palpitations, SOB, orthopnea Pulm: Denies SOB, cough, wheezing GI: Denies abdominal pain, nausea, vomiting, diarrhea, constipation +  GERD GU: Denies dysuria, hematuria, frequency, vaginal discharge Msk: Denies muscle cramps +joint pains Neuro: Denies weakness, numbness, tingling Skin: Denies bruising + rash Psych: Denies depression, anxiety, hallucinations     Objective:    Blood pressure 122/74, pulse 79, temperature 98.2 F (36.8 C), temperature source Oral, weight 207 lb 9.6 oz (94.2 kg), SpO2 98 %.  Gen. Pleasant, well-nourished, in no distress, normal affect   HEENT: [place]/AT, face symmetric, conjunctiva clear, no scleral icterus, PERRLA, EOMI, no TTP of sinuses, nares patent with clear drainage, pharynx without erythema or exudate. Lungs: no accessory muscle use, CTAB, no wheezes or rales Cardiovascular: RRR, no m/r/g, no peripheral edema Musculoskeletal: No edema, no deformities, no cyanosis or clubbing, normal tone Neuro:  A&Ox3, CN II-XII intact, normal gait Skin:  Warm, dry, intact.  Hyper and hypopigmentation of upper chest around neck, mild erythema    Wt Readings from Last 3 Encounters:  12/26/21 207 lb 9.6 oz (94.2 kg)  10/13/21 200 lb 6.4 oz (90.9 kg)  10/12/21 200 lb 9.6 oz (91 kg)    Lab Results  Component Value Date   WBC 6.9 10/12/2021   HGB 12.2 10/12/2021   HCT 36.9 10/12/2021   PLT 358.0 10/12/2021   GLUCOSE 78 10/12/2021   CHOL 158 12/03/2019   TRIG 191 (H) 12/03/2019   HDL 44 (  L) 12/03/2019   LDLCALC 85 12/03/2019   ALT 16 10/12/2021   AST 19 10/12/2021   NA 138 10/12/2021   K 4.6 10/12/2021   CL 101 10/12/2021   CREATININE 0.88 10/12/2021   BUN 9 10/12/2021   CO2 31 10/12/2021   TSH 0.67 04/01/2021   HGBA1C 6.4 04/01/2021    Assessment/Plan:  Essential hypertension -Controlled -Will discontinue metoprolol 25 mg twice daily to see if symptoms improve -In the past lisinopril caused bloating -Start irbesartan 75 mg daily - Plan: irbesartan (AVAPRO) 75 MG tablet  Inverse psoriasis  - Plan: Ambulatory referral to Rheumatology  Multiple joint pain  -History of RA,  however recent RF negative. - Plan: Ambulatory referral to Orthopedic Surgery, Ambulatory referral to Rheumatology  Dry mouth -Discussed possible causes including medications, autoimmune disorders, thyroid dysfunction.  - Plan: Ambulatory referral to Rheumatology  Nasal congestion -likely multifactorial including history of severe GERD, seasonal allergies.  Also consider medications. -Continue supportive care  Shoulder stiffness, right -Possibly 2/2 autoimmune disorder, OA  - Plan: Ambulatory referral to Orthopedic Surgery, Ambulatory referral to Rheumatology  F/u in 4-6 weeks, sooner if needed  [name], MD

## 2021-12-26 NOTE — Patient Instructions (Signed)
I have placed 2 new referrals 1 for rheumatology and the other for Ortho at [facility].  It appears that the Ortho referral was changed to [facility] but I do not see any notes regarding whether they tried to contact you.  A prescription for irbesartan 75 mg was sent to your pharmacy.  Stop taking metoprolol 25 mg twice a day to see if you notice improvement in symptoms.

## 2022-01-05 DIAGNOSIS — M7581 Other shoulder lesions, right shoulder: Secondary | ICD-10-CM | POA: Diagnosis not present

## 2022-01-06 DIAGNOSIS — Z7409 Other reduced mobility: Secondary | ICD-10-CM | POA: Diagnosis not present

## 2022-01-06 DIAGNOSIS — M7581 Other shoulder lesions, right shoulder: Secondary | ICD-10-CM | POA: Diagnosis not present

## 2022-01-06 DIAGNOSIS — M7541 Impingement syndrome of right shoulder: Secondary | ICD-10-CM | POA: Diagnosis not present

## 2022-01-06 DIAGNOSIS — R29898 Other symptoms and signs involving the musculoskeletal system: Secondary | ICD-10-CM | POA: Diagnosis not present

## 2022-01-06 DIAGNOSIS — Z789 Other specified health status: Secondary | ICD-10-CM | POA: Diagnosis not present

## 2022-01-06 DIAGNOSIS — M25611 Stiffness of right shoulder, not elsewhere classified: Secondary | ICD-10-CM | POA: Diagnosis not present

## 2022-01-11 DIAGNOSIS — M25611 Stiffness of right shoulder, not elsewhere classified: Secondary | ICD-10-CM | POA: Diagnosis not present

## 2022-01-11 DIAGNOSIS — R29898 Other symptoms and signs involving the musculoskeletal system: Secondary | ICD-10-CM | POA: Diagnosis not present

## 2022-01-11 DIAGNOSIS — Z7409 Other reduced mobility: Secondary | ICD-10-CM | POA: Diagnosis not present

## 2022-01-11 DIAGNOSIS — M7581 Other shoulder lesions, right shoulder: Secondary | ICD-10-CM | POA: Diagnosis not present

## 2022-01-11 DIAGNOSIS — M7541 Impingement syndrome of right shoulder: Secondary | ICD-10-CM | POA: Diagnosis not present

## 2022-01-11 DIAGNOSIS — Z789 Other specified health status: Secondary | ICD-10-CM | POA: Diagnosis not present

## 2022-01-16 ENCOUNTER — Encounter: Payer: Self-pay | Admitting: Family Medicine

## 2022-01-17 ENCOUNTER — Telehealth: Payer: Self-pay | Admitting: Pharmacist

## 2022-01-17 NOTE — Chronic Care Management (AMB) (Signed)
Chronic Care Management Pharmacy Assistant   Name: Terri Gray  MRN: [id number] DOB: Mar 11, 1956  Reason for Encounter: Disease State   Conditions to be addressed/monitored: HTN per MP  Recent office visits:  12/26/21 [name], MD - Patient presented for essential hypertension and other concerns. Prescribed irbesartan 75 mg. Stopped Metoprolol Tartrate 25 mg.   12/22/21 Moyun, Renold Don, [place] - Patient presented for B-12 Injection.  11/24/21 Elza Rafter D, RN - Patient presented for B-12 Injection.  10/12/21 [name], MD - Patient presented for Psoriasis and other concerns. No medication changes.  08/17/21 Criselda Peaches, LPN - Patient presented for Medicare Annual Wellness Exam. No medication changes.   Recent consult visits:  01/11/22 Flowers, Merwyn Katos PA-C - Patient presented for Rotator Cuff tendinitis right. No medication changes noted.  01/06/22 Creta Levin (PT) - Patient presented for Rotator Cuff Tendinitiis right and other concerns. No other visit details available.  01/05/22 Flowers, Merwyn Katos PA-C - Patient presented for Rotator Cuff tendinitis right. No medication changes noted.  11/24/21 Lavell Luster I (Dermatology) - Patient presented for Lichenoid drug reaction and other concerns. No other visit details available.  11/10/21 Irven Easterly., MD (Endo) - Patient presented for Type 2 diabetes mellitus with diabetic polyneuropathy and other concerns. No medication changes.  11/08/21 Collene Gobble, MD - Patient presented to [facility] for Methacholine Challenge.  10/13/21 Collene Gobble, MD (Pulm) - Patient presented for Moderate persistent asthma and other concerns. No medication changes.  08/18/21 Hiram Comber, NP (Derm) - Patient presented for Other eczema and other concerns. No medication changes.  08/09/21 Collene Gobble, MD (Pulm) - Patient presented for Moderate persistent asthma and  other concerns. No medication changes  Hospital visits:  None in previous 6 months  Medications: Outpatient Encounter Medications as of 01/17/2022  Medication Sig Note   albuterol (VENTOLIN HFA) 108 (90 Base) MCG/ACT inhaler Inhale 2 puffs into the lungs every 6 (six) hours as needed for wheezing or shortness of breath.    budesonide-formoterol (SYMBICORT) 160-4.5 MCG/ACT inhaler Inhale 2 puffs into the lungs in the morning and at bedtime.    cyclobenzaprine (FLEXERIL) 10 MG tablet TAKE ONE TABLET BY MOUTH THREE TIMES A DAY AS NEEDED FOR MUSCLE SPASMS    esomeprazole (NEXIUM) 40 MG capsule TAKE ONE CAPSULE BY MOUTH TWICE A DAY FOR 4 WEEKS, THEN TAKE ONE CAPSULE BY MOUTH DAILY    ferrous sulfate 325 (65 FE) MG tablet Take one tablet by mouth every other day, with a full glass of water.    fluticasone (FLONASE) 50 MCG/ACT nasal spray Place 1-2 sprays into both nostrils daily as needed.    folic acid (FOLVITE) 1 MG tablet Take 1 mg by mouth daily.    folic acid (FOLVITE) 1 MG tablet Take 1 tablet by mouth daily.    glucose blood (TRUE METRIX BLOOD GLUCOSE TEST) test strip 1 each by Other route 4 (four) times daily as needed for other. And lancets 2/day    hydrocortisone valerate ointment (WESTCORT) 0.2 % Apply 1 application topically 2 (two) times daily.    ibuprofen (ADVIL) 200 MG tablet Take 200 mg by mouth every 8 (eight) hours as needed.    irbesartan (AVAPRO) 75 MG tablet Take 1 tablet (75 mg total) by mouth daily.    LANTUS SOLOSTAR 100 UNIT/ML Solostar Pen INJECT 85 UNITS SUBCUTANEOUSLY ONCE DAILY IN THE MORNING 07/27/2020: Take 45 units  levothyroxine (SYNTHROID) 75 MCG tablet Take 1 tablet (75 mcg total) by mouth daily before breakfast.    metFORMIN (GLUCOPHAGE) 1000 MG tablet Take 1 tablet (1,000 mg total) by mouth 2 (two) times daily with a meal.    methotrexate (RHEUMATREX) 2.5 MG tablet Take by mouth.    mometasone (ELOCON) 0.1 % cream Apply topically.    montelukast (SINGULAIR)  10 MG tablet Take 1 tablet (10 mg total) by mouth at bedtime.    Olopatadine HCl (PATADAY) 0.2 % SOLN Place 1 drop into both eyes daily as needed.    Semaglutide,0.25 or 0.5MG/DOS, 2 MG/1.5ML SOPN Inject into the skin.    simvastatin (ZOCOR) 40 MG tablet Take 1 tablet (40 mg total) by mouth daily.    Facility-Administered Encounter Medications as of 01/17/2022  Medication   cyanocobalamin ((VITAMIN B-12)) injection 1,000 mcg  Reviewed chart prior to disease state call. Spoke with patient regarding BP  Recent Office Vitals: BP Readings from Last 3 Encounters:  12/26/21 122/74  10/13/21 136/74  10/12/21 122/88   Pulse Readings from Last 3 Encounters:  12/26/21 79  10/13/21 74  10/12/21 65    Wt Readings from Last 3 Encounters:  12/26/21 207 lb 9.6 oz (94.2 kg)  10/13/21 200 lb 6.4 oz (90.9 kg)  10/12/21 200 lb 9.6 oz (91 kg)     Kidney Function Lab Results  Component Value Date/Time   CREATININE 0.88 10/12/2021 09:03 AM   CREATININE 0.87 04/01/2021 09:01 AM   CREATININE 0.82 12/03/2019 09:34 AM   GFR 68.39 10/12/2021 09:03 AM   GFRNONAA 81 02/05/2020 08:48 AM   GFRAA 93 02/05/2020 08:48 AM       Latest Ref Rng & Units 10/12/2021    9:03 AM 04/01/2021    9:01 AM 02/05/2020    8:48 AM  BMP  Glucose 70 - 99 mg/dL 78  107  169   BUN 6 - 23 mg/dL 9  10  9    Creatinine 0.40 - 1.20 mg/dL 0.88  0.87  0.78   BUN/Creat Ratio 12 - 28   12   Sodium 135 - 145 mEq/L 138  136  137   Potassium 3.5 - 5.1 mEq/L 4.6  4.8  4.6   Chloride 96 - 112 mEq/L 101  100  101   CO2 19 - 32 mEq/L 31  29  21    Calcium 8.4 - 10.5 mg/dL 9.5  9.5  9.2     Current antihypertensive regimen:  Metoprolol tartrate 25 mg 1 tablet twice daily Irbesartan 75 mg once daily How often are you checking your Blood Pressure? weekly Current home BP readings: 120/78 What recent interventions/DTPs have been made by any provider to improve Blood Pressure control since last CPP Visit: Patient has recently started on  Irbesartan Any recent hospitalizations or ED visits since last visit with CPP? No Patient reports she has not noticed any changes since starting the medication and her pressures have remained around 120/78. She reports she has just returned from [place] and has some swelling around her knee and back of leg that has gone down a little after elevating and resting last night. She reports she has requested an referral to Orthopedics at [facility] as she does not care for providers at [facility] and it is closer to her but she had not heard anything from them as far as scheduling. Advised her the referral has been sent to Dr Hervey Ard and advised her of the phone number to the office to  call. Patient further requested I ask to see if Dr Volanda Napoleon would refill her methotraxate once for her to hold her over until she is able to make an appointment with Rheumatology and she would like that referral also sent to [facility]. She reports her [facility] Mccallum will not fill for her until she sees Rheumatology. Message forwarded to office for referral change and refill.  Adherence Review: Is the patient currently on ACE/ARB medication? Yes Does the patient have >5 day gap between last estimated fill dates? No  01/27/22 Received call from patient she reports she has called to Dr Vincent Peyer office and was advised he only works on Emergency planning/management officer, she inquired if his name may be removed from the referral and or it be resent as she is having issues with her knee. Message forwarded to PCP office for assistance.   Care Gaps: Foot Exam - Overdue Zoster Vaccine - Overdue COVID Booster - Overdue Eye Exam - Overdue HGB A1C - Overdue Flu Vaccine - Overdue Lab Results  Component Value Date   HGBA1C 6.4 04/01/2021    Star Rating Drugs: Irbesartan 75 mg - Last filled 12/26/21 30 DS at [facility] Metformin 1000 mg -  Last filled 11/10/21 90 DS at [place] 2 mg/3ML - Last filled 11/10/21 28 DS at [facility] Simvastatin 40 mg - Last filled  6/23 23 90 DS at [place] Pharmacist Assistant [phone]

## 2022-01-27 DIAGNOSIS — M7541 Impingement syndrome of right shoulder: Secondary | ICD-10-CM | POA: Diagnosis not present

## 2022-01-27 DIAGNOSIS — M7581 Other shoulder lesions, right shoulder: Secondary | ICD-10-CM | POA: Diagnosis not present

## 2022-01-27 DIAGNOSIS — Z7409 Other reduced mobility: Secondary | ICD-10-CM | POA: Diagnosis not present

## 2022-01-27 DIAGNOSIS — R29898 Other symptoms and signs involving the musculoskeletal system: Secondary | ICD-10-CM | POA: Diagnosis not present

## 2022-01-27 DIAGNOSIS — M25611 Stiffness of right shoulder, not elsewhere classified: Secondary | ICD-10-CM | POA: Diagnosis not present

## 2022-01-27 DIAGNOSIS — Z789 Other specified health status: Secondary | ICD-10-CM | POA: Diagnosis not present

## 2022-01-30 ENCOUNTER — Ambulatory Visit: Payer: Medicare Other

## 2022-02-01 ENCOUNTER — Ambulatory Visit (INDEPENDENT_AMBULATORY_CARE_PROVIDER_SITE_OTHER): Payer: Medicare Other

## 2022-02-01 DIAGNOSIS — E538 Deficiency of other specified B group vitamins: Secondary | ICD-10-CM

## 2022-02-01 MED ORDER — CYANOCOBALAMIN 1000 MCG/ML IJ SOLN
1000.0000 ug | Freq: Once | INTRAMUSCULAR | Status: AC
  Administered 2022-02-01: 1000 ug via INTRAMUSCULAR

## 2022-02-01 NOTE — Progress Notes (Signed)
Pt here for monthly B12 injection per Dr Volanda Napoleon.  B12 1029mg given IM, and pt tolerated injection well.

## 2022-02-02 DIAGNOSIS — M25561 Pain in right knee: Secondary | ICD-10-CM | POA: Diagnosis not present

## 2022-02-02 DIAGNOSIS — M19011 Primary osteoarthritis, right shoulder: Secondary | ICD-10-CM | POA: Diagnosis not present

## 2022-02-02 DIAGNOSIS — G8929 Other chronic pain: Secondary | ICD-10-CM | POA: Diagnosis not present

## 2022-02-02 NOTE — Progress Notes (Signed)
Per MP Methotraxate has been discontinued by Derm per notes so PCP not willing to fill. Call to patient to advise, she reports that is ok and thanks for asking.  Also notified her that the office is working on the referrals for her and she was in agreement.  [place] Clinical Pharmacist Assistant [phone]

## 2022-02-04 DIAGNOSIS — M19011 Primary osteoarthritis, right shoulder: Secondary | ICD-10-CM | POA: Diagnosis not present

## 2022-02-08 ENCOUNTER — Other Ambulatory Visit: Payer: Self-pay | Admitting: Family Medicine

## 2022-02-08 ENCOUNTER — Encounter: Payer: Self-pay | Admitting: *Deleted

## 2022-02-08 DIAGNOSIS — G8929 Other chronic pain: Secondary | ICD-10-CM | POA: Diagnosis not present

## 2022-02-08 DIAGNOSIS — Z7409 Other reduced mobility: Secondary | ICD-10-CM | POA: Diagnosis not present

## 2022-02-08 DIAGNOSIS — M26609 Unspecified temporomandibular joint disorder, unspecified side: Secondary | ICD-10-CM

## 2022-02-08 DIAGNOSIS — M25611 Stiffness of right shoulder, not elsewhere classified: Secondary | ICD-10-CM | POA: Diagnosis not present

## 2022-02-08 DIAGNOSIS — M25561 Pain in right knee: Secondary | ICD-10-CM | POA: Diagnosis not present

## 2022-02-08 DIAGNOSIS — Z789 Other specified health status: Secondary | ICD-10-CM | POA: Diagnosis not present

## 2022-02-08 DIAGNOSIS — M7581 Other shoulder lesions, right shoulder: Secondary | ICD-10-CM | POA: Diagnosis not present

## 2022-02-08 DIAGNOSIS — R29898 Other symptoms and signs involving the musculoskeletal system: Secondary | ICD-10-CM | POA: Diagnosis not present

## 2022-02-08 DIAGNOSIS — M7541 Impingement syndrome of right shoulder: Secondary | ICD-10-CM | POA: Diagnosis not present

## 2022-02-08 NOTE — Progress Notes (Addendum)
[facility] Quality Team Note  Name: Terri Gray Date of Birth: Jan 17, 1956 MRN: [id number] Date: 02/08/2022  [facility] Quality Team has reviewed this patient's chart, please see recommendations below:  [facility] Quality Other; (KED: Kidney Health Evaluation Gap- Patient needs Urine Albumin Creatinine Ratio Test completed for gap closure. EGFR has already been completed, Patient has upcoming appointment with your practice on 02/20/2022.)

## 2022-02-15 DIAGNOSIS — M50123 Cervical disc disorder at C6-C7 level with radiculopathy: Secondary | ICD-10-CM | POA: Diagnosis not present

## 2022-02-15 DIAGNOSIS — Z789 Other specified health status: Secondary | ICD-10-CM | POA: Diagnosis not present

## 2022-02-15 DIAGNOSIS — Z7409 Other reduced mobility: Secondary | ICD-10-CM | POA: Diagnosis not present

## 2022-02-15 DIAGNOSIS — R29898 Other symptoms and signs involving the musculoskeletal system: Secondary | ICD-10-CM | POA: Diagnosis not present

## 2022-02-15 DIAGNOSIS — M7541 Impingement syndrome of right shoulder: Secondary | ICD-10-CM | POA: Diagnosis not present

## 2022-02-20 ENCOUNTER — Other Ambulatory Visit: Payer: Self-pay

## 2022-02-20 ENCOUNTER — Other Ambulatory Visit (HOSPITAL_COMMUNITY)
Admission: RE | Admit: 2022-02-20 | Discharge: 2022-02-20 | Disposition: A | Discharge status: Home / Self Care | Payer: Medicare Other | Attending: Family Medicine | Admitting: Family Medicine

## 2022-02-20 ENCOUNTER — Ambulatory Visit (INDEPENDENT_AMBULATORY_CARE_PROVIDER_SITE_OTHER): Payer: Medicare Other | Admitting: Family Medicine

## 2022-02-20 VITALS — BP 120/84 | HR 72 | Temp 98.7°F | Wt 202.2 lb

## 2022-02-20 DIAGNOSIS — I10 Essential (primary) hypertension: Secondary | ICD-10-CM

## 2022-02-20 DIAGNOSIS — J3489 Other specified disorders of nose and nasal sinuses: Secondary | ICD-10-CM | POA: Insufficient documentation

## 2022-02-20 DIAGNOSIS — L409 Psoriasis, unspecified: Secondary | ICD-10-CM | POA: Diagnosis not present

## 2022-02-20 NOTE — Progress Notes (Signed)
Subjective:    Patient ID: Terri Gray, female    DOB: 1956-03-16, 66 y.o.   MRN: [id number]  Chief Complaint  Patient presents with   Follow-up    HPI Patient was seen today for f/u.  Pt has not noticed a difference in skin since changing from metoprolol to irbesartan.  Bp still controlled.  Previously seen by Derm for psoriasis.  Was on MTX but advised to see Rheum.  Pt waiting to hear back about Rheumatology referral.  Was open to traveling to [place] for appt.  Pt made her own referral to ortho of shoulder and R knee pain.  Still having constant rhinorrhea.  Pt feels there is infection going on in her body.  Seen by Pulmonology and Endo.  Past Medical History:  Diagnosis Date   Allergy    Arthritis    Asthma    Chicken pox    Colon polyps    Diabetes mellitus without complication (HCC)    GERD (gastroesophageal reflux disease)    H/O fracture of nose    Broken by ENT   History of colon polyps    Hyperlipidemia    Hypertension    IBS (irritable bowel syndrome)    Migraines    Obesity    Shingles 2012   Sleep apnea    on CPAP machine but uses it 90% of the time because it gives her some complication and does a mouth guard as well   Thyroid disease     Allergies  Allergen Reactions   Gabapentin     Stomach ache    ROS General: Denies fever, chills, night sweats, changes in weight, changes in appetite HEENT: Denies headaches, ear pain, changes in vision, sore throat +rhinorrhea CV: Denies CP, palpitations, SOB, orthopnea Pulm: Denies SOB, cough, wheezing GI: Denies abdominal pain, nausea, vomiting, diarrhea, constipation +gerd GU: Denies dysuria, hematuria, frequency, vaginal discharge Msk: Denies muscle cramps, joint pains Neuro: Denies weakness, numbness, tingling Skin: Denies rashes, bruising   +rash on chest and face Psych: Denies depression, anxiety, hallucinations      Objective:    Blood pressure 120/84, pulse 72, temperature 98.7 F (37.1 C),  temperature source Oral, weight 202 lb 3.2 oz (91.7 kg), SpO2 99 %.   Gen. Pleasant, well-nourished, in no distress, normal affect   HEENT: [place]/AT, face symmetric, conjunctiva clear, no scleral icterus, PERRLA, EOMI, nares patent without drainage, pharynx without erythema or exudate.   Lungs: no accessory muscle use, CTAB, no wheezes or rales Cardiovascular: RRR, no m/r/g, no peripheral edema Musculoskeletal: No deformities, no cyanosis or clubbing, normal tone Neuro:  A&Ox3, CN II-XII intact, normal gait Skin:  Warm, no lesions/ rash   Wt Readings from Last 3 Encounters:  02/20/22 202 lb 3.2 oz (91.7 kg)  12/26/21 207 lb 9.6 oz (94.2 kg)  10/13/21 200 lb 6.4 oz (90.9 kg)    Lab Results  Component Value Date   WBC 6.9 10/12/2021   HGB 12.2 10/12/2021   HCT 36.9 10/12/2021   PLT 358.0 10/12/2021   GLUCOSE 78 10/12/2021   CHOL 158 12/03/2019   TRIG 191 (H) 12/03/2019   HDL 44 (L) 12/03/2019   LDLCALC 85 12/03/2019   ALT 16 10/12/2021   AST 19 10/12/2021   NA 138 10/12/2021   K 4.6 10/12/2021   CL 101 10/12/2021   CREATININE 0.88 10/12/2021   BUN 9 10/12/2021   CO2 31 10/12/2021   TSH 0.67 04/01/2021   HGBA1C 6.4 04/01/2021    Assessment/Plan:  Essential hypertension -controlled -metoprolol 25 mg BID d/c'd last OFV per pt request as thought to be contributing to skin changes.  Continued skin symptoms since d/c'ing med. -continue irbesartan 75 mg daily -continue lifestyle modifications  Rhinorrhea -h/o severe allergic rhinitis -will obtain cx as pt concerned for infection -continue current meds -continue f/u with pulmonology  - Plan: Nasal culture  Psoriasis -continue f/u with Derm -awaiting referral for Rheum  F/u prn  [name], MD

## 2022-02-22 ENCOUNTER — Other Ambulatory Visit: Payer: Self-pay | Admitting: Family Medicine

## 2022-02-22 ENCOUNTER — Ambulatory Visit: Payer: Medicare Other | Admitting: Family Medicine

## 2022-02-22 DIAGNOSIS — K219 Gastro-esophageal reflux disease without esophagitis: Secondary | ICD-10-CM

## 2022-02-22 LAB — NASOPHARYNGEAL CULTURE: Culture: NORMAL

## 2022-02-23 ENCOUNTER — Ambulatory Visit: Payer: Medicare Other | Admitting: Family Medicine

## 2022-02-28 DIAGNOSIS — Z7409 Other reduced mobility: Secondary | ICD-10-CM | POA: Diagnosis not present

## 2022-02-28 DIAGNOSIS — R29898 Other symptoms and signs involving the musculoskeletal system: Secondary | ICD-10-CM | POA: Diagnosis not present

## 2022-02-28 DIAGNOSIS — G8929 Other chronic pain: Secondary | ICD-10-CM | POA: Diagnosis not present

## 2022-02-28 DIAGNOSIS — Z789 Other specified health status: Secondary | ICD-10-CM | POA: Diagnosis not present

## 2022-02-28 DIAGNOSIS — M25561 Pain in right knee: Secondary | ICD-10-CM | POA: Diagnosis not present

## 2022-02-28 DIAGNOSIS — M7541 Impingement syndrome of right shoulder: Secondary | ICD-10-CM | POA: Diagnosis not present

## 2022-02-28 DIAGNOSIS — M50123 Cervical disc disorder at C6-C7 level with radiculopathy: Secondary | ICD-10-CM | POA: Diagnosis not present

## 2022-03-01 DIAGNOSIS — L818 Other specified disorders of pigmentation: Secondary | ICD-10-CM | POA: Diagnosis not present

## 2022-03-01 DIAGNOSIS — J3089 Other allergic rhinitis: Secondary | ICD-10-CM | POA: Diagnosis not present

## 2022-03-01 DIAGNOSIS — L432 Lichenoid drug reaction: Secondary | ICD-10-CM | POA: Diagnosis not present

## 2022-03-08 DIAGNOSIS — Z789 Other specified health status: Secondary | ICD-10-CM | POA: Diagnosis not present

## 2022-03-08 DIAGNOSIS — R29898 Other symptoms and signs involving the musculoskeletal system: Secondary | ICD-10-CM | POA: Diagnosis not present

## 2022-03-08 DIAGNOSIS — M25611 Stiffness of right shoulder, not elsewhere classified: Secondary | ICD-10-CM | POA: Diagnosis not present

## 2022-03-08 DIAGNOSIS — M7581 Other shoulder lesions, right shoulder: Secondary | ICD-10-CM | POA: Diagnosis not present

## 2022-03-08 DIAGNOSIS — G8929 Other chronic pain: Secondary | ICD-10-CM | POA: Diagnosis not present

## 2022-03-08 DIAGNOSIS — M25561 Pain in right knee: Secondary | ICD-10-CM | POA: Diagnosis not present

## 2022-03-08 DIAGNOSIS — M50123 Cervical disc disorder at C6-C7 level with radiculopathy: Secondary | ICD-10-CM | POA: Diagnosis not present

## 2022-03-08 DIAGNOSIS — Z7409 Other reduced mobility: Secondary | ICD-10-CM | POA: Diagnosis not present

## 2022-03-08 DIAGNOSIS — M7541 Impingement syndrome of right shoulder: Secondary | ICD-10-CM | POA: Diagnosis not present

## 2022-03-12 ENCOUNTER — Encounter: Payer: Self-pay | Admitting: Family Medicine

## 2022-03-15 DIAGNOSIS — Z7409 Other reduced mobility: Secondary | ICD-10-CM | POA: Diagnosis not present

## 2022-03-15 DIAGNOSIS — R29898 Other symptoms and signs involving the musculoskeletal system: Secondary | ICD-10-CM | POA: Diagnosis not present

## 2022-03-15 DIAGNOSIS — Z789 Other specified health status: Secondary | ICD-10-CM | POA: Diagnosis not present

## 2022-03-15 DIAGNOSIS — M50123 Cervical disc disorder at C6-C7 level with radiculopathy: Secondary | ICD-10-CM | POA: Diagnosis not present

## 2022-03-15 DIAGNOSIS — M7581 Other shoulder lesions, right shoulder: Secondary | ICD-10-CM | POA: Diagnosis not present

## 2022-03-15 DIAGNOSIS — M25611 Stiffness of right shoulder, not elsewhere classified: Secondary | ICD-10-CM | POA: Diagnosis not present

## 2022-03-15 DIAGNOSIS — M25561 Pain in right knee: Secondary | ICD-10-CM | POA: Diagnosis not present

## 2022-03-15 DIAGNOSIS — M7541 Impingement syndrome of right shoulder: Secondary | ICD-10-CM | POA: Diagnosis not present

## 2022-03-15 DIAGNOSIS — G8929 Other chronic pain: Secondary | ICD-10-CM | POA: Diagnosis not present

## 2022-03-16 DIAGNOSIS — M5412 Radiculopathy, cervical region: Secondary | ICD-10-CM | POA: Diagnosis not present

## 2022-03-17 ENCOUNTER — Other Ambulatory Visit: Payer: Self-pay | Admitting: Family Medicine

## 2022-03-17 DIAGNOSIS — J4542 Moderate persistent asthma with status asthmaticus: Secondary | ICD-10-CM

## 2022-03-17 DIAGNOSIS — K219 Gastro-esophageal reflux disease without esophagitis: Secondary | ICD-10-CM

## 2022-03-21 ENCOUNTER — Telehealth: Payer: Self-pay

## 2022-03-21 DIAGNOSIS — E1142 Type 2 diabetes mellitus with diabetic polyneuropathy: Secondary | ICD-10-CM

## 2022-03-21 NOTE — Telephone Encounter (Signed)
From [facility]: KED: Kidney Health Evaluation Gap- Patient needs Urine Albumin Creatinine Ratio Test completed for gap closure. EGFR has already been completed, Patient has upcoming appointment with your practice on 02/20/2022  Pt notified of above. Lab scheduled for 03/27/22

## 2022-03-27 ENCOUNTER — Other Ambulatory Visit: Payer: Medicare Other

## 2022-03-27 DIAGNOSIS — E1142 Type 2 diabetes mellitus with diabetic polyneuropathy: Secondary | ICD-10-CM

## 2022-03-27 DIAGNOSIS — Z794 Long term (current) use of insulin: Secondary | ICD-10-CM

## 2022-03-27 LAB — MICROALBUMIN / CREATININE URINE RATIO
Creatinine,U: 42.8 mg/dL
Microalb Creat Ratio: 1.6 mg/g (ref 0.0–30.0)
Microalb, Ur: <0.7 mg/dL (ref 0.0–1.9)

## 2022-03-29 DIAGNOSIS — M25611 Stiffness of right shoulder, not elsewhere classified: Secondary | ICD-10-CM | POA: Diagnosis not present

## 2022-03-29 DIAGNOSIS — G8929 Other chronic pain: Secondary | ICD-10-CM | POA: Diagnosis not present

## 2022-03-29 DIAGNOSIS — M50123 Cervical disc disorder at C6-C7 level with radiculopathy: Secondary | ICD-10-CM | POA: Diagnosis not present

## 2022-03-29 DIAGNOSIS — Z789 Other specified health status: Secondary | ICD-10-CM | POA: Diagnosis not present

## 2022-03-29 DIAGNOSIS — M25561 Pain in right knee: Secondary | ICD-10-CM | POA: Diagnosis not present

## 2022-03-29 DIAGNOSIS — M7541 Impingement syndrome of right shoulder: Secondary | ICD-10-CM | POA: Diagnosis not present

## 2022-03-29 DIAGNOSIS — Z7409 Other reduced mobility: Secondary | ICD-10-CM | POA: Diagnosis not present

## 2022-03-29 DIAGNOSIS — R29898 Other symptoms and signs involving the musculoskeletal system: Secondary | ICD-10-CM | POA: Diagnosis not present

## 2022-03-29 DIAGNOSIS — M7581 Other shoulder lesions, right shoulder: Secondary | ICD-10-CM | POA: Diagnosis not present

## 2022-04-05 ENCOUNTER — Telehealth: Payer: Self-pay | Admitting: *Deleted

## 2022-04-05 ENCOUNTER — Encounter: Payer: Self-pay | Admitting: *Deleted

## 2022-04-05 NOTE — Patient Instructions (Signed)
Visit Information  Thank you for taking time to visit with me today. Please don't hesitate to contact me if I can be of assistance to you.   Following are the goals we discussed today:   Goals Addressed               This Visit's Progress     COMPLETED: no needs (pt-stated)        Care Coordination Interventions: Reviewed medications with patient and discussed adherence to all medications with no needed refill Reviewed scheduled/upcoming provider appointments including pending appointment with sufficient transportation Screening for signs and symptoms of depression related to chronic disease state  Assessed social determinant of health barriers          Please call the care guide team at [phone] if you need to cancel or reschedule your appointment.   If you are experiencing a Mental Health or [place] or need someone to talk to, please call the Suicide and Crisis Lifeline: [phone]  Patient verbalizes understanding of instructions and care plan provided today and agrees to view in [facility]. Active MyChart status and patient understanding of how to access instructions and care plan via MyChart confirmed with patient.     No further follow up required: No needs  Raina Mina, RN Care Management Coordinator [place] Office [phone]

## 2022-04-05 NOTE — Patient Outreach (Signed)
  Care Coordination   Initial Visit Note   04/05/2022 Name: Blia Totman MRN: [id number] DOB: 1956/04/12  Navah Grondin is a 66 y.o. year old female who sees [name], MD for primary care. I spoke with  Lekita Mickelson by phone today.  What matters to the patients health and wellness today?  No needs    Goals Addressed               This Visit's Progress     COMPLETED: no needs (pt-stated)        Care Coordination Interventions: Reviewed medications with patient and discussed adherence to all medications with no needed refill Reviewed scheduled/upcoming provider appointments including pending appointment with sufficient transportation Screening for signs and symptoms of depression related to chronic disease state  Assessed social determinant of health barriers          SDOH assessments and interventions completed:  Yes  SDOH Interventions Today    Flowsheet Row Most Recent Value  SDOH Interventions   Food Insecurity Interventions Intervention Not Indicated  Housing Interventions Intervention Not Indicated  Transportation Interventions Intervention Not Indicated  Utilities Interventions Intervention Not Indicated        Care Coordination Interventions Activated:  Yes  Care Coordination Interventions:  Yes, provided   Follow up plan: No further intervention required.   Encounter Outcome:  Pt. Visit Completed   Raina Mina, RN Care Management Coordinator [place] Office [phone]

## 2022-04-19 NOTE — Progress Notes (Deleted)
Office Visit Note  Patient: Terri Gray             Date of Birth: [birth year]           MRN: [id number]             PCP: [name], MD Referring: [name], MD Visit Date: 05/02/2022 Occupation: _0 @  Subjective:  No chief complaint on file.   History of Present Illness: Terri Gray is a 66 y.o. female ***   Activities of Daily Living:  Patient reports morning stiffness for *** {minute/hour:19697}.   Patient {ACTIONS;DENIES/REPORTS:21021675::"Denies"} nocturnal pain.  Difficulty dressing/grooming: {ACTIONS;DENIES/REPORTS:21021675::"Denies"} Difficulty climbing stairs: {ACTIONS;DENIES/REPORTS:21021675::"Denies"} Difficulty getting out of chair: {ACTIONS;DENIES/REPORTS:21021675::"Denies"} Difficulty using hands for taps, buttons, cutlery, and/or writing: {ACTIONS;DENIES/REPORTS:21021675::"Denies"}  No Rheumatology ROS completed.   PMFS History:  Patient Active Problem List   Diagnosis Date Noted   Psoriasis 10/13/2021   GERD (gastroesophageal reflux disease) 02/09/2020   Urticaria 02/03/2020   Seasonal and perennial allergic rhinitis 02/03/2020   Allergic conjunctivitis 02/03/2020   Moderate persistent asthma 02/03/2020   Acquired hypothyroidism 12/17/2019   TMJ (temporomandibular joint disorder) 12/17/2019   Seasonal allergies 12/17/2019   Foot pain, left 07/16/2018   Palpitations 07/10/2018   Dyspnea on exertion 07/10/2018   Atypical chest pain 07/10/2018   Dizziness 07/10/2018   Localized osteoarthritis of right knee 02/28/2018   Radiculopathy of cervical spine 02/28/2018   Goiter 11/12/2017   Diabetes ([place]) 05/30/2017    Past Medical History:  Diagnosis Date   Allergy    Arthritis    Asthma    Chicken pox    Colon polyps    Diabetes mellitus without complication (HCC)    GERD (gastroesophageal reflux disease)    H/O fracture of nose    Broken by ENT   History of colon polyps    Hyperlipidemia    Hypertension    IBS  (irritable bowel syndrome)    Migraines    Obesity    Shingles 2012   Sleep apnea    on CPAP machine but uses it 90% of the time because it gives her some complication and does a mouth guard as well   Thyroid disease     Family History  Problem Relation Age of Onset   Bronchitis Mother    Ovarian cancer Mother 55   GER disease Mother    Colon polyps Mother    Irritable bowel syndrome Mother    Diabetes Father    Kidney disease Father    Heart failure Father    Irritable bowel syndrome Father    Diabetes Sister    Asthma Sister    Urticaria Sister    Diabetes Sister    Allergic rhinitis Daughter    GER disease Daughter    Irritable bowel syndrome Daughter    Irritable bowel syndrome Daughter    Diabetes Brother    Allergic rhinitis Son    Allergic rhinitis Grandchild    Food Allergy Grandchild    Immunodeficiency Neg Hx    Angioedema Neg Hx    Breast cancer Neg Hx    Past Surgical History:  Procedure Laterality Date   COLONOSCOPY     Around 2014- 2015 in [place]   ESOPHAGOGASTRODUODENOSCOPY     Around 2014-2015 [place]   REPLACEMENT TOTAL KNEE Left 2016   Social History   Social History Narrative   [place] [address]    Married   Son lives with them   1 dog   Enjoys  reading, tv, computer   Immunization History  Administered Date(s) Administered   Fluad Quad(high Dose 65+) 02/18/2019   Influenza, High Dose Seasonal PF 04/03/2021   Influenza,inj,Quad PF,6+ Mos 02/20/2018   Influenza-Unspecified 06/10/2020   PFIZER(Purple Top)SARS-COV-2 Vaccination 06/26/2019, 07/17/2019, 03/18/2020, 09/22/2020   Pneumococcal Conjugate-13 03/01/2018   Zoster Recombinat (Shingrix) 10/28/2019     Objective: Vital Signs: There were no vitals taken for this visit.   Physical Exam   Musculoskeletal Exam: ***  CDAI Exam: CDAI Score: -- Patient Global: --; Provider Global: -- Swollen: --; Tender: -- Joint Exam 05/02/2022   No joint exam has been documented for  this visit   There is currently no information documented on the homunculus. Go to the Rheumatology activity and complete the homunculus joint exam.  Investigation: No additional findings.  Imaging: No results found.  Recent Labs: Lab Results  Component Value Date   WBC 6.9 10/12/2021   HGB 12.2 10/12/2021   PLT 358.0 10/12/2021   NA 138 10/12/2021   K 4.6 10/12/2021   CL 101 10/12/2021   CO2 31 10/12/2021   GLUCOSE 78 10/12/2021   BUN 9 10/12/2021   CREATININE 0.88 10/12/2021   BILITOT 0.4 10/12/2021   ALKPHOS 92 10/12/2021   AST 19 10/12/2021   ALT 16 10/12/2021   PROT 7.5 10/12/2021   ALBUMIN 4.1 10/12/2021   CALCIUM 9.5 10/12/2021   GFRAA 93 02/05/2020    Speciality Comments: No specialty comments available.  Procedures:  No procedures performed Allergies: Gabapentin   Assessment / Plan:     Visit Diagnoses: Inverse psoriasis  Polyarthralgia - 10/12/21: ANA-, dsDNA 1, C4 61, parietal cell ab-, RNP-, Sm-,Smooth muscle ab-, mitochondrial-, Scl-70-, Ro-, La-, antistriation-, TPO-, ESR 36, CRP WNL,RF-,CCP-  Dry mouth  TMJ (temporomandibular joint disorder)  Radiculopathy of cervical spine  Shoulder stiffness, right  Localized osteoarthritis of right knee  Seasonal and perennial allergic rhinitis  History of asthma  Gastroesophageal reflux disease without esophagitis  Goiter  Acquired hypothyroidism  Type 2 diabetes mellitus with diabetic polyneuropathy, with long-term current use of insulin (HCC)  Urticaria  Orders: No orders of the defined types were placed in this encounter.  No orders of the defined types were placed in this encounter.   Face-to-face time spent with patient was *** minutes. Greater than 50% of time was spent in counseling and coordination of care.  Follow-Up Instructions: No follow-ups on file.   Ofilia Neas, PA-C  Note - This record has been created using Dragon software.  Chart creation errors have been sought,  but may not always  have been located. Such creation errors do not reflect on  the standard of medical care.,

## 2022-04-20 DIAGNOSIS — M1711 Unilateral primary osteoarthritis, right knee: Secondary | ICD-10-CM | POA: Diagnosis not present

## 2022-04-23 ENCOUNTER — Other Ambulatory Visit: Payer: Self-pay | Admitting: Family Medicine

## 2022-04-23 DIAGNOSIS — I10 Essential (primary) hypertension: Secondary | ICD-10-CM

## 2022-05-02 ENCOUNTER — Ambulatory Visit: Payer: Medicare Other | Admitting: Rheumatology

## 2022-05-16 DIAGNOSIS — G8929 Other chronic pain: Secondary | ICD-10-CM | POA: Diagnosis not present

## 2022-05-16 DIAGNOSIS — R29898 Other symptoms and signs involving the musculoskeletal system: Secondary | ICD-10-CM | POA: Diagnosis not present

## 2022-05-16 DIAGNOSIS — M25561 Pain in right knee: Secondary | ICD-10-CM | POA: Diagnosis not present

## 2022-05-16 DIAGNOSIS — Z7409 Other reduced mobility: Secondary | ICD-10-CM | POA: Diagnosis not present

## 2022-05-16 DIAGNOSIS — M50123 Cervical disc disorder at C6-C7 level with radiculopathy: Secondary | ICD-10-CM | POA: Diagnosis not present

## 2022-05-16 DIAGNOSIS — M7541 Impingement syndrome of right shoulder: Secondary | ICD-10-CM | POA: Diagnosis not present

## 2022-05-16 DIAGNOSIS — Z789 Other specified health status: Secondary | ICD-10-CM | POA: Diagnosis not present

## 2022-05-16 DIAGNOSIS — M25611 Stiffness of right shoulder, not elsewhere classified: Secondary | ICD-10-CM | POA: Diagnosis not present

## 2022-05-20 ENCOUNTER — Other Ambulatory Visit: Payer: Self-pay | Admitting: Family Medicine

## 2022-05-20 DIAGNOSIS — M26609 Unspecified temporomandibular joint disorder, unspecified side: Secondary | ICD-10-CM

## 2022-05-23 DIAGNOSIS — R29898 Other symptoms and signs involving the musculoskeletal system: Secondary | ICD-10-CM | POA: Diagnosis not present

## 2022-05-23 DIAGNOSIS — Z7409 Other reduced mobility: Secondary | ICD-10-CM | POA: Diagnosis not present

## 2022-05-23 DIAGNOSIS — G8929 Other chronic pain: Secondary | ICD-10-CM | POA: Diagnosis not present

## 2022-05-23 DIAGNOSIS — M25561 Pain in right knee: Secondary | ICD-10-CM | POA: Diagnosis not present

## 2022-05-23 DIAGNOSIS — M50123 Cervical disc disorder at C6-C7 level with radiculopathy: Secondary | ICD-10-CM | POA: Diagnosis not present

## 2022-05-23 DIAGNOSIS — M25611 Stiffness of right shoulder, not elsewhere classified: Secondary | ICD-10-CM | POA: Diagnosis not present

## 2022-05-23 DIAGNOSIS — Z789 Other specified health status: Secondary | ICD-10-CM | POA: Diagnosis not present

## 2022-05-23 DIAGNOSIS — M7541 Impingement syndrome of right shoulder: Secondary | ICD-10-CM | POA: Diagnosis not present

## 2022-05-25 ENCOUNTER — Ambulatory Visit: Payer: Medicare Other | Admitting: Rheumatology

## 2022-06-21 ENCOUNTER — Other Ambulatory Visit: Payer: Self-pay | Admitting: Family Medicine

## 2022-06-21 DIAGNOSIS — K219 Gastro-esophageal reflux disease without esophagitis: Secondary | ICD-10-CM

## 2022-06-29 DIAGNOSIS — M2391 Unspecified internal derangement of right knee: Secondary | ICD-10-CM | POA: Diagnosis not present

## 2022-06-29 DIAGNOSIS — M7581 Other shoulder lesions, right shoulder: Secondary | ICD-10-CM | POA: Diagnosis not present

## 2022-06-29 DIAGNOSIS — M5412 Radiculopathy, cervical region: Secondary | ICD-10-CM | POA: Diagnosis not present

## 2022-06-30 DIAGNOSIS — E1140 Type 2 diabetes mellitus with diabetic neuropathy, unspecified: Secondary | ICD-10-CM | POA: Diagnosis not present

## 2022-06-30 DIAGNOSIS — Z794 Long term (current) use of insulin: Secondary | ICD-10-CM | POA: Diagnosis not present

## 2022-06-30 DIAGNOSIS — Z833 Family history of diabetes mellitus: Secondary | ICD-10-CM | POA: Diagnosis not present

## 2022-06-30 DIAGNOSIS — I10 Essential (primary) hypertension: Secondary | ICD-10-CM | POA: Diagnosis not present

## 2022-06-30 DIAGNOSIS — Z7984 Long term (current) use of oral hypoglycemic drugs: Secondary | ICD-10-CM | POA: Diagnosis not present

## 2022-06-30 DIAGNOSIS — E039 Hypothyroidism, unspecified: Secondary | ICD-10-CM | POA: Diagnosis not present

## 2022-06-30 DIAGNOSIS — E785 Hyperlipidemia, unspecified: Secondary | ICD-10-CM | POA: Diagnosis not present

## 2022-07-04 DIAGNOSIS — R21 Rash and other nonspecific skin eruption: Secondary | ICD-10-CM | POA: Diagnosis not present

## 2022-07-04 DIAGNOSIS — E1142 Type 2 diabetes mellitus with diabetic polyneuropathy: Secondary | ICD-10-CM | POA: Diagnosis not present

## 2022-07-04 DIAGNOSIS — J3089 Other allergic rhinitis: Secondary | ICD-10-CM | POA: Diagnosis not present

## 2022-07-04 DIAGNOSIS — J3489 Other specified disorders of nose and nasal sinuses: Secondary | ICD-10-CM | POA: Diagnosis not present

## 2022-07-04 DIAGNOSIS — E039 Hypothyroidism, unspecified: Secondary | ICD-10-CM | POA: Diagnosis not present

## 2022-07-04 DIAGNOSIS — R0981 Nasal congestion: Secondary | ICD-10-CM | POA: Diagnosis not present

## 2022-07-04 DIAGNOSIS — Z794 Long term (current) use of insulin: Secondary | ICD-10-CM | POA: Diagnosis not present

## 2022-07-11 DIAGNOSIS — S83281A Other tear of lateral meniscus, current injury, right knee, initial encounter: Secondary | ICD-10-CM | POA: Diagnosis not present

## 2022-07-11 DIAGNOSIS — M1711 Unilateral primary osteoarthritis, right knee: Secondary | ICD-10-CM | POA: Diagnosis not present

## 2022-07-11 DIAGNOSIS — M7121 Synovial cyst of popliteal space [Baker], right knee: Secondary | ICD-10-CM | POA: Diagnosis not present

## 2022-07-11 DIAGNOSIS — M25561 Pain in right knee: Secondary | ICD-10-CM | POA: Diagnosis not present

## 2022-07-18 ENCOUNTER — Other Ambulatory Visit: Payer: Self-pay | Admitting: Family Medicine

## 2022-07-18 DIAGNOSIS — M26609 Unspecified temporomandibular joint disorder, unspecified side: Secondary | ICD-10-CM

## 2022-07-19 DIAGNOSIS — M7121 Synovial cyst of popliteal space [Baker], right knee: Secondary | ICD-10-CM | POA: Diagnosis not present

## 2022-07-19 DIAGNOSIS — M1711 Unilateral primary osteoarthritis, right knee: Secondary | ICD-10-CM | POA: Diagnosis not present

## 2022-08-14 DIAGNOSIS — M1711 Unilateral primary osteoarthritis, right knee: Secondary | ICD-10-CM | POA: Diagnosis not present

## 2022-08-16 ENCOUNTER — Telehealth: Payer: Self-pay | Admitting: Family Medicine

## 2022-08-16 NOTE — Telephone Encounter (Signed)
Contacted Terri Gray to schedule their annual wellness visit. Appointment made for 08/21/22.  Terri Gray AWV direct phone # [phone]   Terri Gray out of office PAL  change time and put on [place] schedule for Terri Gray to call patient

## 2022-08-21 ENCOUNTER — Ambulatory Visit (INDEPENDENT_AMBULATORY_CARE_PROVIDER_SITE_OTHER): Payer: Medicare Other

## 2022-08-21 ENCOUNTER — Telehealth: Payer: Medicare Other | Admitting: Family Medicine

## 2022-08-21 VITALS — Ht 66.0 in | Wt 196.0 lb

## 2022-08-21 DIAGNOSIS — Z0000 Encounter for general adult medical examination without abnormal findings: Secondary | ICD-10-CM

## 2022-08-21 NOTE — Patient Instructions (Signed)
Terri Gray , Thank you for taking time to come for your Medicare Wellness Visit. I appreciate your ongoing commitment to your health goals. Please review the following plan we discussed and let me know if I can assist you in the future.   These are the goals we discussed:  Goals       improve pain in foot and ankle (pt-stated)      Improve knee pain      Patient Stated      08/21/2022, get knee taken care of      remain covid free      Track and Manage My Blood Pressure-Hypertension      Timeframe:  Short-Term Goal Priority:  Medium Start Date:                             Expected End Date:                       Follow Up Date 04/13/21   - check blood pressure weekly - choose a place to take my blood pressure (home, clinic or office, retail store) - write blood pressure results in a log or diary    Why is this important?   You won't feel high blood pressure, but it can still hurt your blood vessels.  High blood pressure can cause heart or kidney problems. It can also cause a stroke.  Making lifestyle changes like losing a little weight or eating less salt will help.  Checking your blood pressure at home and at different times of the day can help to control blood pressure.  If the doctor prescribes medicine remember to take it the way the doctor ordered.  Call the office if you cannot afford the medicine or if there are questions about it.     Notes:         This is a list of the screening recommended for you and due dates:  Health Maintenance  Topic Date Due   DTaP/Tdap/Td vaccine (1 - Tdap) Never done   Complete foot exam   07/17/2019   Zoster (Shingles) Vaccine (2 of 2) 12/23/2019   Pneumonia Vaccine (2 of 2 - PPSV23 or PCV20) 03/03/2020   Eye exam for diabetics  03/25/2021   Hemoglobin A1C  09/29/2021   COVID-19 Vaccine (5 - 2023-24 season) 01/20/2022   Yearly kidney function blood test for diabetes  10/13/2022   Flu Shot  12/21/2022   Yearly kidney health  urinalysis for diabetes  03/28/2023   Medicare Annual Wellness Visit  08/21/2023   Mammogram  10/26/2023   Colon Cancer Screening  03/10/2030   DEXA scan (bone density measurement)  Completed   Hepatitis C Screening: USPSTF Recommendation to screen - Ages 63-79 yo.  Completed   HPV Vaccine  Aged Out    Advanced directives: Advance directive discussed with you today.   Conditions/risks identified: none  Next appointment: Follow up in one year for your annual wellness visit    Preventive Care 65 Years and Older, Female Preventive care refers to lifestyle choices and visits with your health care provider that can promote health and wellness. What does preventive care include? A yearly physical exam. This is also called an annual well check. Dental exams once or twice a year. Routine eye exams. Ask your health care provider how often you should have your eyes checked. Personal lifestyle choices, including: Daily care of your teeth  and gums. Regular physical activity. Eating a healthy diet. Avoiding tobacco and drug use. Limiting alcohol use. Practicing safe sex. Taking low-dose aspirin every day. Taking vitamin and mineral supplements as recommended by your health care provider. What happens during an annual well check? The services and screenings done by your health care provider during your annual well check will depend on your age, overall health, lifestyle risk factors, and family history of disease. Counseling  Your health care provider may ask you questions about your: Alcohol use. Tobacco use. Drug use. Emotional well-being. Home and relationship well-being. Sexual activity. Eating habits. History of falls. Memory and ability to understand (cognition). Work and work Statistician. Reproductive health. Screening  You may have the following tests or measurements: Height, weight, and BMI. Blood pressure. Lipid and cholesterol levels. These may be checked every 5 years,  or more frequently if you are over 65 years old. Skin check. Lung cancer screening. You may have this screening every year starting at age 39 if you have a 30-pack-year history of smoking and currently smoke or have quit within the past 15 years. Fecal occult blood test (FOBT) of the stool. You may have this test every year starting at age 56. Flexible sigmoidoscopy or colonoscopy. You may have a sigmoidoscopy every 5 years or a colonoscopy every 10 years starting at age 60. Hepatitis C blood test. Hepatitis B blood test. Sexually transmitted disease (STD) testing. Diabetes screening. This is done by checking your blood sugar (glucose) after you have not eaten for a while (fasting). You may have this done every 1-3 years. Bone density scan. This is done to screen for osteoporosis. You may have this done starting at age 73. Mammogram. This may be done every 1-2 years. Talk to your health care provider about how often you should have regular mammograms. Talk with your health care provider about your test results, treatment options, and if necessary, the need for more tests. Vaccines  Your health care provider may recommend certain vaccines, such as: Influenza vaccine. This is recommended every year. Tetanus, diphtheria, and acellular pertussis (Tdap, Td) vaccine. You may need a Td booster every 10 years. Zoster vaccine. You may need this after age 26. Pneumococcal 13-valent conjugate (PCV13) vaccine. One dose is recommended after age 51. Pneumococcal polysaccharide (PPSV23) vaccine. One dose is recommended after age 75. Talk to your health care provider about which screenings and vaccines you need and how often you need them. This information is not intended to replace advice given to you by your health care provider. Make sure you discuss any questions you have with your health care provider. Document Released: 06/04/2015 Document Revised: 01/26/2016 Document Reviewed: 03/09/2015 Elsevier  Interactive Patient Education  2017 [place] Prevention in the Home Falls can cause injuries. They can happen to people of all ages. There are many things you can do to make your home safe and to help prevent falls. What can I do on the outside of my home? Regularly fix the edges of walkways and driveways and fix any cracks. Remove anything that might make you trip as you walk through a door, such as a raised step or threshold. Trim any bushes or trees on the path to your home. Use bright outdoor lighting. Clear any walking paths of anything that might make someone trip, such as rocks or tools. Regularly check to see if handrails are loose or broken. Make sure that both sides of any steps have handrails. Any raised decks and porches  should have guardrails on the edges. Have any leaves, snow, or ice cleared regularly. Use sand or salt on walking paths during winter. Clean up any spills in your garage right away. This includes oil or grease spills. What can I do in the bathroom? Use night lights. Install grab bars by the toilet and in the tub and shower. Do not use towel bars as grab bars. Use non-skid mats or decals in the tub or shower. If you need to sit down in the shower, use a plastic, non-slip stool. Keep the floor dry. Clean up any water that spills on the floor as soon as it happens. Remove soap buildup in the tub or shower regularly. Attach bath mats securely with double-sided non-slip rug tape. Do not have throw rugs and other things on the floor that can make you trip. What can I do in the bedroom? Use night lights. Make sure that you have a light by your bed that is easy to reach. Do not use any sheets or blankets that are too big for your bed. They should not hang down onto the floor. Have a firm chair that has side arms. You can use this for support while you get dressed. Do not have throw rugs and other things on the floor that can make you trip. What can I do  in the kitchen? Clean up any spills right away. Avoid walking on wet floors. Keep items that you use a lot in easy-to-reach places. If you need to reach something above you, use a strong step stool that has a grab bar. Keep electrical cords out of the way. Do not use floor polish or wax that makes floors slippery. If you must use wax, use non-skid floor wax. Do not have throw rugs and other things on the floor that can make you trip. What can I do with my stairs? Do not leave any items on the stairs. Make sure that there are handrails on both sides of the stairs and use them. Fix handrails that are broken or loose. Make sure that handrails are as long as the stairways. Check any carpeting to make sure that it is firmly attached to the stairs. Fix any carpet that is loose or worn. Avoid having throw rugs at the top or bottom of the stairs. If you do have throw rugs, attach them to the floor with carpet tape. Make sure that you have a light switch at the top of the stairs and the bottom of the stairs. If you do not have them, ask someone to add them for you. What else can I do to help prevent falls? Wear shoes that: Do not have high heels. Have rubber bottoms. Are comfortable and fit you well. Are closed at the toe. Do not wear sandals. If you use a stepladder: Make sure that it is fully opened. Do not climb a closed stepladder. Make sure that both sides of the stepladder are locked into place. Ask someone to hold it for you, if possible. Clearly mark and make sure that you can see: Any grab bars or handrails. First and last steps. Where the edge of each step is. Use tools that help you move around (mobility aids) if they are needed. These include: Canes. Walkers. Scooters. Crutches. Turn on the lights when you go into a dark area. Replace any light bulbs as soon as they burn out. Set up your furniture so you have a clear path. Avoid moving your furniture around. If any of  your  floors are uneven, fix them. If there are any pets around you, be aware of where they are. Review your medicines with your doctor. Some medicines can make you feel dizzy. This can increase your chance of falling. Ask your doctor what other things that you can do to help prevent falls. This information is not intended to replace advice given to you by your health care provider. Make sure you discuss any questions you have with your health care provider. Document Released: 03/04/2009 Document Revised: 10/14/2015 Document Reviewed: 06/12/2014 Elsevier Interactive Patient Education  2017 [employer].

## 2022-08-21 NOTE — Progress Notes (Signed)
I connected with  Jariana Copes on 08/21/22 by a audio enabled telemedicine application and verified that I am speaking with the correct person using two identifiers.  Patient Location: Home  Provider Location: Office/Clinic  I discussed the limitations of evaluation and management by telemedicine. The patient expressed understanding and agreed to proceed.  Subjective:   Terri Gray is a 67 y.o. female who presents for Medicare Annual (Subsequent) preventive examination.  Review of Systems     Cardiac Risk Factors include: advanced age (>22men, >80 women);diabetes mellitus;obesity (BMI >30kg/m2)     Objective:    Today's Vitals   08/21/22 1526 08/21/22 1527  Weight: 196 lb (88.9 kg)   Height: 5\' 6"  (1.676 m)   PainSc:  10-Worst pain ever   Body mass index is 31.64 kg/m.     08/21/2022    3:36 PM 08/17/2021    9:29 AM 07/27/2020    8:41 AM 07/10/2019    9:56 AM  Advanced Directives  Does Patient Have a Medical Advance Directive? No Yes Yes Yes  Type of Corporate treasurer of [place];Living will Advance;Living will [place];Living will  Does patient want to make changes to medical advance directive?  No - Patient declined No - Patient declined No - Patient declined  Copy of [place] in Chart?  No - copy requested No - copy requested No - copy requested    Current Medications (verified) Outpatient Encounter Medications as of 08/21/2022  Medication Sig   albuterol (VENTOLIN HFA) 108 (90 Base) MCG/ACT inhaler INHALE TWO PUFFS BY MOUTH EVERY 6 HOURS AS NEEDED FOR SHORTNESS OF BREATH OR WHEEZING   budesonide-formoterol (SYMBICORT) 160-4.5 MCG/ACT inhaler Inhale 2 puffs into the lungs in the morning and at bedtime.   cyclobenzaprine (FLEXERIL) 10 MG tablet TAKE ONE TABLET BY MOUTH THREE TIMES A DAY AS NEEDED FOR MUSCLE SPASMS   esomeprazole (NEXIUM) 40 MG capsule TAKE 1 CAPSULE BY MOUTH TWICE A DAY  FOR 4 WEEKS THEN TAKE 1 CAPSULE BY MOUTH DAILY   ferrous sulfate 325 (65 FE) MG tablet Take one tablet by mouth every other day, with a full glass of water.   fluticasone (FLONASE) 50 MCG/ACT nasal spray Place 1-2 sprays into both nostrils daily as needed.   folic acid (FOLVITE) 1 MG tablet Take 1 mg by mouth daily.   glucose blood (TRUE METRIX BLOOD GLUCOSE TEST) test strip 1 each by Other route 4 (four) times daily as needed for other. And lancets 2/day   hydrocortisone valerate ointment (WESTCORT) 0.2 % Apply 1 application topically 2 (two) times daily.   ibuprofen (ADVIL) 200 MG tablet Take 200 mg by mouth every 8 (eight) hours as needed.   irbesartan (AVAPRO) 75 MG tablet TAKE 1 TABLET BY MOUTH DAILY   LANTUS SOLOSTAR 100 UNIT/ML Solostar Pen INJECT 85 UNITS SUBCUTANEOUSLY ONCE DAILY IN THE MORNING   levothyroxine (SYNTHROID) 75 MCG tablet Take 1 tablet (75 mcg total) by mouth daily before breakfast.   meloxicam (MOBIC) 15 MG tablet Take 15 mg by mouth daily.   metFORMIN (GLUCOPHAGE) 1000 MG tablet Take 1 tablet (1,000 mg total) by mouth 2 (two) times daily with a meal.   methotrexate (RHEUMATREX) 2.5 MG tablet Take by mouth.   mometasone (ELOCON) 0.1 % cream Apply topically.   montelukast (SINGULAIR) 10 MG tablet Take 1 tablet (10 mg total) by mouth at bedtime.   Olopatadine HCl (PATADAY) 0.2 % SOLN Place 1 drop into  both eyes daily as needed.   pimecrolimus (ELIDEL) 1 % cream Apply twice a day on affected areas   simvastatin (ZOCOR) 40 MG tablet Take 1 tablet (40 mg total) by mouth daily.   folic acid (FOLVITE) 1 MG tablet Take 1 tablet by mouth daily.   Semaglutide,0.25 or 0.5MG /DOS, 2 MG/1.5ML SOPN Inject into the skin. (Patient not taking: Reported on 08/21/2022)   No facility-administered encounter medications on file as of 08/21/2022.    Allergies (verified) Gabapentin   History: Past Medical History:  Diagnosis Date   Allergy    Arthritis    Asthma    Chicken pox    Colon  polyps    Diabetes mellitus without complication    GERD (gastroesophageal reflux disease)    H/O fracture of nose    Broken by ENT   History of colon polyps    Hyperlipidemia    Hypertension    IBS (irritable bowel syndrome)    Migraines    Obesity    Shingles 2012   Sleep apnea    on CPAP machine but uses it 90% of the time because it gives her some complication and does a mouth guard as well   Thyroid disease    Past Surgical History:  Procedure Laterality Date   COLONOSCOPY     Around 2014- 2015 in [place]   ESOPHAGOGASTRODUODENOSCOPY     Around 2014-2015 [place]   REPLACEMENT TOTAL KNEE Left 2016   Family History  Problem Relation Age of Onset   Bronchitis Mother    Ovarian cancer Mother 13   GER disease Mother    Colon polyps Mother    Irritable bowel syndrome Mother    Diabetes Father    Kidney disease Father    Heart failure Father    Irritable bowel syndrome Father    Diabetes Sister    Asthma Sister    Urticaria Sister    Diabetes Sister    Allergic rhinitis Daughter    GER disease Daughter    Irritable bowel syndrome Daughter    Irritable bowel syndrome Daughter    Diabetes Brother    Allergic rhinitis Son    Allergic rhinitis Grandchild    Food Allergy Grandchild    Immunodeficiency Neg Hx    Angioedema Neg Hx    Breast cancer Neg Hx    Social History   Socioeconomic History   Marital status: Married    Spouse name: Not on file   Number of children: 3   Years of education: 14   Highest education level: Associate degree: occupational, Hotel manager, or vocational program  Occupational History   Not on file  Tobacco Use   Smoking status: Never   Smokeless tobacco: Never  Vaping Use   Vaping Use: Never used  Substance and Sexual Activity   Alcohol use: No    Comment: last drink was over 30 years ago   Drug use: No   Sexual activity: Not on file  Other Topics Concern   Not on file  Social History Narrative   HH 3     Married   Son lives with them   1 dog   Enjoys reading, tv, Teaching laboratory technician   Social Determinants of Health   Financial Resource Strain: Low Risk  (08/21/2022)   Overall Financial Resource Strain (CARDIA)    Difficulty of Paying Living Expenses: Not hard at all  Food Insecurity: No Food Insecurity (08/21/2022)   Hunger Vital Sign    Worried About  Running Out of Food in the Last Year: Never true    [place] in the Last Year: Never true  Transportation Needs: No Transportation Needs (08/21/2022)   PRAPARE - Hydrologist (Medical): No    Lack of Transportation (Non-Medical): No  Physical Activity: Inactive (08/21/2022)   Exercise Vital Sign    Days of Exercise per Week: 0 days    Minutes of Exercise per Session: 0 min  Stress: No Stress Concern Present (08/21/2022)   [place]    Feeling of Stress : Only a little  Social Connections: Moderately Isolated (08/17/2021)   Social Connection and Isolation Panel [NHANES]    Frequency of Communication with Friends and Family: More than three times a week    Frequency of Social Gatherings with Friends and Family: Twice a week    Attends Religious Services: Never    Marine scientist or Organizations: No    Attends Music therapist: Never    Marital Status: Married    Tobacco Counseling Counseling given: Not Answered   Clinical Intake:  Pre-visit preparation completed: Yes  Pain : 0-10 Pain Score: 10-Worst pain ever Pain Type: Chronic pain Pain Location: Knee Pain Orientation: Right Pain Descriptors / Indicators: Aching Pain Onset: More than a month ago Pain Frequency: Constant     Nutritional Status: BMI > 30  Obese Nutritional Risks: None Diabetes: Yes  How often do you need to have someone help you when you read instructions, pamphlets, or other written materials from your doctor or pharmacy?: 1 - Never  Diabetic?  Yes Nutrition Risk Assessment:  Has the patient had any N/V/D within the last 2 months?  No  Does the patient have any non-healing wounds?  No  Has the patient had any unintentional weight loss or weight gain?  No   Diabetes:  Is the patient diabetic?  Yes  If diabetic, was a CBG obtained today?  No  Did the patient bring in their glucometer from home?  No  How often do you monitor your CBG's? daily.   Financial Strains and Diabetes Management:  Are you having any financial strains with the device, your supplies or your medication? Yes .  Does the patient want to be seen by Chronic Care Management for management of their diabetes?  No  Would the patient like to be referred to a Nutritionist or for Diabetic Management?  No   Diabetic Exams:  Diabetic Eye Exam: Overdue for diabetic eye exam. Pt has been advised about the importance in completing this exam. Patient advised to call and schedule an eye exam. Diabetic Foot Exam: Overdue, Pt has been advised about the importance in completing this exam. Pt is scheduled for diabetic foot exam on next appointment.      Information entered by :: NAllen LPN   Activities of Daily Living    08/21/2022    3:38 PM  In your present state of health, do you have any difficulty performing the following activities:  Hearing? 0  Comment has ringing in ear  Vision? 0  Difficulty concentrating or making decisions? 0  Walking or climbing stairs? 1  Dressing or bathing? 0  Doing errands, shopping? 0  Preparing Food and eating ? N  Using the Toilet? N  In the past six months, have you accidently leaked urine? N  Do you have problems with loss of bowel control? N  Managing your  Medications? N  Managing your Finances? N  Housekeeping or managing your Housekeeping? N    Patient Care Team: [name], MD as PCP - General (Family Medicine) [name], [facility] (Inactive) as Pharmacist (Pharmacist)  Indicate any recent Medical Services  you may have received from other than [facility] providers in the past year (date may be approximate).     Assessment:   This is a routine wellness examination for Terri Gray.  Hearing/Vision screen Vision Screening - Comments:: Regular eye exams, [place]  Dietary issues and exercise activities discussed: Current Exercise Habits: The patient does not participate in regular exercise at present   Goals Addressed             This Visit's Progress    Patient Stated       08/21/2022, get knee taken care of       Depression Screen    08/21/2022    3:38 PM 04/05/2022    1:47 PM 02/20/2022   10:11 AM 12/26/2021   10:25 AM 10/12/2021    8:22 AM 08/17/2021    9:24 AM 06/08/2021   11:33 AM  PHQ 2/9 Scores  PHQ - 2 Score 0 0 0 0 0 0 0  PHQ- 9 Score    1 1  1     Fall Risk    08/21/2022    3:38 PM 02/20/2022   10:11 AM 12/26/2021   10:25 AM 10/12/2021    8:21 AM 08/17/2021    9:28 AM  Fall Risk   Falls in the past year? 0 0 0 0 0  Number falls in past yr: 0 0 0 0 0  Injury with Fall? 0 0 0 0 0  Risk for fall due to : Medication side effect;Impaired mobility;Impaired balance/gait No Fall Risks No Fall Risks No Fall Risks No Fall Risks  Follow up Falls prevention discussed;Education provided;Falls evaluation completed Falls evaluation completed Falls evaluation completed Falls evaluation completed     FALL RISK PREVENTION PERTAINING TO THE HOME:  Any stairs in or around the home? Yes  If so, are there any without handrails? No  Home free of loose throw rugs in walkways, pet beds, electrical cords, etc? Yes  Adequate lighting in your home to reduce risk of falls? Yes   ASSISTIVE DEVICES UTILIZED TO PREVENT FALLS:  Life alert? No  Use of a cane, walker or w/c? Yes  Grab bars in the bathroom? Yes  Shower chair or bench in shower? No  Elevated toilet seat or a handicapped toilet? Yes   TIMED UP AND GO:  Was the test performed? No .      Cognitive Function:        08/21/2022     3:41 PM 08/17/2021    9:29 AM 07/10/2019    9:59 AM  6CIT Screen  What Year? 0 points 0 points 0 points  What month? 0 points 0 points 0 points  What time? 0 points 0 points 0 points  Count back from 20 0 points 0 points 0 points  Months in reverse 0 points 0 points 0 points  Repeat phrase 2 points 0 points 0 points  Total Score 2 points 0 points 0 points    Immunizations Immunization History  Administered Date(s) Administered   Fluad Quad(high Dose 65+) 02/18/2019   Influenza, High Dose Seasonal PF 04/03/2021   Influenza,inj,Quad PF,6+ Mos 02/20/2018   Influenza-Unspecified 06/10/2020   PFIZER(Purple Top)SARS-COV-2 Vaccination 06/26/2019, 07/17/2019, 03/18/2020, 09/22/2020   Pneumococcal Conjugate-13  03/01/2018   Zoster Recombinat (Shingrix) 10/28/2019    TDAP status: Due, Education has been provided regarding the importance of this vaccine. Advised may receive this vaccine at local pharmacy or Health Dept. Aware to provide a copy of the vaccination record if obtained from local pharmacy or Health Dept. Verbalized acceptance and understanding.  Flu Vaccine status: Up to date  Pneumococcal vaccine status: Up to date  Covid-19 vaccine status: Completed vaccines  Qualifies for Shingles Vaccine? Yes   Zostavax completed No   Shingrix Completed?: needs second dose  Screening Tests Health Maintenance  Topic Date Due   DTaP/Tdap/Td (1 - Tdap) Never done   FOOT EXAM  07/17/2019   Zoster Vaccines- Shingrix (2 of 2) 12/23/2019   Pneumonia Vaccine 78+ Years old (2 of 2 - PPSV23 or PCV20) 03/03/2020   OPHTHALMOLOGY EXAM  03/25/2021   HEMOGLOBIN A1C  09/29/2021   COVID-19 Vaccine (5 - 2023-24 season) 01/20/2022   Medicare Annual Wellness (AWV)  08/18/2022   Diabetic kidney evaluation - eGFR measurement  10/13/2022   INFLUENZA VACCINE  12/21/2022   Diabetic kidney evaluation - Urine ACR  03/28/2023   MAMMOGRAM  10/26/2023   COLONOSCOPY (Pts 45-69yrs Insurance coverage will  need to be confirmed)  03/10/2030   DEXA SCAN  Completed   Hepatitis C Screening  Completed   HPV VACCINES  Aged Out    Health Maintenance  Health Maintenance Due  Topic Date Due   DTaP/Tdap/Td (1 - Tdap) Never done   FOOT EXAM  07/17/2019   Zoster Vaccines- Shingrix (2 of 2) 12/23/2019   Pneumonia Vaccine 26+ Years old (2 of 2 - PPSV23 or PCV20) 03/03/2020   OPHTHALMOLOGY EXAM  03/25/2021   HEMOGLOBIN A1C  09/29/2021   COVID-19 Vaccine (5 - 2023-24 season) 01/20/2022   Medicare Annual Wellness (AWV)  08/18/2022    Colorectal cancer screening: Type of screening: Colonoscopy. Completed 03/10/2020. Repeat every 10 years  Mammogram status: Completed 10/25/2021. Repeat every year  Bone Density status: Completed 01/14/2021  Lung Cancer Screening: (Low Dose CT Chest recommended if Age 78-80 years, 30 pack-year currently smoking OR have quit w/in 15years.) does not qualify.   Lung Cancer Screening Referral: no  Additional Screening:  Hepatitis C Screening: does qualify; Completed 12/03/2019  Vision Screening: Recommended annual ophthalmology exams for early detection of glaucoma and other disorders of the eye. Is the patient up to date with their annual eye exam?  Yes  Who is the provider or what is the name of the office in which the patient attends annual eye exams? [facility] If pt is not established with a provider, would they like to be referred to a provider to establish care? No .   Dental Screening: Recommended annual dental exams for proper oral hygiene  Community Resource Referral / Chronic Care Management: CRR required this visit?  No   CCM required this visit?  No      Plan:     I have personally reviewed and noted the following in the patient's chart:   Medical and social history Use of alcohol, tobacco or illicit drugs  Current medications and supplements including opioid prescriptions. Patient is not currently taking opioid prescriptions. Functional  ability and status Nutritional status Physical activity Advanced directives List of other physicians Hospitalizations, surgeries, and ER visits in previous 12 months Vitals Screenings to include cognitive, depression, and falls Referrals and appointments  In addition, I have reviewed and discussed with patient certain preventive protocols, quality metrics, and  best practice recommendations. A written personalized care plan for preventive services as well as general preventive health recommendations were provided to patient.     Kellie Simmering, LPN   [id number]   Nurse Notes: none  Due to this being a virtual visit, the after visit summary with patients personalized plan was offered to patient via mail or my-chart. Patient would like to access on my-chart

## 2022-08-23 ENCOUNTER — Encounter: Payer: Self-pay | Admitting: Family Medicine

## 2022-08-23 ENCOUNTER — Ambulatory Visit (INDEPENDENT_AMBULATORY_CARE_PROVIDER_SITE_OTHER): Payer: Medicare Other | Admitting: Family Medicine

## 2022-08-23 ENCOUNTER — Ambulatory Visit (INDEPENDENT_AMBULATORY_CARE_PROVIDER_SITE_OTHER): Payer: Medicare Other

## 2022-08-23 VITALS — BP 155/90 | HR 79 | Temp 99.1°F | Resp 16 | Ht 66.0 in | Wt 197.0 lb

## 2022-08-23 DIAGNOSIS — M1711 Unilateral primary osteoarthritis, right knee: Secondary | ICD-10-CM | POA: Diagnosis not present

## 2022-08-23 DIAGNOSIS — R062 Wheezing: Secondary | ICD-10-CM

## 2022-08-23 DIAGNOSIS — M23200 Derangement of unspecified lateral meniscus due to old tear or injury, right knee: Secondary | ICD-10-CM | POA: Diagnosis not present

## 2022-08-23 DIAGNOSIS — Z01818 Encounter for other preprocedural examination: Secondary | ICD-10-CM

## 2022-08-23 DIAGNOSIS — R682 Dry mouth, unspecified: Secondary | ICD-10-CM

## 2022-08-23 DIAGNOSIS — R0602 Shortness of breath: Secondary | ICD-10-CM | POA: Diagnosis not present

## 2022-08-23 DIAGNOSIS — L408 Other psoriasis: Secondary | ICD-10-CM

## 2022-08-23 DIAGNOSIS — M2550 Pain in unspecified joint: Secondary | ICD-10-CM

## 2022-08-23 DIAGNOSIS — G8929 Other chronic pain: Secondary | ICD-10-CM

## 2022-08-23 DIAGNOSIS — M25561 Pain in right knee: Secondary | ICD-10-CM | POA: Diagnosis not present

## 2022-08-23 MED ORDER — TRAMADOL HCL 50 MG PO TABS
50.0000 mg | ORAL_TABLET | Freq: Three times a day (TID) | ORAL | 0 refills | Status: AC | PRN
Start: 2022-08-23 — End: 2022-08-28
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
## 2022-08-24 ENCOUNTER — Telehealth: Payer: Self-pay | Admitting: Family Medicine

## 2022-08-24 NOTE — Telephone Encounter (Signed)
Patient c/o elevated blood pressure, last seen 08/23/22

## 2022-08-25 NOTE — Telephone Encounter (Addendum)
Spoke to pt and she stated that her BP is elevated. Pt also c/o cough and congestion and taking OTC medication for this. Advised pt that some OTC medication can elevate BP. Pt claimed that she stopped taking the OTC medication yesterday. Pt articulated that her head is hurting really bad low grade fever of 99. Pt took temp today temp was at 98.1. pt is worried about the elevated numbers and stated that she needs something for the cough. Pt also informed X-ray results are not in yet.

## 2022-08-26 ENCOUNTER — Other Ambulatory Visit: Payer: Self-pay | Admitting: Family Medicine

## 2022-08-26 DIAGNOSIS — R0602 Shortness of breath: Secondary | ICD-10-CM

## 2022-08-26 DIAGNOSIS — R9389 Abnormal findings on diagnostic imaging of other specified body structures: Secondary | ICD-10-CM

## 2022-08-26 MED ORDER — AZITHROMYCIN 250 MG PO TABS
ORAL_TABLET | ORAL | 0 refills | Status: AC
Start: 2022-08-26 — End: 2022-08-31

## 2022-08-26 MED ORDER — AMOXICILLIN-POT CLAVULANATE 875-125 MG PO TABS
1.0000 | ORAL_TABLET | Freq: Two times a day (BID) | ORAL | 0 refills | Status: DC
Start: 2022-08-26 — End: 2023-05-15

## 2022-08-28 NOTE — Telephone Encounter (Signed)
Pt contacted 4/5 regarding results and rx for abx sent to pharmacy for CXR results.

## 2022-09-01 DIAGNOSIS — L280 Lichen simplex chronicus: Secondary | ICD-10-CM | POA: Diagnosis not present

## 2022-09-01 DIAGNOSIS — L432 Lichenoid drug reaction: Secondary | ICD-10-CM | POA: Diagnosis not present

## 2022-09-04 ENCOUNTER — Encounter: Payer: Self-pay | Admitting: Family Medicine

## 2022-09-04 ENCOUNTER — Ambulatory Visit (INDEPENDENT_AMBULATORY_CARE_PROVIDER_SITE_OTHER): Payer: Medicare Other | Admitting: Family Medicine

## 2022-09-04 VITALS — BP 148/96 | HR 94 | Temp 98.4°F | Wt 196.4 lb

## 2022-09-04 DIAGNOSIS — R9389 Abnormal findings on diagnostic imaging of other specified body structures: Secondary | ICD-10-CM | POA: Diagnosis not present

## 2022-09-04 DIAGNOSIS — I10 Essential (primary) hypertension: Secondary | ICD-10-CM | POA: Diagnosis not present

## 2022-09-04 DIAGNOSIS — R062 Wheezing: Secondary | ICD-10-CM

## 2022-09-04 DIAGNOSIS — Z8669 Personal history of other diseases of the nervous system and sense organs: Secondary | ICD-10-CM | POA: Diagnosis not present

## 2022-09-04 MED ORDER — IRBESARTAN 150 MG PO TABS
150.0000 mg | ORAL_TABLET | Freq: Every day | ORAL | 1 refills | Status: DC
Start: 2022-09-04 — End: 2023-02-05

## 2022-09-04 MED ORDER — POTASSIUM CHLORIDE CRYS ER 20 MEQ PO TBCR
20.0000 meq | EXTENDED_RELEASE_TABLET | Freq: Every day | ORAL | 1 refills | Status: DC
Start: 2022-09-04 — End: 2022-11-06

## 2022-09-04 MED ORDER — FUROSEMIDE 20 MG PO TABS
20.0000 mg | ORAL_TABLET | Freq: Every day | ORAL | 1 refills | Status: DC
Start: 2022-09-04 — End: 2022-11-06

## 2022-09-04 NOTE — Progress Notes (Signed)
   Established Patient Office Visit   Subjective  Patient ID: Terri Gray, female    DOB: [birth year]  Age: 67 y.o. MRN: [id number]  Chief Complaint  Patient presents with   Headache    Having lots of HA, they are bad. BP is not going down, bottom num is staying above 90. Has been doubling meds. Was supposed to have surgery on knee, and wants to get bp where it needs to be. HA starts in back of neck, triggers er migraine. Drinks lots of water, nothing is helping     Patient is a 67 year old female with HTN, OA, TMJ, myalgias, seasonal allergies, GERD, hypothyroidism, psoriasis who was seen today for follow-up on ongoing concern.  Patient endorses continued BP elevation.  Expresses concern about having knee surgery with BP elevation.  Tried taking extra doses of irbesartan but did not notice improvement in BP.  Previously on metoprolol however thought may be causing side effects.  Elevated BP causing increased neck and head pain leading to migraines.  Patient denies CP, palpitations, tachycardia.  Completed course of ABX for mild diffuse opacity on CXR on 08/23/2022 concerning for edema versus atypical/viral infection.  Patient endorses continued chest congestion and wheezing.  Using albuterol inhaler as needed.      ROS Negative unless stated above    Objective:     BP (!) 148/96 (BP Location: Left Arm, Patient Position: Sitting, Cuff Size: Normal)   Pulse 94   Temp 98.4 F (36.9 C) (Oral)   Wt 196 lb 6.4 oz (89.1 kg)   SpO2 98%   BMI 31.70 kg/m    Physical Exam Constitutional:      General: She is not in acute distress.    Appearance: Normal appearance.  HENT:     Head: Normocephalic and atraumatic.     Nose: Nose normal.     Mouth/Throat:     Mouth: Mucous membranes are moist.  Cardiovascular:     Rate and Rhythm: Normal rate and regular rhythm.     Heart sounds: Normal heart sounds. No murmur heard.    No gallop.  Pulmonary:     Effort: Pulmonary effort is  normal. No respiratory distress.     Breath sounds: Wheezing present. No rhonchi or rales.  Skin:    General: Skin is warm and dry.  Neurological:     Mental Status: She is alert and oriented to person, place, and time.      No results found for any visits on 09/04/22.    Assessment & Plan:  Essential hypertension -     Irbesartan; Take 1 tablet (150 mg total) by mouth daily.  Dispense: 90 tablet; Refill: 1 -     Furosemide; Take 1 tablet (20 mg total) by mouth daily.  Dispense: 30 tablet; Refill: 1 -     Potassium Chloride Crys ER; Take 1 tablet (20 mEq total) by mouth daily.  Dispense: 30 tablet; Refill: 1  History of migraine  Wheezing  Abnormal chest x-ray  BP uncontrolled.  Possibly 2/2 pain from right knee.  Will increase irbesartan from 75 to 150 mg daily.  Given abnormal CXR will start Lasix and Kdur.  Completed ABX for possible pneumonia.  Repeat CXR in 3-4 wks.  Continue other medications.  Return in 2 weeks (on 09/18/2022), or if symptoms worsen or fail to improve.   Deeann Saint, MD

## 2022-09-04 NOTE — Patient Instructions (Addendum)
A prescription for Lasix 20 mg and irbesartan 150 mg was sent to your pharmacy.  Continue to monitor your blood pressure daily.  As the Lasix may cause a decrease in your potassium a prescription for potassium supplement was also sent to the pharmacy.  The potassium pills are rather large and can be broken into pieces and/or dissolved in a little water instead of taken whole.

## 2022-09-25 ENCOUNTER — Other Ambulatory Visit: Payer: Self-pay | Admitting: Family Medicine

## 2022-09-25 DIAGNOSIS — K219 Gastro-esophageal reflux disease without esophagitis: Secondary | ICD-10-CM

## 2022-09-26 DIAGNOSIS — J45909 Unspecified asthma, uncomplicated: Secondary | ICD-10-CM | POA: Diagnosis not present

## 2022-09-26 DIAGNOSIS — Z888 Allergy status to other drugs, medicaments and biological substances status: Secondary | ICD-10-CM | POA: Diagnosis not present

## 2022-09-26 DIAGNOSIS — E1142 Type 2 diabetes mellitus with diabetic polyneuropathy: Secondary | ICD-10-CM | POA: Diagnosis not present

## 2022-09-26 DIAGNOSIS — Z7951 Long term (current) use of inhaled steroids: Secondary | ICD-10-CM | POA: Diagnosis not present

## 2022-09-26 DIAGNOSIS — I2510 Atherosclerotic heart disease of native coronary artery without angina pectoris: Secondary | ICD-10-CM | POA: Diagnosis not present

## 2022-09-26 DIAGNOSIS — Z794 Long term (current) use of insulin: Secondary | ICD-10-CM | POA: Diagnosis not present

## 2022-09-26 DIAGNOSIS — Z7984 Long term (current) use of oral hypoglycemic drugs: Secondary | ICD-10-CM | POA: Diagnosis not present

## 2022-09-26 DIAGNOSIS — K449 Diaphragmatic hernia without obstruction or gangrene: Secondary | ICD-10-CM | POA: Diagnosis not present

## 2022-09-26 DIAGNOSIS — Z471 Aftercare following joint replacement surgery: Secondary | ICD-10-CM | POA: Diagnosis not present

## 2022-09-26 DIAGNOSIS — M1711 Unilateral primary osteoarthritis, right knee: Secondary | ICD-10-CM | POA: Diagnosis not present

## 2022-09-26 DIAGNOSIS — G8918 Other acute postprocedural pain: Secondary | ICD-10-CM | POA: Diagnosis not present

## 2022-09-26 DIAGNOSIS — E785 Hyperlipidemia, unspecified: Secondary | ICD-10-CM | POA: Diagnosis not present

## 2022-09-26 DIAGNOSIS — E039 Hypothyroidism, unspecified: Secondary | ICD-10-CM | POA: Diagnosis not present

## 2022-09-26 DIAGNOSIS — Z79899 Other long term (current) drug therapy: Secondary | ICD-10-CM | POA: Diagnosis not present

## 2022-09-26 DIAGNOSIS — K219 Gastro-esophageal reflux disease without esophagitis: Secondary | ICD-10-CM | POA: Diagnosis not present

## 2022-09-26 DIAGNOSIS — I10 Essential (primary) hypertension: Secondary | ICD-10-CM | POA: Diagnosis not present

## 2022-09-26 DIAGNOSIS — Z96651 Presence of right artificial knee joint: Secondary | ICD-10-CM | POA: Diagnosis not present

## 2022-09-27 DIAGNOSIS — J45909 Unspecified asthma, uncomplicated: Secondary | ICD-10-CM | POA: Diagnosis not present

## 2022-09-27 DIAGNOSIS — E1169 Type 2 diabetes mellitus with other specified complication: Secondary | ICD-10-CM | POA: Diagnosis not present

## 2022-09-27 DIAGNOSIS — I10 Essential (primary) hypertension: Secondary | ICD-10-CM | POA: Diagnosis not present

## 2022-09-27 DIAGNOSIS — I2510 Atherosclerotic heart disease of native coronary artery without angina pectoris: Secondary | ICD-10-CM | POA: Diagnosis not present

## 2022-09-27 DIAGNOSIS — K449 Diaphragmatic hernia without obstruction or gangrene: Secondary | ICD-10-CM | POA: Diagnosis not present

## 2022-09-27 DIAGNOSIS — M1711 Unilateral primary osteoarthritis, right knee: Secondary | ICD-10-CM | POA: Diagnosis not present

## 2022-09-27 DIAGNOSIS — Z7984 Long term (current) use of oral hypoglycemic drugs: Secondary | ICD-10-CM | POA: Diagnosis not present

## 2022-09-27 DIAGNOSIS — K219 Gastro-esophageal reflux disease without esophagitis: Secondary | ICD-10-CM | POA: Diagnosis not present

## 2022-09-27 DIAGNOSIS — Z96651 Presence of right artificial knee joint: Secondary | ICD-10-CM | POA: Diagnosis not present

## 2022-09-27 DIAGNOSIS — I498 Other specified cardiac arrhythmias: Secondary | ICD-10-CM | POA: Diagnosis not present

## 2022-09-27 DIAGNOSIS — E039 Hypothyroidism, unspecified: Secondary | ICD-10-CM | POA: Diagnosis not present

## 2022-09-27 DIAGNOSIS — Z888 Allergy status to other drugs, medicaments and biological substances status: Secondary | ICD-10-CM | POA: Diagnosis not present

## 2022-09-27 DIAGNOSIS — Z79899 Other long term (current) drug therapy: Secondary | ICD-10-CM | POA: Diagnosis not present

## 2022-09-27 DIAGNOSIS — E1142 Type 2 diabetes mellitus with diabetic polyneuropathy: Secondary | ICD-10-CM | POA: Diagnosis not present

## 2022-09-27 DIAGNOSIS — Z7951 Long term (current) use of inhaled steroids: Secondary | ICD-10-CM | POA: Diagnosis not present

## 2022-09-27 DIAGNOSIS — I119 Hypertensive heart disease without heart failure: Secondary | ICD-10-CM | POA: Diagnosis not present

## 2022-09-27 DIAGNOSIS — R079 Chest pain, unspecified: Secondary | ICD-10-CM | POA: Diagnosis not present

## 2022-09-27 DIAGNOSIS — E785 Hyperlipidemia, unspecified: Secondary | ICD-10-CM | POA: Diagnosis not present

## 2022-09-27 DIAGNOSIS — Z794 Long term (current) use of insulin: Secondary | ICD-10-CM | POA: Diagnosis not present

## 2022-09-28 DIAGNOSIS — Z96651 Presence of right artificial knee joint: Secondary | ICD-10-CM | POA: Diagnosis not present

## 2022-09-28 DIAGNOSIS — Z471 Aftercare following joint replacement surgery: Secondary | ICD-10-CM | POA: Diagnosis not present

## 2022-10-02 DIAGNOSIS — Z96651 Presence of right artificial knee joint: Secondary | ICD-10-CM | POA: Diagnosis not present

## 2022-10-02 DIAGNOSIS — Z471 Aftercare following joint replacement surgery: Secondary | ICD-10-CM | POA: Diagnosis not present

## 2022-10-05 DIAGNOSIS — Z471 Aftercare following joint replacement surgery: Secondary | ICD-10-CM | POA: Diagnosis not present

## 2022-10-05 DIAGNOSIS — Z96651 Presence of right artificial knee joint: Secondary | ICD-10-CM | POA: Diagnosis not present

## 2022-10-06 DIAGNOSIS — Z96651 Presence of right artificial knee joint: Secondary | ICD-10-CM | POA: Diagnosis not present

## 2022-10-06 DIAGNOSIS — Z471 Aftercare following joint replacement surgery: Secondary | ICD-10-CM | POA: Diagnosis not present

## 2022-10-09 DIAGNOSIS — Z96651 Presence of right artificial knee joint: Secondary | ICD-10-CM | POA: Diagnosis not present

## 2022-10-09 DIAGNOSIS — Z471 Aftercare following joint replacement surgery: Secondary | ICD-10-CM | POA: Diagnosis not present

## 2022-10-11 DIAGNOSIS — Z96651 Presence of right artificial knee joint: Secondary | ICD-10-CM | POA: Diagnosis not present

## 2022-10-12 DIAGNOSIS — R29898 Other symptoms and signs involving the musculoskeletal system: Secondary | ICD-10-CM | POA: Diagnosis not present

## 2022-10-12 DIAGNOSIS — R262 Difficulty in walking, not elsewhere classified: Secondary | ICD-10-CM | POA: Diagnosis not present

## 2022-10-12 DIAGNOSIS — M25661 Stiffness of right knee, not elsewhere classified: Secondary | ICD-10-CM | POA: Diagnosis not present

## 2022-10-12 DIAGNOSIS — M2391 Unspecified internal derangement of right knee: Secondary | ICD-10-CM | POA: Diagnosis not present

## 2022-10-12 DIAGNOSIS — Z96651 Presence of right artificial knee joint: Secondary | ICD-10-CM | POA: Diagnosis not present

## 2022-10-12 DIAGNOSIS — Z471 Aftercare following joint replacement surgery: Secondary | ICD-10-CM | POA: Diagnosis not present

## 2022-10-17 DIAGNOSIS — R262 Difficulty in walking, not elsewhere classified: Secondary | ICD-10-CM | POA: Diagnosis not present

## 2022-10-17 DIAGNOSIS — Z471 Aftercare following joint replacement surgery: Secondary | ICD-10-CM | POA: Diagnosis not present

## 2022-10-17 DIAGNOSIS — R29898 Other symptoms and signs involving the musculoskeletal system: Secondary | ICD-10-CM | POA: Diagnosis not present

## 2022-10-17 DIAGNOSIS — M25661 Stiffness of right knee, not elsewhere classified: Secondary | ICD-10-CM | POA: Diagnosis not present

## 2022-10-17 DIAGNOSIS — M2391 Unspecified internal derangement of right knee: Secondary | ICD-10-CM | POA: Diagnosis not present

## 2022-10-17 DIAGNOSIS — Z96651 Presence of right artificial knee joint: Secondary | ICD-10-CM | POA: Diagnosis not present

## 2022-10-18 ENCOUNTER — Other Ambulatory Visit: Payer: Self-pay | Admitting: Family Medicine

## 2022-10-18 DIAGNOSIS — J3089 Other allergic rhinitis: Secondary | ICD-10-CM

## 2022-10-18 DIAGNOSIS — J4540 Moderate persistent asthma, uncomplicated: Secondary | ICD-10-CM

## 2022-10-24 ENCOUNTER — Other Ambulatory Visit: Payer: Self-pay

## 2022-10-24 DIAGNOSIS — Z79899 Other long term (current) drug therapy: Secondary | ICD-10-CM

## 2022-10-25 ENCOUNTER — Other Ambulatory Visit: Payer: Self-pay | Admitting: Family Medicine

## 2022-10-25 DIAGNOSIS — R262 Difficulty in walking, not elsewhere classified: Secondary | ICD-10-CM | POA: Diagnosis not present

## 2022-10-25 DIAGNOSIS — M2391 Unspecified internal derangement of right knee: Secondary | ICD-10-CM | POA: Diagnosis not present

## 2022-10-25 DIAGNOSIS — I10 Essential (primary) hypertension: Secondary | ICD-10-CM

## 2022-10-25 DIAGNOSIS — R079 Chest pain, unspecified: Secondary | ICD-10-CM | POA: Diagnosis not present

## 2022-10-25 DIAGNOSIS — M25661 Stiffness of right knee, not elsewhere classified: Secondary | ICD-10-CM | POA: Diagnosis not present

## 2022-10-25 DIAGNOSIS — Z96651 Presence of right artificial knee joint: Secondary | ICD-10-CM | POA: Diagnosis not present

## 2022-10-25 DIAGNOSIS — R29898 Other symptoms and signs involving the musculoskeletal system: Secondary | ICD-10-CM | POA: Diagnosis not present

## 2022-10-25 DIAGNOSIS — Z471 Aftercare following joint replacement surgery: Secondary | ICD-10-CM | POA: Diagnosis not present

## 2022-10-27 DIAGNOSIS — M2391 Unspecified internal derangement of right knee: Secondary | ICD-10-CM | POA: Diagnosis not present

## 2022-10-27 DIAGNOSIS — Z96651 Presence of right artificial knee joint: Secondary | ICD-10-CM | POA: Diagnosis not present

## 2022-10-27 DIAGNOSIS — Z471 Aftercare following joint replacement surgery: Secondary | ICD-10-CM | POA: Diagnosis not present

## 2022-10-27 DIAGNOSIS — M25661 Stiffness of right knee, not elsewhere classified: Secondary | ICD-10-CM | POA: Diagnosis not present

## 2022-10-27 DIAGNOSIS — R29898 Other symptoms and signs involving the musculoskeletal system: Secondary | ICD-10-CM | POA: Diagnosis not present

## 2022-10-27 DIAGNOSIS — R262 Difficulty in walking, not elsewhere classified: Secondary | ICD-10-CM | POA: Diagnosis not present

## 2022-10-31 DIAGNOSIS — R29898 Other symptoms and signs involving the musculoskeletal system: Secondary | ICD-10-CM | POA: Diagnosis not present

## 2022-10-31 DIAGNOSIS — M25661 Stiffness of right knee, not elsewhere classified: Secondary | ICD-10-CM | POA: Diagnosis not present

## 2022-10-31 DIAGNOSIS — Z96651 Presence of right artificial knee joint: Secondary | ICD-10-CM | POA: Diagnosis not present

## 2022-10-31 DIAGNOSIS — M2391 Unspecified internal derangement of right knee: Secondary | ICD-10-CM | POA: Diagnosis not present

## 2022-10-31 DIAGNOSIS — R262 Difficulty in walking, not elsewhere classified: Secondary | ICD-10-CM | POA: Diagnosis not present

## 2022-10-31 DIAGNOSIS — Z471 Aftercare following joint replacement surgery: Secondary | ICD-10-CM | POA: Diagnosis not present

## 2022-11-02 DIAGNOSIS — Z471 Aftercare following joint replacement surgery: Secondary | ICD-10-CM | POA: Diagnosis not present

## 2022-11-02 DIAGNOSIS — R29898 Other symptoms and signs involving the musculoskeletal system: Secondary | ICD-10-CM | POA: Diagnosis not present

## 2022-11-02 DIAGNOSIS — Z96651 Presence of right artificial knee joint: Secondary | ICD-10-CM | POA: Diagnosis not present

## 2022-11-02 DIAGNOSIS — R262 Difficulty in walking, not elsewhere classified: Secondary | ICD-10-CM | POA: Diagnosis not present

## 2022-11-02 DIAGNOSIS — M25661 Stiffness of right knee, not elsewhere classified: Secondary | ICD-10-CM | POA: Diagnosis not present

## 2022-11-02 DIAGNOSIS — M2391 Unspecified internal derangement of right knee: Secondary | ICD-10-CM | POA: Diagnosis not present

## 2022-11-06 ENCOUNTER — Other Ambulatory Visit: Payer: Self-pay | Admitting: Family Medicine

## 2022-11-06 DIAGNOSIS — R29898 Other symptoms and signs involving the musculoskeletal system: Secondary | ICD-10-CM | POA: Diagnosis not present

## 2022-11-06 DIAGNOSIS — I10 Essential (primary) hypertension: Secondary | ICD-10-CM

## 2022-11-06 DIAGNOSIS — Z96651 Presence of right artificial knee joint: Secondary | ICD-10-CM | POA: Diagnosis not present

## 2022-11-06 DIAGNOSIS — R262 Difficulty in walking, not elsewhere classified: Secondary | ICD-10-CM | POA: Diagnosis not present

## 2022-11-06 DIAGNOSIS — M2391 Unspecified internal derangement of right knee: Secondary | ICD-10-CM | POA: Diagnosis not present

## 2022-11-06 DIAGNOSIS — M25661 Stiffness of right knee, not elsewhere classified: Secondary | ICD-10-CM | POA: Diagnosis not present

## 2022-11-06 DIAGNOSIS — Z471 Aftercare following joint replacement surgery: Secondary | ICD-10-CM | POA: Diagnosis not present

## 2022-11-08 DIAGNOSIS — E782 Mixed hyperlipidemia: Secondary | ICD-10-CM | POA: Diagnosis not present

## 2022-11-08 DIAGNOSIS — I10 Essential (primary) hypertension: Secondary | ICD-10-CM | POA: Diagnosis not present

## 2022-11-08 DIAGNOSIS — E1142 Type 2 diabetes mellitus with diabetic polyneuropathy: Secondary | ICD-10-CM | POA: Diagnosis not present

## 2022-11-08 DIAGNOSIS — Z794 Long term (current) use of insulin: Secondary | ICD-10-CM | POA: Diagnosis not present

## 2022-11-08 DIAGNOSIS — E039 Hypothyroidism, unspecified: Secondary | ICD-10-CM | POA: Diagnosis not present

## 2022-11-09 DIAGNOSIS — M25661 Stiffness of right knee, not elsewhere classified: Secondary | ICD-10-CM | POA: Diagnosis not present

## 2022-11-09 DIAGNOSIS — R29898 Other symptoms and signs involving the musculoskeletal system: Secondary | ICD-10-CM | POA: Diagnosis not present

## 2022-11-09 DIAGNOSIS — Z471 Aftercare following joint replacement surgery: Secondary | ICD-10-CM | POA: Diagnosis not present

## 2022-11-09 DIAGNOSIS — Z96651 Presence of right artificial knee joint: Secondary | ICD-10-CM | POA: Diagnosis not present

## 2022-11-09 DIAGNOSIS — M2391 Unspecified internal derangement of right knee: Secondary | ICD-10-CM | POA: Diagnosis not present

## 2022-11-09 DIAGNOSIS — R262 Difficulty in walking, not elsewhere classified: Secondary | ICD-10-CM | POA: Diagnosis not present

## 2022-11-11 DIAGNOSIS — R000 Tachycardia, unspecified: Secondary | ICD-10-CM | POA: Diagnosis not present

## 2022-11-14 DIAGNOSIS — R262 Difficulty in walking, not elsewhere classified: Secondary | ICD-10-CM | POA: Diagnosis not present

## 2022-11-14 DIAGNOSIS — M25661 Stiffness of right knee, not elsewhere classified: Secondary | ICD-10-CM | POA: Diagnosis not present

## 2022-11-14 DIAGNOSIS — Z471 Aftercare following joint replacement surgery: Secondary | ICD-10-CM | POA: Diagnosis not present

## 2022-11-14 DIAGNOSIS — R29898 Other symptoms and signs involving the musculoskeletal system: Secondary | ICD-10-CM | POA: Diagnosis not present

## 2022-11-14 DIAGNOSIS — Z96651 Presence of right artificial knee joint: Secondary | ICD-10-CM | POA: Diagnosis not present

## 2022-11-14 DIAGNOSIS — M2391 Unspecified internal derangement of right knee: Secondary | ICD-10-CM | POA: Diagnosis not present

## 2022-11-21 ENCOUNTER — Other Ambulatory Visit: Payer: Self-pay | Admitting: Family Medicine

## 2022-11-21 DIAGNOSIS — R262 Difficulty in walking, not elsewhere classified: Secondary | ICD-10-CM | POA: Diagnosis not present

## 2022-11-21 DIAGNOSIS — R29898 Other symptoms and signs involving the musculoskeletal system: Secondary | ICD-10-CM | POA: Diagnosis not present

## 2022-11-21 DIAGNOSIS — M25661 Stiffness of right knee, not elsewhere classified: Secondary | ICD-10-CM | POA: Diagnosis not present

## 2022-11-21 DIAGNOSIS — Z471 Aftercare following joint replacement surgery: Secondary | ICD-10-CM | POA: Diagnosis not present

## 2022-11-21 DIAGNOSIS — M2391 Unspecified internal derangement of right knee: Secondary | ICD-10-CM | POA: Diagnosis not present

## 2022-11-21 DIAGNOSIS — K219 Gastro-esophageal reflux disease without esophagitis: Secondary | ICD-10-CM

## 2022-11-21 DIAGNOSIS — Z96651 Presence of right artificial knee joint: Secondary | ICD-10-CM | POA: Diagnosis not present

## 2022-11-24 DIAGNOSIS — R29898 Other symptoms and signs involving the musculoskeletal system: Secondary | ICD-10-CM | POA: Diagnosis not present

## 2022-11-24 DIAGNOSIS — Z471 Aftercare following joint replacement surgery: Secondary | ICD-10-CM | POA: Diagnosis not present

## 2022-11-24 DIAGNOSIS — Z96651 Presence of right artificial knee joint: Secondary | ICD-10-CM | POA: Diagnosis not present

## 2022-11-24 DIAGNOSIS — R262 Difficulty in walking, not elsewhere classified: Secondary | ICD-10-CM | POA: Diagnosis not present

## 2022-11-24 DIAGNOSIS — M25661 Stiffness of right knee, not elsewhere classified: Secondary | ICD-10-CM | POA: Diagnosis not present

## 2022-11-24 DIAGNOSIS — M2391 Unspecified internal derangement of right knee: Secondary | ICD-10-CM | POA: Diagnosis not present

## 2022-11-27 DIAGNOSIS — M25661 Stiffness of right knee, not elsewhere classified: Secondary | ICD-10-CM | POA: Diagnosis not present

## 2022-11-27 DIAGNOSIS — Z471 Aftercare following joint replacement surgery: Secondary | ICD-10-CM | POA: Diagnosis not present

## 2022-11-27 DIAGNOSIS — R29898 Other symptoms and signs involving the musculoskeletal system: Secondary | ICD-10-CM | POA: Diagnosis not present

## 2022-11-27 DIAGNOSIS — Z96651 Presence of right artificial knee joint: Secondary | ICD-10-CM | POA: Diagnosis not present

## 2022-11-27 DIAGNOSIS — R262 Difficulty in walking, not elsewhere classified: Secondary | ICD-10-CM | POA: Diagnosis not present

## 2022-11-27 DIAGNOSIS — M2391 Unspecified internal derangement of right knee: Secondary | ICD-10-CM | POA: Diagnosis not present

## 2022-11-30 DIAGNOSIS — M25661 Stiffness of right knee, not elsewhere classified: Secondary | ICD-10-CM | POA: Diagnosis not present

## 2022-11-30 DIAGNOSIS — Z471 Aftercare following joint replacement surgery: Secondary | ICD-10-CM | POA: Diagnosis not present

## 2022-11-30 DIAGNOSIS — M2391 Unspecified internal derangement of right knee: Secondary | ICD-10-CM | POA: Diagnosis not present

## 2022-11-30 DIAGNOSIS — R29898 Other symptoms and signs involving the musculoskeletal system: Secondary | ICD-10-CM | POA: Diagnosis not present

## 2022-11-30 DIAGNOSIS — R262 Difficulty in walking, not elsewhere classified: Secondary | ICD-10-CM | POA: Diagnosis not present

## 2022-11-30 DIAGNOSIS — Z96651 Presence of right artificial knee joint: Secondary | ICD-10-CM | POA: Diagnosis not present

## 2022-12-01 ENCOUNTER — Telehealth (INDEPENDENT_AMBULATORY_CARE_PROVIDER_SITE_OTHER): Payer: Medicare Other | Admitting: Family Medicine

## 2022-12-01 ENCOUNTER — Encounter: Payer: Self-pay | Admitting: Family Medicine

## 2022-12-01 DIAGNOSIS — L280 Lichen simplex chronicus: Secondary | ICD-10-CM

## 2022-12-01 DIAGNOSIS — L299 Pruritus, unspecified: Secondary | ICD-10-CM

## 2022-12-01 DIAGNOSIS — L500 Allergic urticaria: Secondary | ICD-10-CM

## 2022-12-01 MED ORDER — PREDNISONE 10 MG PO TABS
ORAL_TABLET | ORAL | 0 refills | Status: DC
Start: 2022-12-01 — End: 2023-05-15

## 2022-12-01 NOTE — Progress Notes (Signed)
Virtual Visit via Video Note  I connected with Terri Gray on 12/01/22 at  2:00 PM EDT by a video enabled telemedicine application and verified that I am speaking with the correct person using two identifiers.  Location patient: home Location provider:work or home office Persons participating in the virtual visit: patient, provider  I discussed the limitations of evaluation and management by telemedicine and the availability of in person appointments. The patient expressed understanding and agreed to proceed.  Chief Complaint  Patient presents with   welts    Red welts all over body, lips swollen, benadryl does not help. Derm gave medication but it is not helping.    HPI: Patient seen for acute concern. Pt states she was taking MTX for RA, but ran out x3 wk ago.  Was advised to f/u with rheum for refills.  Now having itching, burning of hairline, edema of lip, welps on low back, edema of various areas of skin, and joint pain.  Pt tried benadryl, singulair, and other meds without relief.  Pt has been unable to sleep due to the pruritus.  Pt had R TKR surgery a few months ago.  Pt states surgery had to be pushed back 2 hrs due to hypoglycemia.  States in recovery there was "something with her heart on the monitor"/palpitations.  Had a holter monitor and ECHO which was negative.    Pt still has not heard anything about new Rheum referral to [facility] in [facility] which was placed in April 2024.  ROS: See pertinent positives and negatives per HPI.  Past Medical History:  Diagnosis Date   Allergy    Arthritis    Asthma    Chicken pox    Colon polyps    Diabetes mellitus without complication (HCC)    GERD (gastroesophageal reflux disease)    H/O fracture of nose    Broken by ENT   History of colon polyps    Hyperlipidemia    Hypertension    IBS (irritable bowel syndrome)    Migraines    Obesity    Shingles 2012   Sleep apnea    on CPAP machine but uses it 90% of the time  because it gives her some complication and does a mouth guard as well   Thyroid disease     Past Surgical History:  Procedure Laterality Date   COLONOSCOPY     Around 2014- 2015 in [place]   ESOPHAGOGASTRODUODENOSCOPY     Around 2014-2015 [place]   REPLACEMENT TOTAL KNEE Left 2016    Family History  Problem Relation Age of Onset   Bronchitis Mother    Ovarian cancer Mother 5   GER disease Mother    Colon polyps Mother    Irritable bowel syndrome Mother    Diabetes Father    Kidney disease Father    Heart failure Father    Irritable bowel syndrome Father    Diabetes Sister    Asthma Sister    Urticaria Sister    Diabetes Sister    Allergic rhinitis Daughter    GER disease Daughter    Irritable bowel syndrome Daughter    Irritable bowel syndrome Daughter    Diabetes Brother    Allergic rhinitis Son    Allergic rhinitis Grandchild    Food Allergy Grandchild    Immunodeficiency Neg Hx    Angioedema Neg Hx    Breast cancer Neg Hx    Current Outpatient Medications:    albuterol (VENTOLIN HFA) 108 (  90 Base) MCG/ACT inhaler, INHALE TWO PUFFS BY MOUTH EVERY 6 HOURS AS NEEDED FOR SHORTNESS OF BREATH OR WHEEZING, Disp: 8.5 g, Rfl: 1   budesonide-formoterol (SYMBICORT) 160-4.5 MCG/ACT inhaler, Inhale 2 puffs into the lungs in the morning and at bedtime., Disp: 1 each, Rfl: 6   cyclobenzaprine (FLEXERIL) 10 MG tablet, TAKE ONE TABLET BY MOUTH THREE TIMES A DAY AS NEEDED FOR MUSCLE SPASMS, Disp: 90 tablet, Rfl: 1   esomeprazole (NEXIUM) 40 MG capsule, TAKE 1 CAPSULE BY MOUTH TWICE A DAY FOR 28 DAYS THEN TAKE 1 CAPSULE BY MOUTH DAILY, Disp: 90 capsule, Rfl: 1   ferrous sulfate 325 (65 FE) MG tablet, Take one tablet by mouth every other day, with a full glass of water., Disp: , Rfl:    fluticasone (FLONASE) 50 MCG/ACT nasal spray, Place 1-2 sprays into both nostrils daily as needed., Disp: 16 g, Rfl: 5   folic acid (FOLVITE) 1 MG tablet, Take 1 mg by mouth daily.,  Disp: , Rfl:    folic acid (FOLVITE) 1 MG tablet, Take 1 tablet by mouth daily., Disp: , Rfl:    furosemide (LASIX) 20 MG tablet, TAKE 1 TABLET BY MOUTH DAILY, Disp: 30 tablet, Rfl: 1   glucose blood (TRUE METRIX BLOOD GLUCOSE TEST) test strip, 1 each by Other route 4 (four) times daily as needed for other. And lancets 2/day, Disp: 360 each, Rfl: 3   hydrocortisone valerate ointment (WESTCORT) 0.2 %, Apply 1 application topically 2 (two) times daily., Disp: 60 g, Rfl: 1   ibuprofen (ADVIL) 200 MG tablet, Take 200 mg by mouth every 8 (eight) hours as needed., Disp: , Rfl:    irbesartan (AVAPRO) 150 MG tablet, Take 1 tablet (150 mg total) by mouth daily., Disp: 90 tablet, Rfl: 1   KLOR-CON M20 20 MEQ tablet, TAKE 1 TABLET BY MOUTH DAILY, Disp: 30 tablet, Rfl: 1   LANTUS SOLOSTAR 100 UNIT/ML Solostar Pen, INJECT 85 UNITS SUBCUTANEOUSLY ONCE DAILY IN THE MORNING, Disp: 75 mL, Rfl: 0   levothyroxine (SYNTHROID) 75 MCG tablet, Take 1 tablet (75 mcg total) by mouth daily before breakfast., Disp: 90 tablet, Rfl: 3   meloxicam (MOBIC) 15 MG tablet, Take 15 mg by mouth daily., Disp: , Rfl:    metFORMIN (GLUCOPHAGE) 1000 MG tablet, Take 1 tablet (1,000 mg total) by mouth 2 (two) times daily with a meal., Disp: 180 tablet, Rfl: 3   methotrexate (RHEUMATREX) 2.5 MG tablet, Take by mouth., Disp: , Rfl:    mometasone (ELOCON) 0.1 % cream, Apply topically., Disp: , Rfl:    montelukast (SINGULAIR) 10 MG tablet, TAKE 1 TABLET BY MOUTH AT BEDTIME, Disp: 90 tablet, Rfl: 3   Olopatadine HCl (PATADAY) 0.2 % SOLN, Place 1 drop into both eyes daily as needed., Disp: 2.5 mL, Rfl: 5   pimecrolimus (ELIDEL) 1 % cream, Apply twice a day on affected areas, Disp: , Rfl:    Semaglutide,0.25 or 0.5MG /DOS, 2 MG/1.5ML SOPN, Inject into the skin., Disp: , Rfl:    simvastatin (ZOCOR) 40 MG tablet, Take 1 tablet (40 mg total) by mouth daily., Disp: 90 tablet, Rfl: 3   amoxicillin-clavulanate (AUGMENTIN) 875-125 MG tablet, Take 1  tablet by mouth 2 (two) times daily., Disp: 14 tablet, Rfl: 0  EXAM:  VITALS per patient if applicable: RR between 12-20 bpm.  GENERAL: alert, oriented, appears well and in no acute distress  HEENT: atraumatic, conjunctiva clear, no obvious abnormalities on inspection of external nose and ears  NECK: normal movements of  the head and neck  LUNGS: on inspection no signs of respiratory distress, breathing rate appears normal, no obvious gross SOB, gasping or wheezing  CV: no obvious cyanosis  MS: moves all visible extremities without noticeable abnormality  PSYCH/NEURO: pleasant and cooperative, no obvious depression or anxiety, speech and thought processing grossly intact  ASSESSMENT AND PLAN:  Discussed the following assessment and plan:  Pruritus - Plan: predniSONE (DELTASONE) 10 MG tablet  Urticaria - Plan: predniSONE (DELTASONE) 10 MG tablet  Lichenoid dermatitis  Derm symptoms after unable to get refill of methotrexate-last prescribed by [facility]ology 08/2022.  Discussed prednisone taper per current symptoms. Continue OTC supportive care with antihistamines, creams, etc.    Trying to reach referral coordinator/team in regards to patient's rheumatology referral placed in April 2024.  Per chart review referral has been authorized but no follow-up information available.   Given strict precautions.   I discussed the assessment and treatment plan with the patient. The patient was provided an opportunity to ask questions and all were answered. The patient agreed with the plan and demonstrated an understanding of the instructions.   The patient was advised to call back or seek an in-person evaluation if the symptoms worsen or if the condition fails to improve as anticipated.   Deeann Saint, MD

## 2022-12-06 DIAGNOSIS — L509 Urticaria, unspecified: Secondary | ICD-10-CM | POA: Diagnosis not present

## 2022-12-06 DIAGNOSIS — L280 Lichen simplex chronicus: Secondary | ICD-10-CM | POA: Diagnosis not present

## 2022-12-21 DIAGNOSIS — M2391 Unspecified internal derangement of right knee: Secondary | ICD-10-CM | POA: Diagnosis not present

## 2022-12-21 DIAGNOSIS — Z471 Aftercare following joint replacement surgery: Secondary | ICD-10-CM | POA: Diagnosis not present

## 2022-12-21 DIAGNOSIS — Z96651 Presence of right artificial knee joint: Secondary | ICD-10-CM | POA: Diagnosis not present

## 2022-12-21 DIAGNOSIS — M25661 Stiffness of right knee, not elsewhere classified: Secondary | ICD-10-CM | POA: Diagnosis not present

## 2022-12-21 DIAGNOSIS — R29898 Other symptoms and signs involving the musculoskeletal system: Secondary | ICD-10-CM | POA: Diagnosis not present

## 2022-12-21 DIAGNOSIS — R262 Difficulty in walking, not elsewhere classified: Secondary | ICD-10-CM | POA: Diagnosis not present

## 2022-12-27 ENCOUNTER — Other Ambulatory Visit: Payer: Self-pay | Admitting: Family Medicine

## 2022-12-27 DIAGNOSIS — E119 Type 2 diabetes mellitus without complications: Secondary | ICD-10-CM | POA: Diagnosis not present

## 2022-12-27 DIAGNOSIS — M26609 Unspecified temporomandibular joint disorder, unspecified side: Secondary | ICD-10-CM

## 2023-01-03 ENCOUNTER — Other Ambulatory Visit: Payer: Self-pay | Admitting: Family Medicine

## 2023-01-03 DIAGNOSIS — I10 Essential (primary) hypertension: Secondary | ICD-10-CM

## 2023-01-04 ENCOUNTER — Other Ambulatory Visit: Payer: Self-pay | Admitting: Family Medicine

## 2023-01-04 DIAGNOSIS — Z0000 Encounter for general adult medical examination without abnormal findings: Secondary | ICD-10-CM

## 2023-01-09 DIAGNOSIS — H25812 Combined forms of age-related cataract, left eye: Secondary | ICD-10-CM | POA: Diagnosis not present

## 2023-01-09 DIAGNOSIS — H25811 Combined forms of age-related cataract, right eye: Secondary | ICD-10-CM | POA: Diagnosis not present

## 2023-01-12 IMAGING — RF DG ESOPHAGUS
3 series · 14 of 16 positions shown · non-contrast
Comparison: None.

CLINICAL DATA: Pharyngeal dysphagia. Food sticking in throat.
Choking and coughing.

EXAM:
ESOPHOGRAM / BARIUM SWALLOW / BARIUM TABLET STUDY
TECHNIQUE: Combined double contrast and single contrast examination performed
using effervescent crystals, thick barium liquid, and thin barium
liquid. The patient was observed with fluoroscopy swallowing a 13 mm
barium sulphate tablet.
FLUOROSCOPY:
Radiation Exposure Index (as provided by the fluoroscopic device):
26.2 mGy Kerma

[Series 1: sequence · 3 of 23 frames shown (1 of 2)]
[frame 3/23]
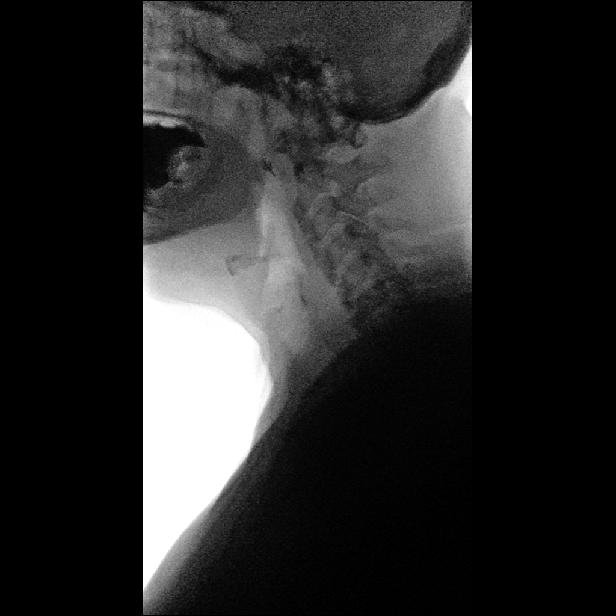
[frame 4/23]
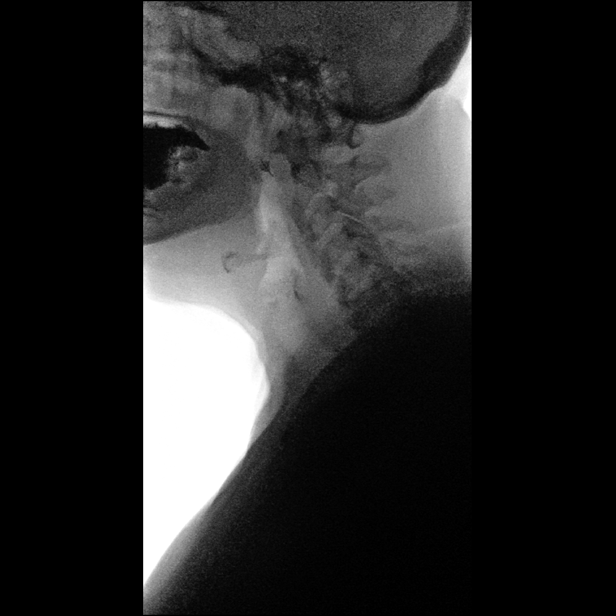
[frame 12/23]
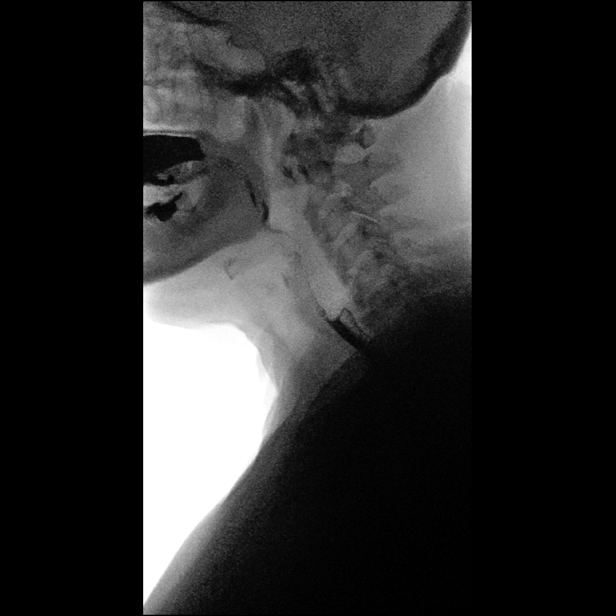

[Series 2: sequence · 4 of 21 frames shown (2 of 2)]
[frame 4/21]
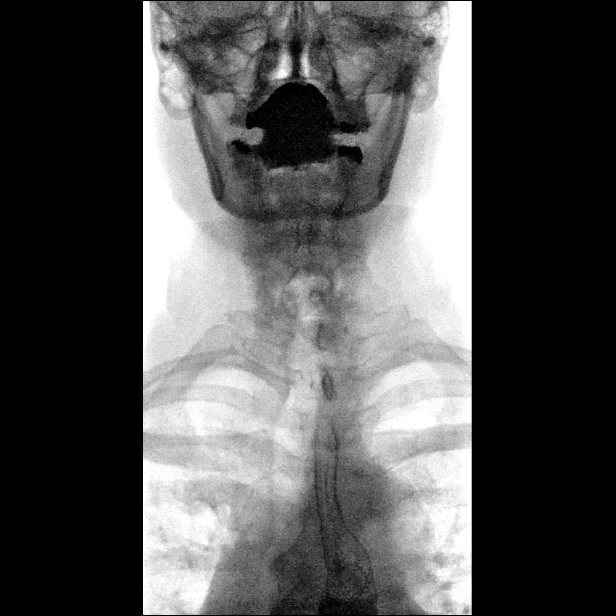
[frame 9/21]
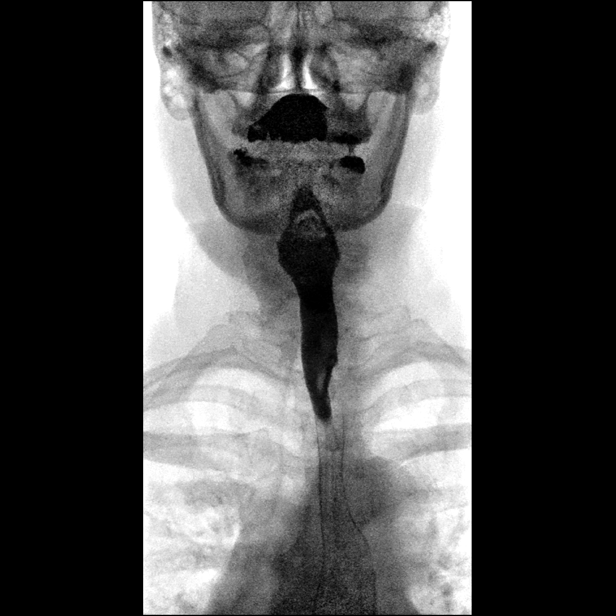
[frame 11/21]
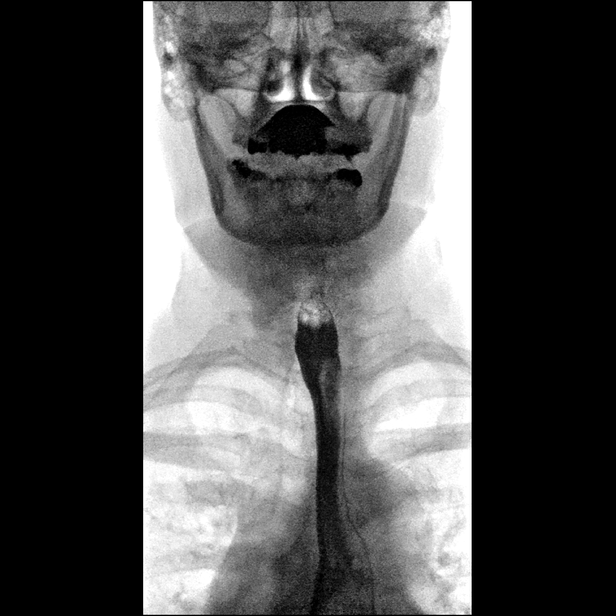
[frame 18/21]
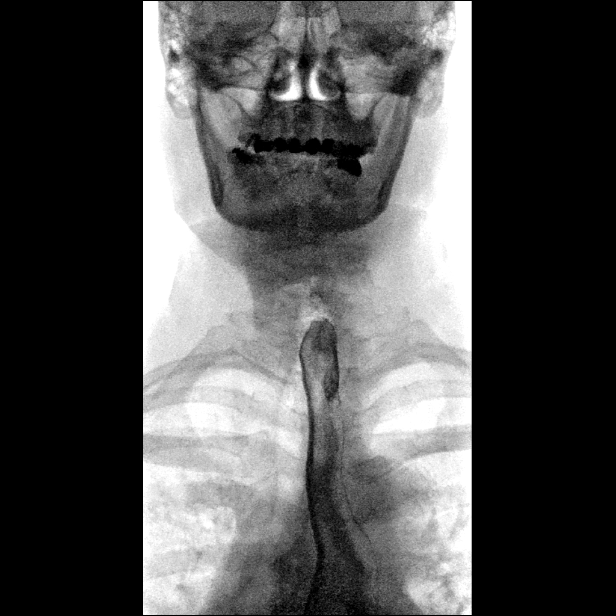

[Series 3: one shot · 7 of 8 slices shown]
[im 1/8]
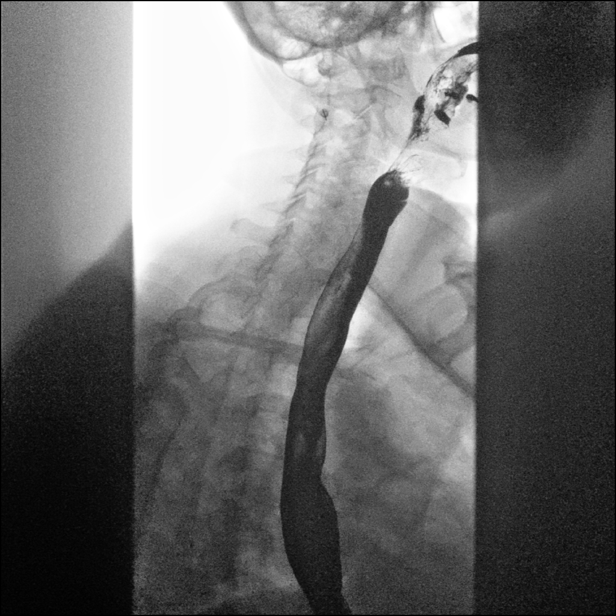
[im 2/8]
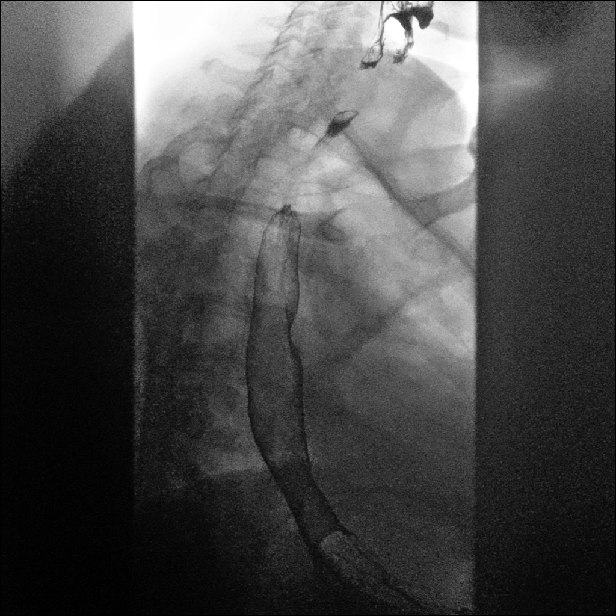
[im 3/8]
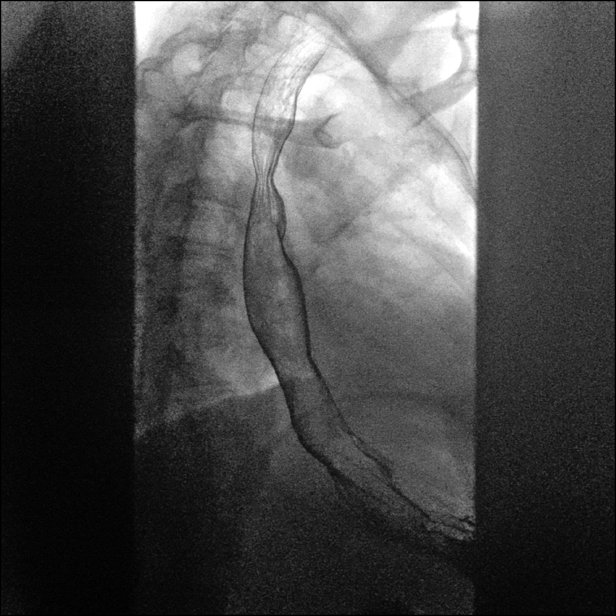
[im 5/8]
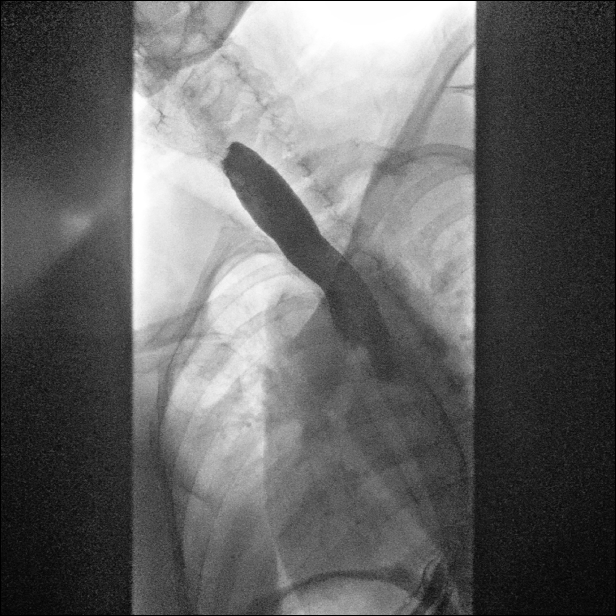
[im 6/8]
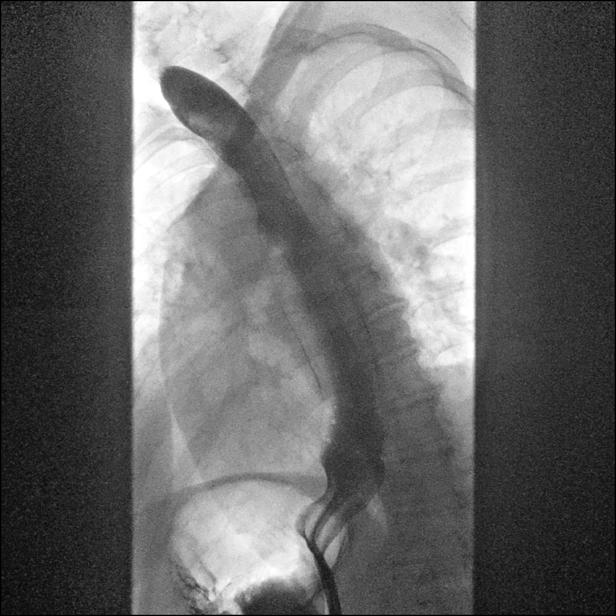
[im 7/8]
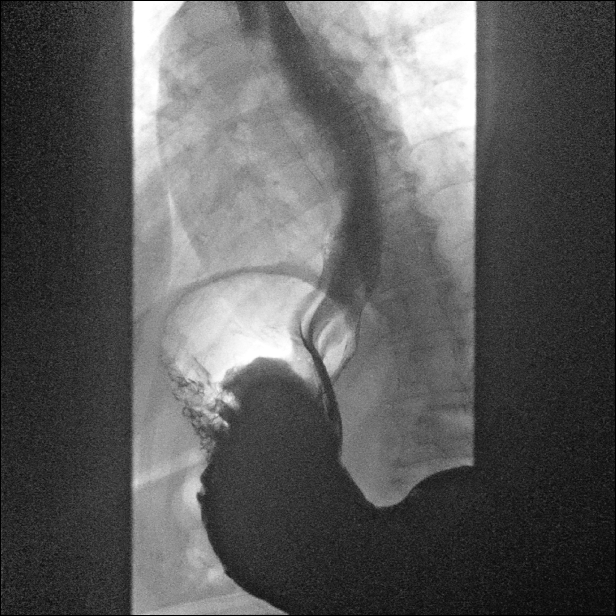
[im 8/8]
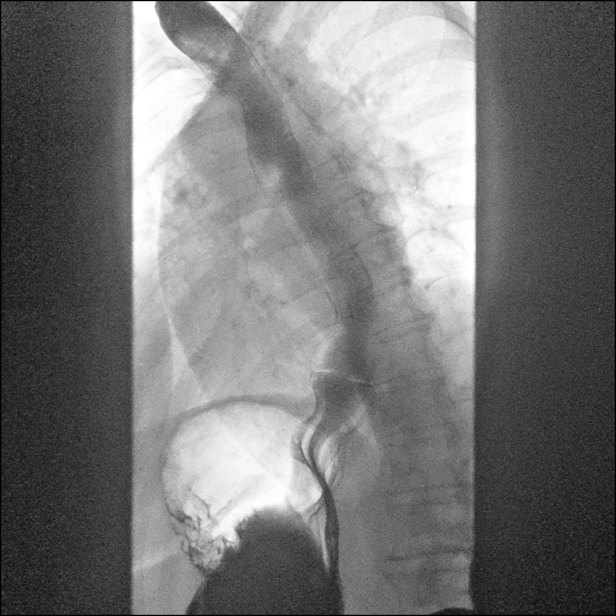

[14 of 16 positions shown; findings below may reference images not displayed]

FINDINGS: No evidence of vestibular penetration or aspiration during
swallowing. Pharynx and cervical esophagus are unremarkable.

No evidence of esophageal mass or stricture. No findings of
esophagitis noted. Mild esophageal dysmotility is seen, with break
up of primary peristalsis causing esophageal stasis.

A small sliding hiatal hernia is noted. No evidence of
gastroesophageal reflux. An ingested 13mm barium tablet passed
freely through the esophagus, and into the stomach.
IMPRESSION: Small sliding hiatal hernia. No evidence of esophageal stricture or
gastroesophageal reflux.

Mild esophageal dysmotility.

## 2023-01-18 ENCOUNTER — Ambulatory Visit
Admission: RE | Admit: 2023-01-18 | Discharge: 2023-01-18 | Disposition: A | Discharge status: Home / Self Care | Payer: Medicare Other | Source: Ambulatory Visit | Attending: Family Medicine | Admitting: Family Medicine

## 2023-01-18 DIAGNOSIS — Z1231 Encounter for screening mammogram for malignant neoplasm of breast: Secondary | ICD-10-CM | POA: Diagnosis not present

## 2023-01-18 DIAGNOSIS — Z0000 Encounter for general adult medical examination without abnormal findings: Secondary | ICD-10-CM

## 2023-02-05 ENCOUNTER — Other Ambulatory Visit: Payer: Self-pay | Admitting: Family Medicine

## 2023-02-05 DIAGNOSIS — I10 Essential (primary) hypertension: Secondary | ICD-10-CM

## 2023-02-05 DIAGNOSIS — H25811 Combined forms of age-related cataract, right eye: Secondary | ICD-10-CM | POA: Diagnosis not present

## 2023-02-05 DIAGNOSIS — K219 Gastro-esophageal reflux disease without esophagitis: Secondary | ICD-10-CM

## 2023-02-13 DIAGNOSIS — I471 Supraventricular tachycardia, unspecified: Secondary | ICD-10-CM | POA: Diagnosis not present

## 2023-02-13 DIAGNOSIS — R002 Palpitations: Secondary | ICD-10-CM | POA: Diagnosis not present

## 2023-02-15 DIAGNOSIS — R002 Palpitations: Secondary | ICD-10-CM | POA: Diagnosis not present

## 2023-02-16 DIAGNOSIS — Z471 Aftercare following joint replacement surgery: Secondary | ICD-10-CM | POA: Diagnosis not present

## 2023-02-16 DIAGNOSIS — Z96651 Presence of right artificial knee joint: Secondary | ICD-10-CM | POA: Diagnosis not present

## 2023-02-19 DIAGNOSIS — H25812 Combined forms of age-related cataract, left eye: Secondary | ICD-10-CM | POA: Diagnosis not present

## 2023-02-19 DIAGNOSIS — H269 Unspecified cataract: Secondary | ICD-10-CM | POA: Diagnosis not present

## 2023-02-23 DIAGNOSIS — I471 Supraventricular tachycardia, unspecified: Secondary | ICD-10-CM | POA: Diagnosis not present

## 2023-02-23 DIAGNOSIS — R002 Palpitations: Secondary | ICD-10-CM | POA: Diagnosis not present

## 2023-02-28 DIAGNOSIS — Z79899 Other long term (current) drug therapy: Secondary | ICD-10-CM | POA: Diagnosis not present

## 2023-03-02 DIAGNOSIS — Z471 Aftercare following joint replacement surgery: Secondary | ICD-10-CM | POA: Diagnosis not present

## 2023-03-02 DIAGNOSIS — M1711 Unilateral primary osteoarthritis, right knee: Secondary | ICD-10-CM | POA: Diagnosis not present

## 2023-03-02 DIAGNOSIS — Z96651 Presence of right artificial knee joint: Secondary | ICD-10-CM | POA: Diagnosis not present

## 2023-03-07 DIAGNOSIS — I471 Supraventricular tachycardia, unspecified: Secondary | ICD-10-CM | POA: Diagnosis not present

## 2023-03-07 DIAGNOSIS — R002 Palpitations: Secondary | ICD-10-CM | POA: Diagnosis not present

## 2023-03-09 DIAGNOSIS — Z471 Aftercare following joint replacement surgery: Secondary | ICD-10-CM | POA: Diagnosis not present

## 2023-03-09 DIAGNOSIS — Z96651 Presence of right artificial knee joint: Secondary | ICD-10-CM | POA: Diagnosis not present

## 2023-03-29 DIAGNOSIS — E782 Mixed hyperlipidemia: Secondary | ICD-10-CM | POA: Diagnosis not present

## 2023-03-29 DIAGNOSIS — I10 Essential (primary) hypertension: Secondary | ICD-10-CM | POA: Diagnosis not present

## 2023-03-29 DIAGNOSIS — Z794 Long term (current) use of insulin: Secondary | ICD-10-CM | POA: Diagnosis not present

## 2023-03-29 DIAGNOSIS — E039 Hypothyroidism, unspecified: Secondary | ICD-10-CM | POA: Diagnosis not present

## 2023-03-29 DIAGNOSIS — M069 Rheumatoid arthritis, unspecified: Secondary | ICD-10-CM | POA: Diagnosis not present

## 2023-03-29 DIAGNOSIS — E119 Type 2 diabetes mellitus without complications: Secondary | ICD-10-CM | POA: Diagnosis not present

## 2023-03-30 ENCOUNTER — Encounter: Payer: Self-pay | Admitting: Family Medicine

## 2023-03-30 LAB — HEMOGLOBIN A1C: Hemoglobin A1C: 5.9

## 2023-03-31 ENCOUNTER — Other Ambulatory Visit: Payer: Self-pay | Admitting: Family Medicine

## 2023-03-31 DIAGNOSIS — M26609 Unspecified temporomandibular joint disorder, unspecified side: Secondary | ICD-10-CM

## 2023-04-01 IMAGING — DX DG SHOULDER 2+V*R*
3 series · 3 of 3 positions shown · non-contrast
Comparison: None Available.

CLINICAL DATA: Right shoulder pain

EXAM:
RIGHT SHOULDER - 2+ VIEW

[shoulder internal rotation ap]
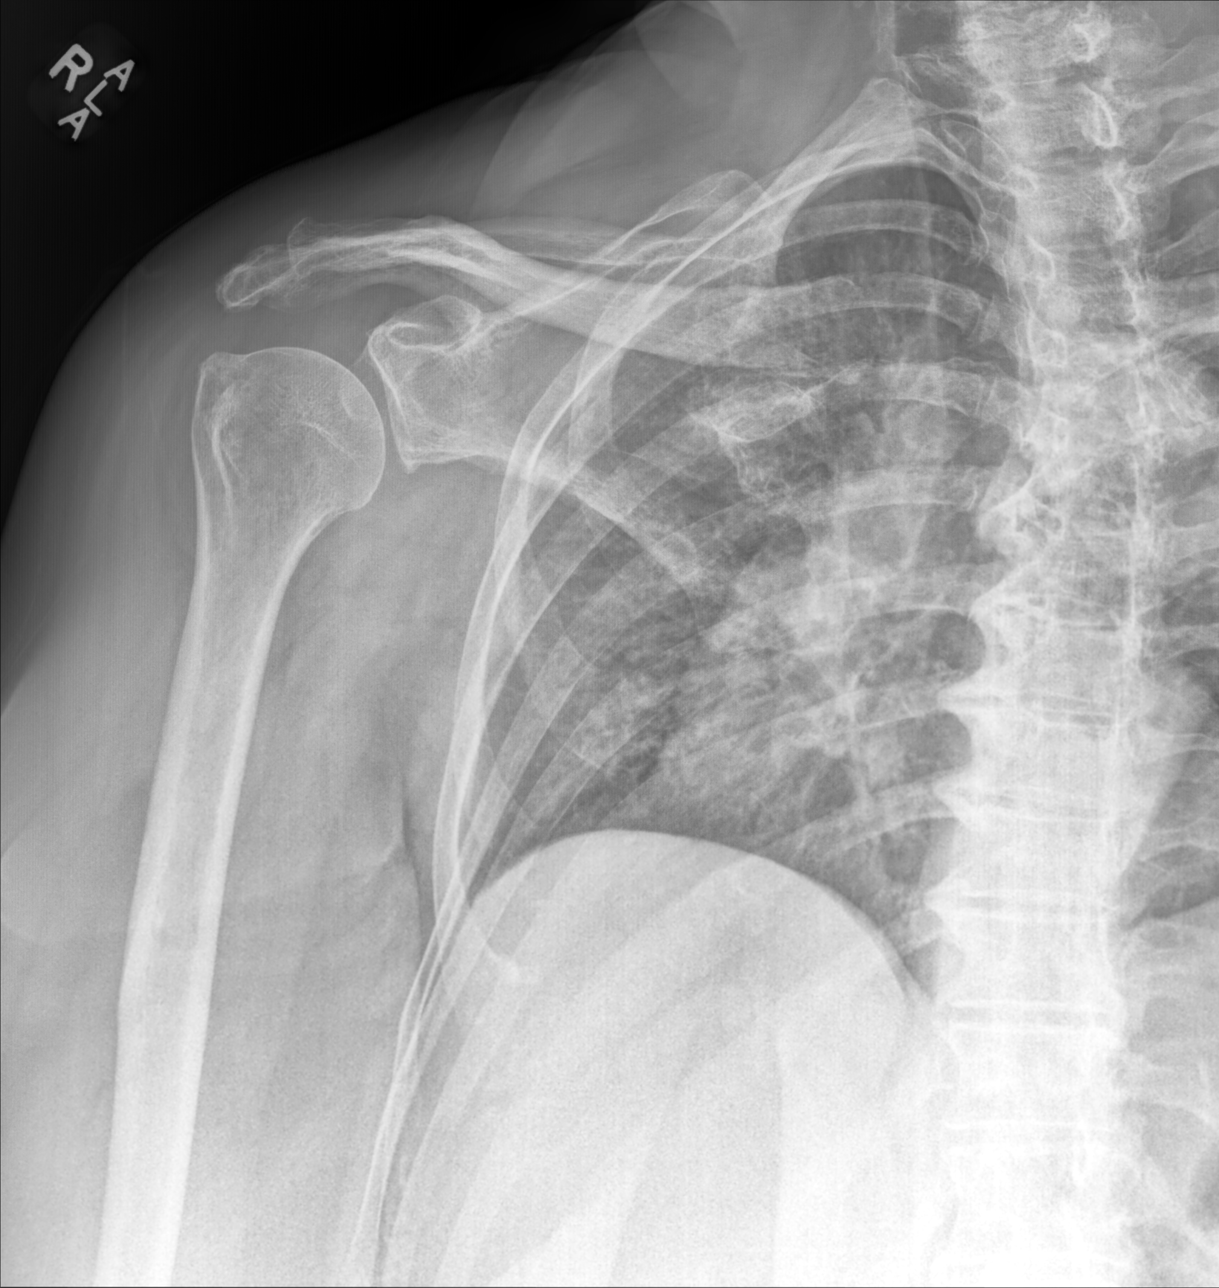

[shoulder (y view)]
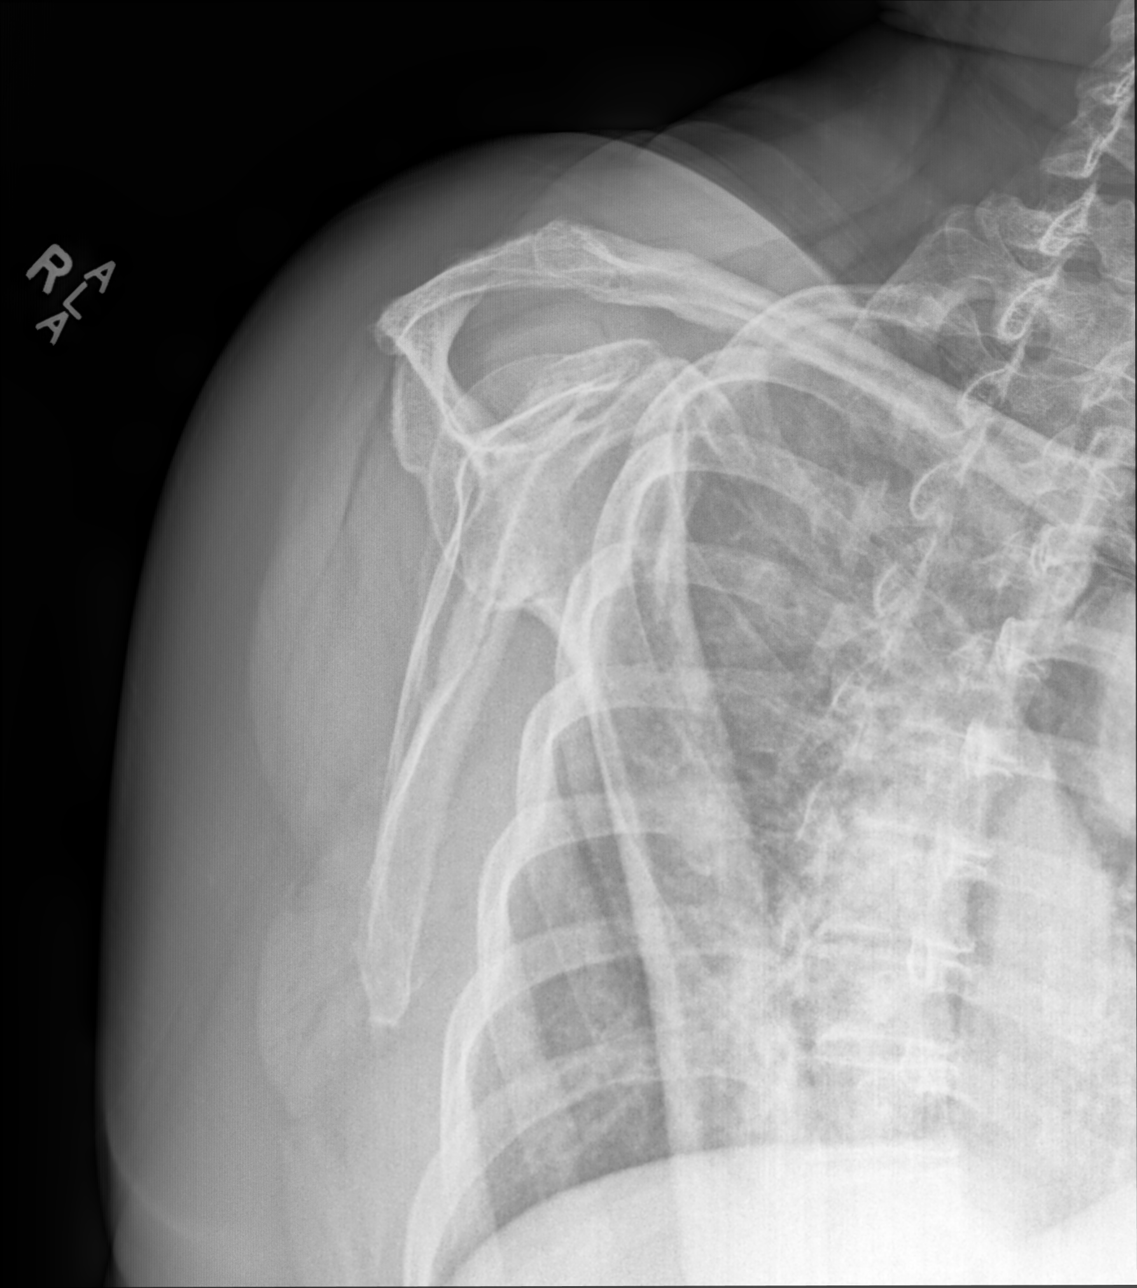

[shoulder (axial)]
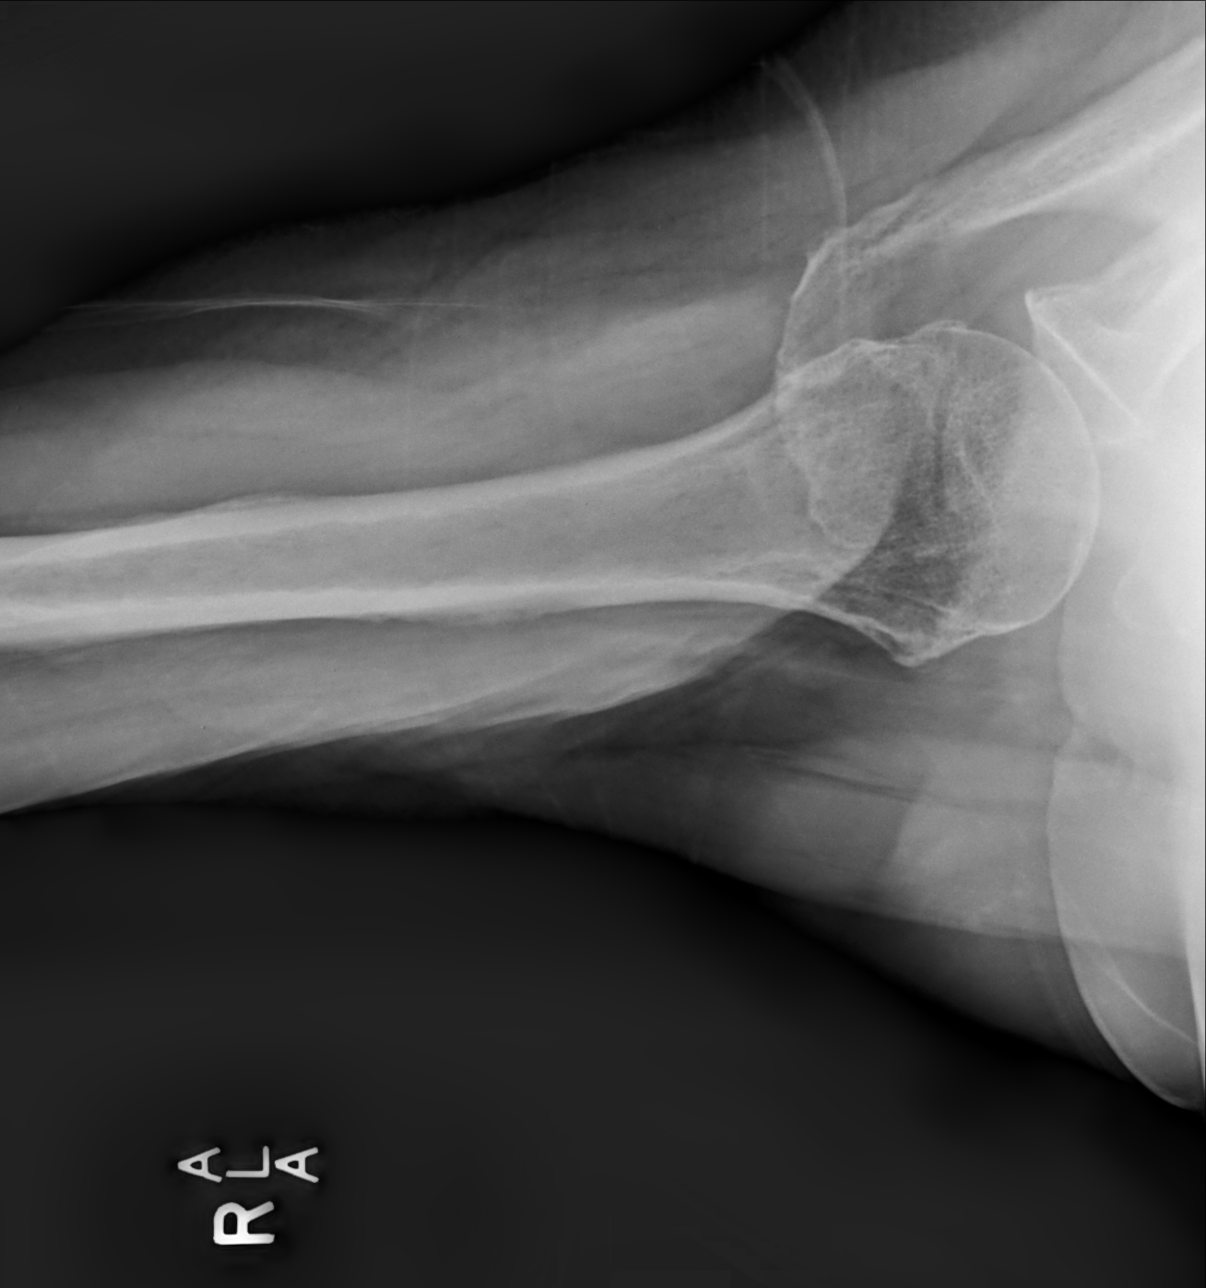

[3 of 3 positions shown; findings below may reference images not displayed]

FINDINGS: No acute fracture or dislocation identified. Mild narrowing of the
glenohumeral joint and acromioclavicular joint with small inferior
marginal osteophytes. No focal bone lesion identified.
IMPRESSION: Mild degenerative changes with no acute osseous abnormality
identified.

## 2023-04-02 DIAGNOSIS — Z471 Aftercare following joint replacement surgery: Secondary | ICD-10-CM | POA: Diagnosis not present

## 2023-04-02 DIAGNOSIS — Z96651 Presence of right artificial knee joint: Secondary | ICD-10-CM | POA: Diagnosis not present

## 2023-04-30 ENCOUNTER — Other Ambulatory Visit: Payer: Self-pay | Admitting: Family Medicine

## 2023-04-30 DIAGNOSIS — J4542 Moderate persistent asthma with status asthmaticus: Secondary | ICD-10-CM

## 2023-05-11 DIAGNOSIS — M1711 Unilateral primary osteoarthritis, right knee: Secondary | ICD-10-CM | POA: Diagnosis not present

## 2023-05-14 ENCOUNTER — Telehealth: Payer: Medicare Other

## 2023-05-14 NOTE — Telephone Encounter (Signed)
Copied from CRM [id number]. Topic: Clinical - Medication Question >> May 14, 2023  3:02 PM Adaysia C wrote: Reason for CRM: Patient has tested positive for COVID and needs to be prescribed medication before her pharmacy closes for the holiday. Please follow up with PT [phone]

## 2023-05-15 ENCOUNTER — Ambulatory Visit: Payer: Self-pay | Admitting: Family Medicine

## 2023-05-15 ENCOUNTER — Telehealth: Payer: Medicare Other | Admitting: Physician Assistant

## 2023-05-15 ENCOUNTER — Encounter: Payer: Self-pay | Admitting: Physician Assistant

## 2023-05-15 VITALS — Ht 66.0 in | Wt 183.0 lb

## 2023-05-15 DIAGNOSIS — Z794 Long term (current) use of insulin: Secondary | ICD-10-CM

## 2023-05-15 DIAGNOSIS — J4540 Moderate persistent asthma, uncomplicated: Secondary | ICD-10-CM | POA: Diagnosis not present

## 2023-05-15 DIAGNOSIS — E1142 Type 2 diabetes mellitus with diabetic polyneuropathy: Secondary | ICD-10-CM | POA: Diagnosis not present

## 2023-05-15 DIAGNOSIS — U071 COVID-19: Secondary | ICD-10-CM | POA: Diagnosis not present

## 2023-05-15 MED ORDER — NIRMATRELVIR/RITONAVIR (PAXLOVID) TABLET (RENAL DOSING): 2.0000 | ORAL_TABLET | Freq: Two times a day (BID) | ORAL | 0 refills | Status: AC

## 2023-05-15 MED ORDER — NIRMATRELVIR/RITONAVIR (PAXLOVID)TABLET: 3.0000 | ORAL_TABLET | Freq: Two times a day (BID) | ORAL | 0 refills | Status: DC

## 2023-05-15 MED ORDER — PREDNISONE 20 MG PO TABS: 20.0000 mg | ORAL_TABLET | Freq: Two times a day (BID) | ORAL | 0 refills | Status: AC

## 2023-05-15 NOTE — Telephone Encounter (Signed)
Chief Complaint: Covid Positive Symptoms: fever, dry cough, ear fullness, headache, sore throat from cough, chills, fatigue, body aches Frequency: 3 days Pertinent Negatives: Patient denies SOB, difficulty urinating, NVD Disposition: [] ED /[] Urgent Care (no appt availability in office) / [] Appointment(In office/virtual)/ []  [facility]/ [] Home Care/ [] Refused Recommended Disposition /[] [facility]/ []  Follow-up with PCP Additional Notes: Pt calls reporting she had three positive at home covid tests. States symptoms began 05/13/23 after a family gathering, where she later found out someone was covid positive. Pt reports fever, chills, body aches, ear fullness, headache, fatigue, dry cough and sore throat. States she has taken tylenol with relief of the fever. Denies SOB, during call O2 saturation 98%. Pt is requesting Paxlovid. Per protocol, pt to be evaluated within 4 hours. Next available appt with any provider 12/26 @1020 . Pt states she received a secure message during call that she is scheduled for a virtual visit at 0900 today. This RN advised pt to go to appt. Care advice reviewed, pt verbalized understanding. Alerting PCP for review.   Copied from CRM [id number]. Topic: Clinical - Medical Advice >> May 15, 2023  8:27 AM Samuel Jester B wrote: Reason for CRM: Pt stated that she is needing medication after testing positive for covid, I connected her with a triage nurse to assist with telling the PT what kind of medication she can take in the mean time. Reason for Disposition  [1] Fever > 101 F (38.3 C) AND [2] age > 60 years  Answer Assessment - Initial Assessment Questions 1. TEMPERATURE: "What is the most recent temperature?"  "How was it measured?"      101F 2. ONSET: "When did the fever start?"      05/13/23 3. CHILLS: "Do you have chills?" If yes: "How bad are they?"  (e.g., none, mild, moderate, severe)   - NONE: no chills   - MILD: feeling cold   - MODERATE: feeling  very cold, some shivering (feels better under a thick blanket)   - SEVERE: feeling extremely cold with shaking chills (general body shaking, rigors; even under a thick blanket)      moderate 4. OTHER SYMPTOMS: "Do you have any other symptoms besides the fever?"  (e.g., abdomen pain, cough, diarrhea, earache, headache, sore throat, urination pain)     Dry cough, runny nose, itchy eyes, headache, ear congestion, sore throat from cough 5. CAUSE: If there are no symptoms, ask: "What do you think is causing the fever?"      Covid positive at home yesterday. 6. CONTACTS: "Does anyone else in the family have an infection?"     Was at a family event where there was someone with covid 7. TREATMENT: "What have you done so far to treat this fever?" (e.g., medications)     Tylenol 8. IMMUNOCOMPROMISE: "Do you have of the following: diabetes, HIV positive, splenectomy, cancer chemotherapy, chronic steroid treatment, transplant patient, etc."     Diabetes  Answer Assessment - Initial Assessment Questions 1. COVID-19 DIAGNOSIS: "How do you know that you have COVID?" (e.g., positive lab test or self-test, diagnosed by doctor or NP/PA, symptoms after exposure).     3 positive home tests 2. COVID-19 EXPOSURE: "Was there any known exposure to COVID before the symptoms began?" CDC Definition of close contact: within 6 feet (2 meters) for a total of 15 minutes or more over a 24-hour period.      Yes, she was at a family event with covid positive person 3. ONSET: "When  did the COVID-19 symptoms start?"      Sunday, 05/13/23 4. WORST SYMPTOM: "What is your worst symptom?" (e.g., cough, fever, shortness of breath, muscle aches)     Fever, dry cough, ear fullness, runny nose, headache 5. COUGH: "Do you have a cough?" If Yes, ask: "How bad is the cough?"       "Unbelievable" 6. FEVER: "Do you have a fever?" If Yes, ask: "What is your temperature, how was it measured, and when did it start?"     99 .83F this  morning 7. RESPIRATORY STATUS: "Describe your breathing?" (e.g., normal; shortness of breath, wheezing, unable to speak)      Wheezing 8. BETTER-SAME-WORSE: "Are you getting better, staying the same or getting worse compared to yesterday?"  If getting worse, ask, "In what way?"     Same 9. OTHER SYMPTOMS: "Do you have any other symptoms?"  (e.g., chills, fatigue, headache, loss of smell or taste, muscle pain, sore throat)     Chills, headache, loss of smell, fatigue, body aches 10. HIGH RISK DISEASE: "Do you have any chronic medical problems?" (e.g., asthma, heart or lung disease, weak immune system, obesity, etc.)       Diabetes 11. VACCINE: "Have you had the COVID-19 vaccine?" If Yes, ask: "Which one, how many shots, when did you get it?"       Yes, 4 injections- states she has reactions to the last one, possibly 2022  13. O2 SATURATION MONITOR:  "Do you use an oxygen saturation monitor (pulse oximeter) at home?" If Yes, ask "What is your reading (oxygen level) today?" "What is your usual oxygen saturation reading?" (e.g., 95%)       During call O2 reading 98% HR73  Protocols used: Fever-A-AH, Coronavirus (COVID-19) Diagnosed or Suspected-A-AH

## 2023-05-15 NOTE — Progress Notes (Signed)
Virtual Visit via Video   I connected with Terri Gray on 05/15/23 at  9:00 AM EST by a video enabled telemedicine application and verified that I am speaking with the correct person using two identifiers. Location patient: Home Location provider: [facility], Office Persons participating in the virtual visit: Terri Gray, Jarold Motto PA-C, Corky Mull, LPN   I discussed the limitations of evaluation and management by telemedicine and the availability of in person appointments. The patient expressed understanding and agreed to proceed.  I acted as a Neurosurgeon for [employer], [employer], LPN   Subjective:   HPI:   COVID-19 Positive Symptom onset: Sunday 05/13/2023. Travel/contacts: exposed at a graduation  Vaccination status: 4 vaccines Testing results: Home test positive yesterday and today.  Patient endorses the following symptoms: chills, fever Sunday night 101, since 99-100, dry hacking cough, sore throat, headache, body aches, some SOB with coughing  First time getting COVID  She does have asthma but reports that she only takes Symbicort as needed  Denies having to use albuterol currently Patient denies the following symptoms: Denies chest pain.  Treatments tried: Inhaler, Tylenol, Nyquil & Dayquil  ROS: See pertinent positives and negatives per HPI.  Patient Active Problem List   Diagnosis Date Noted   Psoriasis 10/13/2021   GERD (gastroesophageal reflux disease) 02/09/2020   Urticaria 02/03/2020   Seasonal and perennial allergic rhinitis 02/03/2020   Allergic conjunctivitis 02/03/2020   Moderate persistent asthma 02/03/2020   Acquired hypothyroidism 12/17/2019   TMJ (temporomandibular joint disorder) 12/17/2019   Seasonal allergies 12/17/2019   Foot pain, left 07/16/2018   Palpitations 07/10/2018   Dyspnea on exertion 07/10/2018   Atypical chest pain 07/10/2018   Dizziness 07/10/2018   Localized osteoarthritis of right knee  02/28/2018   Radiculopathy of cervical spine 02/28/2018   Goiter 11/12/2017   Diabetes (HCC) 05/30/2017    Social History   Tobacco Use   Smoking status: Never   Smokeless tobacco: Never  Substance Use Topics   Alcohol use: No    Comment: last drink was over 30 years ago    Current Outpatient Medications:    albuterol (VENTOLIN HFA) 108 (90 Base) MCG/ACT inhaler, INHALE TWO PUFFS BY MOUTH EVERY 6 HOURS AS NEEDED FOR SHORTNESS OF BREATH OR WHEEZING, Disp: 8.5 g, Rfl: 1   budesonide-formoterol (SYMBICORT) 160-4.5 MCG/ACT inhaler, Inhale 2 puffs into the lungs in the morning and at bedtime., Disp: 1 each, Rfl: 6   cyclobenzaprine (FLEXERIL) 10 MG tablet, TAKE ONE TABLET BY MOUTH THREE TIMES A DAY AS NEEDED FOR MUSCLE SPASMS, Disp: 90 tablet, Rfl: 1   diltiazem (CARDIZEM CD) 120 MG 24 hr capsule, Take 120 mg by mouth daily., Disp: , Rfl:    esomeprazole (NEXIUM) 40 MG capsule, TAKE 1 CAPSULE BY MOUTH 2 TIMES A DAY FOR 28 DAYS THEN TAKE 1 CAPSULE BY MOUTH DAILY AS DIRECTED, Disp: 90 capsule, Rfl: 1   ferrous sulfate 325 (65 FE) MG tablet, Take one tablet by mouth every other day, with a full glass of water., Disp: , Rfl:    fluticasone (FLONASE) 50 MCG/ACT nasal spray, Place 1-2 sprays into both nostrils daily as needed., Disp: 16 g, Rfl: 5   folic acid (FOLVITE) 1 MG tablet, Take 1 mg by mouth daily., Disp: , Rfl:    folic acid (FOLVITE) 1 MG tablet, Take 1 tablet by mouth daily., Disp: , Rfl:    furosemide (LASIX) 20 MG tablet, TAKE 1 TABLET BY MOUTH DAILY, Disp: 30  tablet, Rfl: 3   glucose blood (TRUE METRIX BLOOD GLUCOSE TEST) test strip, 1 each by Other route 4 (four) times daily as needed for other. And lancets 2/day, Disp: 360 each, Rfl: 3   hydrocortisone valerate ointment (WESTCORT) 0.2 %, Apply 1 application topically 2 (two) times daily., Disp: 60 g, Rfl: 1   ibuprofen (ADVIL) 200 MG tablet, Take 200 mg by mouth every 8 (eight) hours as needed., Disp: , Rfl:    LANTUS SOLOSTAR 100  UNIT/ML Solostar Pen, INJECT 85 UNITS SUBCUTANEOUSLY ONCE DAILY IN THE MORNING, Disp: 75 mL, Rfl: 0   levocetirizine (XYZAL) 5 MG tablet, Take 5 mg by mouth daily as needed., Disp: , Rfl:    levothyroxine (SYNTHROID) 75 MCG tablet, Take 1 tablet (75 mcg total) by mouth daily before breakfast., Disp: 90 tablet, Rfl: 3   metFORMIN (GLUCOPHAGE) 1000 MG tablet, Take 1 tablet (1,000 mg total) by mouth 2 (two) times daily with a meal., Disp: 180 tablet, Rfl: 3   methotrexate (RHEUMATREX) 2.5 MG tablet, Take by mouth., Disp: , Rfl:    mometasone (ELOCON) 0.1 % cream, Apply topically., Disp: , Rfl:    nirmatrelvir/ritonavir, renal dosing, (PAXLOVID) 10 x 150 MG & 10 x 100MG  TABS, Take 2 tablets by mouth 2 (two) times daily for 5 days. (Take nirmatrelvir 150 mg one tablet twice daily for 5 days and ritonavir 100 mg one tablet twice daily for 5 days) Please hold statin while taking medication., Disp: 20 tablet, Rfl: 0   pimecrolimus (ELIDEL) 1 % cream, Apply twice a day on affected areas, Disp: , Rfl:    predniSONE (DELTASONE) 20 MG tablet, Take 1 tablet (20 mg total) by mouth 2 (two) times daily with a meal., Disp: 10 tablet, Rfl: 0   simvastatin (ZOCOR) 40 MG tablet, Take 1 tablet (40 mg total) by mouth daily., Disp: 90 tablet, Rfl: 3   montelukast (SINGULAIR) 10 MG tablet, TAKE 1 TABLET BY MOUTH AT BEDTIME (Patient not taking: Reported on 05/15/2023), Disp: 90 tablet, Rfl: 3  Allergies  Allergen Reactions   Gabapentin     Stomach ache    Objective:   VITALS: Per patient if applicable, see vitals. GENERAL: Alert, appears well and in no acute distress. HEENT: Atraumatic, conjunctiva clear, no obvious abnormalities on inspection of external nose and ears. NECK: Normal movements of the head and neck. CARDIOPULMONARY: No increased WOB. Speaking in clear sentences. I:E ratio WNL.  MS: Moves all visible extremities without noticeable abnormality. PSYCH: Pleasant and cooperative, well-groomed. Speech  normal rate and rhythm. Affect is appropriate. Insight and judgement are appropriate. Attention is focused, linear, and appropriate.  NEURO: CN grossly intact. Oriented as arrived to appointment on time with no prompting. Moves both UE equally.  SKIN: No obvious lesions, wounds, erythema, or cyanosis noted on face or hands.  Assessment and Plan:   Terri "Gerlean Ren" was seen today for covid positive.  Diagnoses and all orders for this visit:  COVID-19 No red flags on discussion, patient is not in any obvious distress during our visit. Discussed progression of most viral illnesses, and recommended supportive care at this point in time. Discussed risks/benefits of Paxlovid -- she would like to start this. Last GFR that I can see was 56 so we will send in renal dosage for patient out of abundance of caution. I also advised her to HOLD statin while taking Paxlovid.  Discussed over the counter supportive care options, including Tylenol 500 mg q 8 hours, with recommendations to push  fluids and rest. Reviewed return precautions including new/worsening fever, SOB, new/worsening cough, sudden onset changes of symptoms. Recommended need to self-quarantine and practice social distancing until symptoms resolve. I recommend that patient follow-up if symptoms worsen or persist despite treatment x 7-10 days, sooner if needed.  I discussed the assessment and treatment plan with the patient. The patient was provided an opportunity to ask questions and all were answered. The patient agreed with the plan and demonstrated an understanding of the instructions.   The patient was advised to call back or seek an in-person evaluation if the symptoms worsen or if the condition fails to improve as anticipated.  Type 2 diabetes mellitus with diabetic polyneuropathy, with long-term current use of insulin (HCC) Most recent A1c well controlled Discussed that we are starting prednisone given history of asthma and to monitor  blood sugars -- if blood sugars are >250  persistently, call office for further advice  Moderate persistent asthma, unspecified whether complicated Overall controlled per patient however I will add  prednisone due to current COVID status Encouraged REGULAR use of Symbicort during this illness Close follow-up with PCP if new/worsening symptom(s)    Other orders -     predniSONE (DELTASONE) 20 MG tablet; Take 1 tablet (20 mg total) by mouth 2 (two) times daily with a meal. -     nirmatrelvir/ritonavir, renal dosing, (PAXLOVID) 10 x 150 MG & 10 x 100MG  TABS; Take 2 tablets by mouth 2 (two) times daily for 5 days. (Take nirmatrelvir 150 mg one tablet twice daily for 5 days and ritonavir 100 mg one tablet twice daily for 5 days) Please hold statin while taking medication.    [place], [place] 05/15/2023

## 2023-05-23 HISTORY — PX: KNEE SURGERY: SHX244

## 2023-05-29 DIAGNOSIS — M13861 Other specified arthritis, right knee: Secondary | ICD-10-CM | POA: Diagnosis not present

## 2023-05-29 DIAGNOSIS — Z79631 Long term (current) use of antimetabolite agent: Secondary | ICD-10-CM | POA: Diagnosis not present

## 2023-05-29 DIAGNOSIS — Z7984 Long term (current) use of oral hypoglycemic drugs: Secondary | ICD-10-CM | POA: Diagnosis not present

## 2023-05-29 DIAGNOSIS — K219 Gastro-esophageal reflux disease without esophagitis: Secondary | ICD-10-CM | POA: Diagnosis not present

## 2023-05-29 DIAGNOSIS — E039 Hypothyroidism, unspecified: Secondary | ICD-10-CM | POA: Diagnosis not present

## 2023-05-29 DIAGNOSIS — I10 Essential (primary) hypertension: Secondary | ICD-10-CM | POA: Diagnosis not present

## 2023-05-29 DIAGNOSIS — Z96653 Presence of artificial knee joint, bilateral: Secondary | ICD-10-CM | POA: Diagnosis not present

## 2023-05-29 DIAGNOSIS — Z794 Long term (current) use of insulin: Secondary | ICD-10-CM | POA: Diagnosis not present

## 2023-05-29 DIAGNOSIS — Z7985 Long-term (current) use of injectable non-insulin antidiabetic drugs: Secondary | ICD-10-CM | POA: Diagnosis not present

## 2023-05-29 DIAGNOSIS — G43009 Migraine without aura, not intractable, without status migrainosus: Secondary | ICD-10-CM | POA: Diagnosis not present

## 2023-05-29 DIAGNOSIS — G8918 Other acute postprocedural pain: Secondary | ICD-10-CM | POA: Diagnosis not present

## 2023-05-29 DIAGNOSIS — M1711 Unilateral primary osteoarthritis, right knee: Secondary | ICD-10-CM | POA: Diagnosis not present

## 2023-05-29 DIAGNOSIS — Z79899 Other long term (current) drug therapy: Secondary | ICD-10-CM | POA: Diagnosis not present

## 2023-05-29 DIAGNOSIS — E1142 Type 2 diabetes mellitus with diabetic polyneuropathy: Secondary | ICD-10-CM | POA: Diagnosis not present

## 2023-05-29 DIAGNOSIS — Z888 Allergy status to other drugs, medicaments and biological substances status: Secondary | ICD-10-CM | POA: Diagnosis not present

## 2023-06-03 ENCOUNTER — Other Ambulatory Visit: Payer: Self-pay | Admitting: Family Medicine

## 2023-06-03 DIAGNOSIS — I10 Essential (primary) hypertension: Secondary | ICD-10-CM

## 2023-06-25 DIAGNOSIS — R29898 Other symptoms and signs involving the musculoskeletal system: Secondary | ICD-10-CM | POA: Diagnosis not present

## 2023-06-25 DIAGNOSIS — Z7409 Other reduced mobility: Secondary | ICD-10-CM | POA: Diagnosis not present

## 2023-06-25 DIAGNOSIS — Z9889 Other specified postprocedural states: Secondary | ICD-10-CM | POA: Diagnosis not present

## 2023-06-25 DIAGNOSIS — M25561 Pain in right knee: Secondary | ICD-10-CM | POA: Diagnosis not present

## 2023-06-27 DIAGNOSIS — Z79899 Other long term (current) drug therapy: Secondary | ICD-10-CM | POA: Diagnosis not present

## 2023-06-27 DIAGNOSIS — L280 Lichen simplex chronicus: Secondary | ICD-10-CM | POA: Diagnosis not present

## 2023-06-27 DIAGNOSIS — L509 Urticaria, unspecified: Secondary | ICD-10-CM | POA: Diagnosis not present

## 2023-07-02 ENCOUNTER — Other Ambulatory Visit: Payer: Self-pay | Admitting: Family Medicine

## 2023-07-02 DIAGNOSIS — M26609 Unspecified temporomandibular joint disorder, unspecified side: Secondary | ICD-10-CM

## 2023-07-02 DIAGNOSIS — K219 Gastro-esophageal reflux disease without esophagitis: Secondary | ICD-10-CM

## 2023-07-03 DIAGNOSIS — M25561 Pain in right knee: Secondary | ICD-10-CM | POA: Diagnosis not present

## 2023-07-03 DIAGNOSIS — Z9889 Other specified postprocedural states: Secondary | ICD-10-CM | POA: Diagnosis not present

## 2023-07-03 DIAGNOSIS — Z7409 Other reduced mobility: Secondary | ICD-10-CM | POA: Diagnosis not present

## 2023-07-03 DIAGNOSIS — R29898 Other symptoms and signs involving the musculoskeletal system: Secondary | ICD-10-CM | POA: Diagnosis not present

## 2023-07-09 DIAGNOSIS — Z96651 Presence of right artificial knee joint: Secondary | ICD-10-CM | POA: Diagnosis not present

## 2023-07-09 DIAGNOSIS — Z471 Aftercare following joint replacement surgery: Secondary | ICD-10-CM | POA: Diagnosis not present

## 2023-07-11 DIAGNOSIS — Z7409 Other reduced mobility: Secondary | ICD-10-CM | POA: Diagnosis not present

## 2023-07-11 DIAGNOSIS — R29898 Other symptoms and signs involving the musculoskeletal system: Secondary | ICD-10-CM | POA: Diagnosis not present

## 2023-07-11 DIAGNOSIS — M25561 Pain in right knee: Secondary | ICD-10-CM | POA: Diagnosis not present

## 2023-07-11 DIAGNOSIS — Z9889 Other specified postprocedural states: Secondary | ICD-10-CM | POA: Diagnosis not present

## 2023-07-12 DIAGNOSIS — L4059 Other psoriatic arthropathy: Secondary | ICD-10-CM | POA: Diagnosis not present

## 2023-07-12 DIAGNOSIS — Z79899 Other long term (current) drug therapy: Secondary | ICD-10-CM | POA: Diagnosis not present

## 2023-07-12 DIAGNOSIS — E039 Hypothyroidism, unspecified: Secondary | ICD-10-CM | POA: Diagnosis not present

## 2023-07-16 DIAGNOSIS — Z7409 Other reduced mobility: Secondary | ICD-10-CM | POA: Diagnosis not present

## 2023-07-16 DIAGNOSIS — Z9889 Other specified postprocedural states: Secondary | ICD-10-CM | POA: Diagnosis not present

## 2023-07-16 DIAGNOSIS — M25561 Pain in right knee: Secondary | ICD-10-CM | POA: Diagnosis not present

## 2023-07-16 DIAGNOSIS — R29898 Other symptoms and signs involving the musculoskeletal system: Secondary | ICD-10-CM | POA: Diagnosis not present

## 2023-07-23 DIAGNOSIS — M1812 Unilateral primary osteoarthritis of first carpometacarpal joint, left hand: Secondary | ICD-10-CM | POA: Diagnosis not present

## 2023-07-23 DIAGNOSIS — M25532 Pain in left wrist: Secondary | ICD-10-CM | POA: Diagnosis not present

## 2023-07-23 DIAGNOSIS — M25521 Pain in right elbow: Secondary | ICD-10-CM | POA: Diagnosis not present

## 2023-07-23 DIAGNOSIS — Z9889 Other specified postprocedural states: Secondary | ICD-10-CM | POA: Diagnosis not present

## 2023-07-23 DIAGNOSIS — M25512 Pain in left shoulder: Secondary | ICD-10-CM | POA: Diagnosis not present

## 2023-07-23 DIAGNOSIS — M25511 Pain in right shoulder: Secondary | ICD-10-CM | POA: Diagnosis not present

## 2023-07-23 DIAGNOSIS — M25522 Pain in left elbow: Secondary | ICD-10-CM | POA: Diagnosis not present

## 2023-07-23 DIAGNOSIS — M19011 Primary osteoarthritis, right shoulder: Secondary | ICD-10-CM | POA: Diagnosis not present

## 2023-07-23 DIAGNOSIS — M1811 Unilateral primary osteoarthritis of first carpometacarpal joint, right hand: Secondary | ICD-10-CM | POA: Diagnosis not present

## 2023-07-23 DIAGNOSIS — M19012 Primary osteoarthritis, left shoulder: Secondary | ICD-10-CM | POA: Diagnosis not present

## 2023-07-23 DIAGNOSIS — M25531 Pain in right wrist: Secondary | ICD-10-CM | POA: Diagnosis not present

## 2023-07-23 DIAGNOSIS — R29898 Other symptoms and signs involving the musculoskeletal system: Secondary | ICD-10-CM | POA: Diagnosis not present

## 2023-07-23 DIAGNOSIS — M25561 Pain in right knee: Secondary | ICD-10-CM | POA: Diagnosis not present

## 2023-07-23 DIAGNOSIS — Z7409 Other reduced mobility: Secondary | ICD-10-CM | POA: Diagnosis not present

## 2023-08-08 DIAGNOSIS — Z7409 Other reduced mobility: Secondary | ICD-10-CM | POA: Diagnosis not present

## 2023-08-08 DIAGNOSIS — R29898 Other symptoms and signs involving the musculoskeletal system: Secondary | ICD-10-CM | POA: Diagnosis not present

## 2023-08-08 DIAGNOSIS — Z9889 Other specified postprocedural states: Secondary | ICD-10-CM | POA: Diagnosis not present

## 2023-08-08 DIAGNOSIS — M25561 Pain in right knee: Secondary | ICD-10-CM | POA: Diagnosis not present

## 2023-08-15 DIAGNOSIS — R29898 Other symptoms and signs involving the musculoskeletal system: Secondary | ICD-10-CM | POA: Diagnosis not present

## 2023-08-15 DIAGNOSIS — Z7409 Other reduced mobility: Secondary | ICD-10-CM | POA: Diagnosis not present

## 2023-08-15 DIAGNOSIS — M25561 Pain in right knee: Secondary | ICD-10-CM | POA: Diagnosis not present

## 2023-08-15 DIAGNOSIS — Z9889 Other specified postprocedural states: Secondary | ICD-10-CM | POA: Diagnosis not present

## 2023-08-20 DIAGNOSIS — M25512 Pain in left shoulder: Secondary | ICD-10-CM | POA: Diagnosis not present

## 2023-08-20 DIAGNOSIS — M25511 Pain in right shoulder: Secondary | ICD-10-CM | POA: Diagnosis not present

## 2023-08-20 DIAGNOSIS — G5621 Lesion of ulnar nerve, right upper limb: Secondary | ICD-10-CM | POA: Diagnosis not present

## 2023-08-22 DIAGNOSIS — Z7409 Other reduced mobility: Secondary | ICD-10-CM | POA: Diagnosis not present

## 2023-08-22 DIAGNOSIS — M25561 Pain in right knee: Secondary | ICD-10-CM | POA: Diagnosis not present

## 2023-08-22 DIAGNOSIS — R29898 Other symptoms and signs involving the musculoskeletal system: Secondary | ICD-10-CM | POA: Diagnosis not present

## 2023-08-22 DIAGNOSIS — Z9889 Other specified postprocedural states: Secondary | ICD-10-CM | POA: Diagnosis not present

## 2023-08-28 DIAGNOSIS — M25561 Pain in right knee: Secondary | ICD-10-CM | POA: Diagnosis not present

## 2023-08-28 DIAGNOSIS — Z7409 Other reduced mobility: Secondary | ICD-10-CM | POA: Diagnosis not present

## 2023-08-28 DIAGNOSIS — R29898 Other symptoms and signs involving the musculoskeletal system: Secondary | ICD-10-CM | POA: Diagnosis not present

## 2023-08-28 DIAGNOSIS — Z9889 Other specified postprocedural states: Secondary | ICD-10-CM | POA: Diagnosis not present

## 2023-09-05 DIAGNOSIS — R29898 Other symptoms and signs involving the musculoskeletal system: Secondary | ICD-10-CM | POA: Diagnosis not present

## 2023-09-05 DIAGNOSIS — Z7409 Other reduced mobility: Secondary | ICD-10-CM | POA: Diagnosis not present

## 2023-09-05 DIAGNOSIS — M25561 Pain in right knee: Secondary | ICD-10-CM | POA: Diagnosis not present

## 2023-09-05 DIAGNOSIS — Z9889 Other specified postprocedural states: Secondary | ICD-10-CM | POA: Diagnosis not present

## 2023-09-26 DIAGNOSIS — E039 Hypothyroidism, unspecified: Secondary | ICD-10-CM | POA: Diagnosis not present

## 2023-09-26 DIAGNOSIS — E785 Hyperlipidemia, unspecified: Secondary | ICD-10-CM | POA: Diagnosis not present

## 2023-09-26 DIAGNOSIS — Z794 Long term (current) use of insulin: Secondary | ICD-10-CM | POA: Diagnosis not present

## 2023-09-26 DIAGNOSIS — Z7984 Long term (current) use of oral hypoglycemic drugs: Secondary | ICD-10-CM | POA: Diagnosis not present

## 2023-09-26 DIAGNOSIS — E119 Type 2 diabetes mellitus without complications: Secondary | ICD-10-CM | POA: Diagnosis not present

## 2023-09-26 DIAGNOSIS — I10 Essential (primary) hypertension: Secondary | ICD-10-CM | POA: Diagnosis not present

## 2023-10-12 ENCOUNTER — Other Ambulatory Visit: Payer: Self-pay | Admitting: Family Medicine

## 2023-10-12 DIAGNOSIS — I10 Essential (primary) hypertension: Secondary | ICD-10-CM

## 2023-10-22 ENCOUNTER — Other Ambulatory Visit: Payer: Self-pay | Admitting: Family Medicine

## 2023-10-22 ENCOUNTER — Ambulatory Visit (INDEPENDENT_AMBULATORY_CARE_PROVIDER_SITE_OTHER): Admitting: Family Medicine

## 2023-10-22 ENCOUNTER — Encounter: Payer: Self-pay | Admitting: Family Medicine

## 2023-10-22 ENCOUNTER — Ambulatory Visit: Payer: Self-pay | Admitting: Family Medicine

## 2023-10-22 ENCOUNTER — Ambulatory Visit (HOSPITAL_BASED_OUTPATIENT_CLINIC_OR_DEPARTMENT_OTHER)
Admission: RE | Admit: 2023-10-22 | Discharge: 2023-10-22 | Disposition: A | Discharge status: Home / Self Care | Source: Ambulatory Visit | Attending: Family Medicine | Admitting: Family Medicine

## 2023-10-22 VITALS — BP 144/90 | HR 87 | Temp 98.8°F | Ht 66.0 in | Wt 198.0 lb

## 2023-10-22 DIAGNOSIS — E039 Hypothyroidism, unspecified: Secondary | ICD-10-CM

## 2023-10-22 DIAGNOSIS — E1142 Type 2 diabetes mellitus with diabetic polyneuropathy: Secondary | ICD-10-CM | POA: Diagnosis not present

## 2023-10-22 DIAGNOSIS — R300 Dysuria: Secondary | ICD-10-CM | POA: Diagnosis not present

## 2023-10-22 DIAGNOSIS — R350 Frequency of micturition: Secondary | ICD-10-CM

## 2023-10-22 DIAGNOSIS — N6452 Nipple discharge: Secondary | ICD-10-CM | POA: Diagnosis not present

## 2023-10-22 DIAGNOSIS — N23 Unspecified renal colic: Secondary | ICD-10-CM | POA: Insufficient documentation

## 2023-10-22 DIAGNOSIS — R601 Generalized edema: Secondary | ICD-10-CM

## 2023-10-22 DIAGNOSIS — Z794 Long term (current) use of insulin: Secondary | ICD-10-CM | POA: Diagnosis not present

## 2023-10-22 DIAGNOSIS — R1011 Right upper quadrant pain: Secondary | ICD-10-CM | POA: Insufficient documentation

## 2023-10-22 DIAGNOSIS — K219 Gastro-esophageal reflux disease without esophagitis: Secondary | ICD-10-CM

## 2023-10-22 DIAGNOSIS — M26609 Unspecified temporomandibular joint disorder, unspecified side: Secondary | ICD-10-CM

## 2023-10-22 DIAGNOSIS — R109 Unspecified abdominal pain: Secondary | ICD-10-CM | POA: Diagnosis not present

## 2023-10-22 LAB — COMPREHENSIVE METABOLIC PANEL WITH GFR
ALT: 21 U/L (ref 0–35)
AST: 24 U/L (ref 0–37)
Albumin: 4.2 g/dL (ref 3.5–5.2)
Alkaline Phosphatase: 80 U/L (ref 39–117)
BUN: 7 mg/dL (ref 6–23)
CO2: 30 meq/L (ref 19–32)
Calcium: 9.2 mg/dL (ref 8.4–10.5)
Chloride: 101 meq/L (ref 96–112)
Creatinine, Ser: 0.83 mg/dL (ref 0.40–1.20)
GFR: 72.33 mL/min (ref >60.00)
Glucose, Bld: 82 mg/dL (ref 70–99)
Potassium: 3.6 meq/L (ref 3.5–5.1)
Sodium: 139 meq/L (ref 135–145)
Total Bilirubin: 0.4 mg/dL (ref 0.2–1.2)
Total Protein: 7.4 g/dL (ref 6.0–8.3)

## 2023-10-22 LAB — MICROALBUMIN / CREATININE URINE RATIO
Creatinine,U: 51 mg/dL
Microalb Creat Ratio: UNDETERMINED mg/g (ref 0.0–30.0)
Microalb, Ur: <0.7 mg/dL

## 2023-10-22 LAB — URINALYSIS, ROUTINE W REFLEX MICROSCOPIC
Bilirubin Urine: NEGATIVE
Hgb urine dipstick: NEGATIVE
Ketones, ur: NEGATIVE
Leukocytes,Ua: NEGATIVE
Nitrite: NEGATIVE
RBC / HPF: NONE SEEN (ref 0–?)
Specific Gravity, Urine: 1.01 (ref 1.000–1.030)
Total Protein, Urine: NEGATIVE
Urine Glucose: NEGATIVE
Urobilinogen, UA: 0.2 (ref 0.0–1.0)
WBC, UA: NONE SEEN (ref 0–?)
pH: 6 (ref 5.0–8.0)

## 2023-10-22 LAB — POC URINALSYSI DIPSTICK (AUTOMATED)
Bilirubin, UA: NEGATIVE
Blood, UA: NEGATIVE
Glucose, UA: NEGATIVE
Ketones, UA: NEGATIVE
Leukocytes, UA: NEGATIVE
Nitrite, UA: NEGATIVE
Protein, UA: NEGATIVE
Spec Grav, UA: 1.01 (ref 1.010–1.025)
Urobilinogen, UA: 0.2 U/dL
pH, UA: 6 (ref 5.0–8.0)

## 2023-10-22 LAB — CBC WITH DIFFERENTIAL/PLATELET
Basophils Absolute: 0 10*3/uL (ref 0.0–0.1)
Basophils Relative: 0.5 % (ref 0.0–3.0)
Eosinophils Absolute: 0.3 10*3/uL (ref 0.0–0.7)
Eosinophils Relative: 5.3 % — ABNORMAL HIGH (ref 0.0–5.0)
HCT: 36.3 % (ref 36.0–46.0)
Hemoglobin: 11.9 g/dL — ABNORMAL LOW (ref 12.0–15.0)
Lymphocytes Relative: 31.5 % (ref 12.0–46.0)
Lymphs Abs: 1.9 10*3/uL (ref 0.7–4.0)
MCHC: 32.7 g/dL (ref 30.0–36.0)
MCV: 84.6 fl (ref 78.0–100.0)
Monocytes Absolute: 0.5 10*3/uL (ref 0.1–1.0)
Monocytes Relative: 8.8 % (ref 3.0–12.0)
Neutro Abs: 3.3 10*3/uL (ref 1.4–7.7)
Neutrophils Relative %: 53.9 % (ref 43.0–77.0)
Platelets: 363 10*3/uL (ref 150.0–400.0)
RBC: 4.3 Mil/uL (ref 3.87–5.11)
RDW: 16.5 % — ABNORMAL HIGH (ref 11.5–15.5)
WBC: 6.2 10*3/uL (ref 4.0–10.5)

## 2023-10-22 LAB — HEMOGLOBIN A1C: Hgb A1c MFr Bld: 6.1 % (ref 4.6–6.5)

## 2023-10-22 LAB — TSH: TSH: 0.23 u[IU]/mL — ABNORMAL LOW (ref 0.35–5.50)

## 2023-10-22 MED ORDER — ONDANSETRON HCL 4 MG PO TABS: 4.0000 mg | ORAL_TABLET | Freq: Three times a day (TID) | ORAL | 0 refills | Status: AC | PRN

## 2023-10-22 MED ORDER — TAMSULOSIN HCL 0.4 MG PO CAPS: 0.4000 mg | ORAL_CAPSULE | Freq: Every day | ORAL | 0 refills | Status: DC

## 2023-10-22 NOTE — Addendum Note (Signed)
 Addended by: Georga Killings A on: 10/22/2023 03:27 PM   Modules accepted: Orders

## 2023-10-22 NOTE — Progress Notes (Signed)
 Established Patient Office Visit   Subjective  Patient ID: Terri Gray, female    DOB: [birth year]  Age: 68 y.o. MRN: [id number]  Chief Complaint  Patient presents with   Medical Management of Chronic Issues    Right side lower back pain, started a month ago, patient is having swelling in the fa ce and feet, dark urine, and frequency, rate of pain 9 out of 10    Patient is a 68 year old female seen for ongoing concern of severe abdominal and back pain, and urinary symptoms.  She has been experiencing severe abdominal and back pain for over six weeks. The pain began as mild discomfort, initially thought to be a pulled muscle, but has intensified to an unbearable level. It is described as a 'pulling or tearing' sensation, located internally, and not tender to touch externally. The pain radiates from the back to the front and worsens with movement, making it difficult to lie down or get up.  She reports urinary symptoms including a frequent urge to urinate, difficulty initiating urination, a weak stream, and dark urine. However, her urine was clear on the day of the visit. She consumes a significant amount of water daily, approximately 180-240 ounces, and occasionally drinks coffee and soda. She has no history of kidney stones.  She has experienced swelling in her feet, fingers, and face, along with a weight gain of five pounds in one week without dietary changes. Her abdomen feels hard and bloated. She experiences intermittent constipation and has not noticed any blood in her stool or stones in her urine. She feels nauseated at times, for which she has taken Zofran  with relief.  She has a history of widespread joint pain and inflammation followed by rheumatology. Takes methotrexate 3 times per week.  She has not had any recent changes in her medication regimen. She also feels feverish with a low-grade fever of 74F, and experiences pain that comes and goes, worsening with movement.  She  has noticed clear, odorless liquid discharge from R nipple on two occasions. Initially thought was water. There is no associated pain, blood or stickiness with the discharge.  Patient request any imaging/referrals be done in [place], [place].   Patient Active Problem List   Diagnosis Date Noted   Psoriasis 10/13/2021   GERD (gastroesophageal reflux disease) 02/09/2020   Urticaria 02/03/2020   Seasonal and perennial allergic rhinitis 02/03/2020   Allergic conjunctivitis 02/03/2020   Moderate persistent asthma 02/03/2020   Acquired hypothyroidism 12/17/2019   TMJ (temporomandibular joint disorder) 12/17/2019   Seasonal allergies 12/17/2019   Foot pain, left 07/16/2018   Palpitations 07/10/2018   Dyspnea on exertion 07/10/2018   Atypical chest pain 07/10/2018   Dizziness 07/10/2018   Localized osteoarthritis of right knee 02/28/2018   Radiculopathy of cervical spine 02/28/2018   Goiter 11/12/2017   Diabetes (HCC) 05/30/2017   Past Medical History:  Diagnosis Date   Allergy     Arthritis    Asthma    Chicken pox    Colon polyps    Diabetes mellitus without complication (HCC)    GERD (gastroesophageal reflux disease)    H/O fracture of nose    Broken by ENT   History of colon polyps    Hyperlipidemia    Hypertension    IBS (irritable bowel syndrome)    Migraines    Obesity    Shingles 2012   Sleep apnea    on CPAP machine but uses it 90% of the time because it gives her  some complication and does a mouth guard as well   Thyroid  disease    Past Surgical History:  Procedure Laterality Date   COLONOSCOPY     Around 2014- 2015 in [place]    ESOPHAGOGASTRODUODENOSCOPY     Around 2014-2015 [place]    REPLACEMENT TOTAL KNEE Left 2016   Social History   Tobacco Use   Smoking status: Never   Smokeless tobacco: Never  Vaping Use   Vaping status: Never Used  Substance Use Topics   Alcohol use: No    Comment: last drink was over 30 years ago   Drug use:  No   Family History  Problem Relation Age of Onset   Bronchitis Mother    Ovarian cancer Mother 7   GER disease Mother    Colon polyps Mother    Irritable bowel syndrome Mother    Diabetes Father    Kidney disease Father    Heart failure Father    Irritable bowel syndrome Father    Diabetes Sister    Asthma Sister    Urticaria Sister    Diabetes Sister    Allergic rhinitis Daughter    GER disease Daughter    Irritable bowel syndrome Daughter    Irritable bowel syndrome Daughter    Diabetes Brother    Allergic rhinitis Son    Allergic rhinitis Grandchild    Food Allergy  Grandchild    Immunodeficiency Neg Hx    Angioedema Neg Hx    Breast cancer Neg Hx    Allergies  Allergen Reactions   Gabapentin     Stomach ache    ROS Negative unless stated above    Objective:      BP (!) 144/90 (BP Location: Left Arm, Patient Position: Sitting, Cuff Size: Normal)   Pulse 87   Temp 98.8 F (37.1 C) (Oral)   Ht 5\' 6"  (1.676 m)   Wt 198 lb (89.8 kg)   LMP  (LMP Unknown)   SpO2 97%   BMI 31.96 kg/m  BP Readings from Last 3 Encounters:  10/22/23 (!) 144/90  09/04/22 (!) 148/96  08/23/22 (!) 155/90   Wt Readings from Last 3 Encounters:  10/22/23 198 lb (89.8 kg)  05/15/23 183 lb (83 kg)  09/04/22 196 lb 6.4 oz (89.1 kg)      Physical Exam Constitutional:      General: She is not in acute distress.    Appearance: Normal appearance.  HENT:     Head: Normocephalic and atraumatic.     Nose: Nose normal.     Mouth/Throat:     Mouth: Mucous membranes are moist.  Cardiovascular:     Rate and Rhythm: Normal rate and regular rhythm.     Heart sounds: Normal heart sounds. No murmur heard.    No gallop.  Pulmonary:     Effort: Pulmonary effort is normal. No respiratory distress.     Breath sounds: Normal breath sounds. No wheezing, rhonchi or rales.  Abdominal:     General: Bowel sounds are normal.     Palpations: Abdomen is soft.     Tenderness: There is  abdominal tenderness in the right upper quadrant. There is no right CVA tenderness, left CVA tenderness or guarding.  Musculoskeletal:     Right lower leg: No edema.     Left lower leg: No edema.  Skin:    General: Skin is warm and dry.  Neurological:     Mental Status: She is alert and oriented to person, place,  and time.        09/04/2022    1:31 PM 08/21/2022    3:38 PM 04/05/2022    1:47 PM  Depression screen PHQ 2/9  Decreased Interest 0 0 0  Down, Depressed, Hopeless 0 0 0  PHQ - 2 Score 0 0 0  Altered sleeping 1    Tired, decreased energy 1    Change in appetite 0    Feeling bad or failure about yourself  0    Trouble concentrating 0    Moving slowly or fidgety/restless 0    Suicidal thoughts 0    PHQ-9 Score 2    Difficult doing work/chores Not difficult at all        09/04/2022    1:31 PM  GAD 7 : Generalized Anxiety Score  Nervous, Anxious, on Edge 0  Control/stop worrying 0  Worry too much - different things 0  Trouble relaxing 0  Restless 0  Easily annoyed or irritable 0  Afraid - awful might happen 0  Total GAD 7 Score 0     Results for orders placed or performed in visit on 10/22/23  POCT Urinalysis Dipstick (Automated)  Result Value Ref Range   Color, UA yellow    Clarity, UA clear    Glucose, UA Negative Negative   Bilirubin, UA neg    Ketones, UA neg    Spec Grav, UA 1.010 1.010 - 1.025   Blood, UA neg    pH, UA 6.0 5.0 - 8.0   Protein, UA Negative Negative   Urobilinogen, UA 0.2 0.2 or 1.0 E.U./dL   Nitrite, UA neg    Leukocytes, UA Negative Negative      Assessment & Plan:   Dysuria -     POCT Urinalysis Dipstick (Automated) -     Comprehensive metabolic panel with GFR; Future -     Urinalysis, Routine w reflex microscopic; Future -     CBC with Differential/Platelet; Future -     CT RENAL STONE STUDY; Future  Type 2 diabetes mellitus with diabetic polyneuropathy, with long-term current use of insulin  (HCC) -     Microalbumin /  creatinine urine ratio -     Hemoglobin A1c; Future  Right upper quadrant abdominal pain -     Comprehensive metabolic panel with GFR; Future -     CBC with Differential/Platelet; Future -     CT RENAL STONE STUDY; Future  Renal colic -     Comprehensive metabolic panel with GFR; Future -     TSH; Future -     CBC with Differential/Platelet; Future -     CT RENAL STONE STUDY; Future -     Ondansetron  HCl; Take 1 tablet (4 mg total) by mouth every 8 (eight) hours as needed for nausea or vomiting.  Dispense: 20 tablet; Refill: 0 -     Tamsulosin HCl; Take 1 capsule (0.4 mg total) by mouth daily.  Dispense: 15 capsule; Refill: 0  Acquired hypothyroidism -     TSH; Future  Urinary frequency -     Urinalysis, Routine w reflex microscopic; Future  Generalized edema -     Comprehensive metabolic panel with GFR; Future -     TSH; Future  Nipple discharge -     MM 3D DIAGNOSTIC MAMMOGRAM BILATERAL BREAST; Future   Pt with ongoing urinary symptoms and back pain concerning for renal calculi.  Euvolemic on exam.  POC UA largely normal.  Obtain microscopic analysis.  Given Zofran   for nausea and Flomax.  Patient encouraged to increase p.o. intake of fluids.  Will obtain labs to evaluate other possible causes including kidney injury which may be contributing to edema..  Referral to urology if needed based on results.  Diagnostic mammogram ordered for nipple drainage.  Continue Synthroid  75 mcg daily.  Recheck TSH.  Return if symptoms worsen or fail to improve.   Viola Greulich, MD

## 2023-10-22 NOTE — Addendum Note (Signed)
 Addended by: Georga Killings A on: 10/22/2023 03:38 PM   Modules accepted: Orders

## 2023-10-22 NOTE — Patient Instructions (Addendum)
 A referral for CT stone study was placed.  It was requested that it be done in [facility].  They will call you about setting this up.  A referral for diagnostic mammogram was also placed.  They will contact you about scheduling this.  Orders for labs were placed.

## 2023-10-23 LAB — PROLACTIN: Prolactin: 10.3 ng/mL

## 2023-10-24 ENCOUNTER — Other Ambulatory Visit: Payer: Self-pay | Admitting: Family Medicine

## 2023-10-24 DIAGNOSIS — N6452 Nipple discharge: Secondary | ICD-10-CM

## 2023-10-26 ENCOUNTER — Other Ambulatory Visit: Payer: Self-pay | Admitting: Family Medicine

## 2023-10-26 DIAGNOSIS — E039 Hypothyroidism, unspecified: Secondary | ICD-10-CM

## 2023-10-26 MED ORDER — LEVOTHYROXINE SODIUM 50 MCG PO TABS: 50.0000 ug | ORAL_TABLET | Freq: Every day | ORAL | 3 refills | Status: AC

## 2023-10-31 ENCOUNTER — Encounter: Payer: Self-pay | Admitting: Gastroenterology

## 2023-10-31 ENCOUNTER — Encounter: Payer: Self-pay | Admitting: Family Medicine

## 2023-10-31 ENCOUNTER — Ambulatory Visit (INDEPENDENT_AMBULATORY_CARE_PROVIDER_SITE_OTHER): Admitting: Family Medicine

## 2023-10-31 VITALS — BP 142/86 | HR 81 | Temp 99.0°F | Ht 66.0 in | Wt 196.2 lb

## 2023-10-31 DIAGNOSIS — I10 Essential (primary) hypertension: Secondary | ICD-10-CM | POA: Diagnosis not present

## 2023-10-31 DIAGNOSIS — Z794 Long term (current) use of insulin: Secondary | ICD-10-CM

## 2023-10-31 DIAGNOSIS — R112 Nausea with vomiting, unspecified: Secondary | ICD-10-CM | POA: Diagnosis not present

## 2023-10-31 DIAGNOSIS — E1142 Type 2 diabetes mellitus with diabetic polyneuropathy: Secondary | ICD-10-CM | POA: Diagnosis not present

## 2023-10-31 DIAGNOSIS — R1084 Generalized abdominal pain: Secondary | ICD-10-CM

## 2023-10-31 DIAGNOSIS — E039 Hypothyroidism, unspecified: Secondary | ICD-10-CM

## 2023-10-31 LAB — COMPREHENSIVE METABOLIC PANEL WITH GFR
ALT: 14 U/L (ref 0–35)
AST: 18 U/L (ref 0–37)
Albumin: 4.1 g/dL (ref 3.5–5.2)
Alkaline Phosphatase: 83 U/L (ref 39–117)
BUN: 4 mg/dL — ABNORMAL LOW (ref 6–23)
CO2: 28 meq/L (ref 19–32)
Calcium: 9.2 mg/dL (ref 8.4–10.5)
Chloride: 101 meq/L (ref 96–112)
Creatinine, Ser: 0.8 mg/dL (ref 0.40–1.20)
GFR: 75.58 mL/min (ref >60.00)
Glucose, Bld: 96 mg/dL (ref 70–99)
Potassium: 4.1 meq/L (ref 3.5–5.1)
Sodium: 138 meq/L (ref 135–145)
Total Bilirubin: 0.4 mg/dL (ref 0.2–1.2)
Total Protein: 6.9 g/dL (ref 6.0–8.3)

## 2023-10-31 LAB — CBC WITH DIFFERENTIAL/PLATELET
Basophils Absolute: 0 10*3/uL (ref 0.0–0.1)
Basophils Relative: 0.7 % (ref 0.0–3.0)
Eosinophils Absolute: 0.5 10*3/uL (ref 0.0–0.7)
Eosinophils Relative: 6.9 % — ABNORMAL HIGH (ref 0.0–5.0)
HCT: 35.8 % — ABNORMAL LOW (ref 36.0–46.0)
Hemoglobin: 11.8 g/dL — ABNORMAL LOW (ref 12.0–15.0)
Lymphocytes Relative: 30.8 % (ref 12.0–46.0)
Lymphs Abs: 2.2 10*3/uL (ref 0.7–4.0)
MCHC: 32.8 g/dL (ref 30.0–36.0)
MCV: 83.6 fl (ref 78.0–100.0)
Monocytes Absolute: 0.6 10*3/uL (ref 0.1–1.0)
Monocytes Relative: 8.7 % (ref 3.0–12.0)
Neutro Abs: 3.8 10*3/uL (ref 1.4–7.7)
Neutrophils Relative %: 52.9 % (ref 43.0–77.0)
Platelets: 353 10*3/uL (ref 150.0–400.0)
RBC: 4.29 Mil/uL (ref 3.87–5.11)
RDW: 16.5 % — ABNORMAL HIGH (ref 11.5–15.5)
WBC: 7.2 10*3/uL (ref 4.0–10.5)

## 2023-10-31 LAB — LIPASE: Lipase: 8 U/L — ABNORMAL LOW (ref 11.0–59.0)

## 2023-10-31 LAB — GAMMA GT: GGT: 17 U/L (ref 7–51)

## 2023-10-31 MED ORDER — SUCRALFATE 1 G PO TABS: 1.0000 g | ORAL_TABLET | Freq: Three times a day (TID) | ORAL | 0 refills | Status: AC

## 2023-10-31 NOTE — Progress Notes (Signed)
 Established Patient Office Visit   Subjective  Patient ID: Terri Gray, female    DOB: 1955-11-19  Age: 68 y.o. MRN: [id number]  Chief Complaint  Patient presents with   Medical Management of Chronic Issues    Back pain follow-up     Patient is a 68 year old female with pmh sig for GERD, urticaria, psoriasis, asthma, seasonal allergies, hypothyroidism, TMJ, DM who was seen for follow-up on ongoing concern.  Pt w/ continued severe abdominal pain for about a month, initially thought to be a pulled muscle, but it has since worsened. The pain radiates from her side to the lower area between her ribs and is described as intense and aching, sometimes feeling like something is sitting under her skin. It is exacerbated by eating or drinking, leading to nausea and a sensation of wanting to vomit. The pain and nausea occur shortly after eating, with symptoms starting within five to ten minutes. The pain is accompanied by a sour stomach feeling, bloating, and significant gas, primarily in the upper abdomen.  CT stone study 10/22/23 with moderate stool, no constipation or obstipation with normal gallbladder, colon, and kidneys.  Last seen by GI ~4 yrs ago.  She has a history of IBS, which runs in her family, but she states that this current pain is different from her usual symptoms. Her daughter had similar symptoms and was found to have a non-functioning gallbladder and a hernia.  She reports a loss of taste and smell since having COVID-19, which has affected her appetite. She forces herself to drink fluids due to her diabetes, despite not feeling the urge to eat or drink.    Patient Active Problem List   Diagnosis Date Noted   Psoriasis 10/13/2021   GERD (gastroesophageal reflux disease) 02/09/2020   Urticaria 02/03/2020   Seasonal and perennial allergic rhinitis 02/03/2020   Allergic conjunctivitis 02/03/2020   Moderate persistent asthma 02/03/2020   Acquired hypothyroidism 12/17/2019   TMJ  (temporomandibular joint disorder) 12/17/2019   Seasonal allergies 12/17/2019   Foot pain, left 07/16/2018   Palpitations 07/10/2018   Dyspnea on exertion 07/10/2018   Atypical chest pain 07/10/2018   Dizziness 07/10/2018   Localized osteoarthritis of right knee 02/28/2018   Radiculopathy of cervical spine 02/28/2018   Goiter 11/12/2017   Diabetes (HCC) 05/30/2017   Past Medical History:  Diagnosis Date   Allergy     Arthritis    Asthma    Chicken pox    Colon polyps    Diabetes mellitus without complication (HCC)    GERD (gastroesophageal reflux disease)    H/O fracture of nose    Broken by ENT   History of colon polyps    Hyperlipidemia    Hypertension    IBS (irritable bowel syndrome)    Migraines    Obesity    Shingles 2012   Sleep apnea    on CPAP machine but uses it 90% of the time because it gives her some complication and does a mouth guard as well   Thyroid  disease    Past Surgical History:  Procedure Laterality Date   COLONOSCOPY     Around 2014- 2015 in [place]    ESOPHAGOGASTRODUODENOSCOPY     Around 2014-2015 [place]    REPLACEMENT TOTAL KNEE Left 2016   Social History   Tobacco Use   Smoking status: Never   Smokeless tobacco: Never  Vaping Use   Vaping status: Never Used  Substance Use Topics   Alcohol use: No  Comment: last drink was over 30 years ago   Drug use: No   Family History  Problem Relation Age of Onset   Bronchitis Mother    Ovarian cancer Mother 70   GER disease Mother    Colon polyps Mother    Irritable bowel syndrome Mother    Diabetes Father    Kidney disease Father    Heart failure Father    Irritable bowel syndrome Father    Diabetes Sister    Asthma Sister    Urticaria Sister    Diabetes Sister    Allergic rhinitis Daughter    GER disease Daughter    Irritable bowel syndrome Daughter    Irritable bowel syndrome Daughter    Diabetes Brother    Allergic rhinitis Son    Allergic rhinitis  Grandchild    Food Allergy  Grandchild    Immunodeficiency Neg Hx    Angioedema Neg Hx    Breast cancer Neg Hx    Allergies  Allergen Reactions   Gabapentin     Stomach ache    ROS Negative unless stated above    Objective:      BP (!) 142/86 (BP Location: Left Arm, Patient Position: Sitting)   Pulse 81   Temp 99 F (37.2 C) (Oral)   Ht 5' 6 (1.676 m)   Wt 196 lb 3.2 oz (89 kg)   LMP  (LMP Unknown)   SpO2 99%   BMI 31.67 kg/m  BP Readings from Last 3 Encounters:  10/31/23 (!) 142/86  10/22/23 (!) 144/90  09/04/22 (!) 148/96   Wt Readings from Last 3 Encounters:  10/31/23 196 lb 3.2 oz (89 kg)  10/22/23 198 lb (89.8 kg)  05/15/23 183 lb (83 kg)      Physical Exam Constitutional:      General: She is not in acute distress.    Appearance: Normal appearance.  HENT:     Head: Normocephalic and atraumatic.     Nose: Nose normal.     Mouth/Throat:     Mouth: Mucous membranes are moist.  Cardiovascular:     Rate and Rhythm: Normal rate and regular rhythm.     Heart sounds: Normal heart sounds. No murmur heard.    No gallop.  Pulmonary:     Effort: Pulmonary effort is normal. No respiratory distress.     Breath sounds: Normal breath sounds. No wheezing, rhonchi or rales.  Abdominal:     General: Bowel sounds are normal.     Palpations: Abdomen is soft.     Tenderness: There is abdominal tenderness in the right upper quadrant, right lower quadrant, epigastric area and left upper quadrant. There is no guarding or rebound.  Skin:    General: Skin is warm and dry.  Neurological:     Mental Status: She is alert and oriented to person, place, and time.        09/04/2022    1:31 PM 08/21/2022    3:38 PM 04/05/2022    1:47 PM  Depression screen PHQ 2/9  Decreased Interest 0 0 0  Down, Depressed, Hopeless 0 0 0  PHQ - 2 Score 0 0 0  Altered sleeping 1    Tired, decreased energy 1    Change in appetite 0    Feeling bad or failure about yourself  0     Trouble concentrating 0    Moving slowly or fidgety/restless 0    Suicidal thoughts 0    PHQ-9 Score 2  Difficult doing work/chores Not difficult at all        09/04/2022    1:31 PM  GAD 7 : Generalized Anxiety Score  Nervous, Anxious, on Edge 0  Control/stop worrying 0  Worry too much - different things 0  Trouble relaxing 0  Restless 0  Easily annoyed or irritable 0  Afraid - awful might happen 0  Total GAD 7 Score 0     No results found for any visits on 10/31/23.    Assessment & Plan:   Generalized abdominal pain -     Ambulatory referral to Gastroenterology -     Sucralfate; Take 1 tablet (1 g total) by mouth 3 (three) times daily before meals.  Dispense: 90 tablet; Refill: 0 -     Comprehensive metabolic panel with GFR -     CBC with Differential/Platelet -     Lipase -     Gamma GT  Nausea and vomiting, unspecified vomiting type -     Ambulatory referral to Gastroenterology -     Sucralfate; Take 1 tablet (1 g total) by mouth 3 (three) times daily before meals.  Dispense: 90 tablet; Refill: 0 -     Comprehensive metabolic panel with GFR -     CBC with Differential/Platelet -     Lipase -     Gamma GT  Type 2 diabetes mellitus with diabetic polyneuropathy, with long-term current use of insulin  (HCC)  Essential hypertension  Acquired hypothyroidism   Abdominal Pain   She experiences severe abdominal pain with nausea, worsened by eating. A CT scan showed no stones, and labs were normal except for slight eosinophilia.  Okay to stop Flomax , started 10/22/2023.  The differential diagnosis includes peptic ulcer disease and possible pancreatitis. Although she has a history of IBS, this pain is different from previous episodes.  Discussed other possible causes of symptoms now that no association with food has been established including peptic ulcer, choledocholithiasis though no changes in recent labs, pancreatitis.  Start Carafate for a potential ulcer.  Referral to  gastroenterology placed.  Given strict ED precautions for inability to keep food down or if the pain worsens.  Diabetes Mellitus   Hemoglobin A1c 6.1% on 10/22/2023.  Continue current medications including her diabetes is managed with Lantus  and Metformin  1000 mg twice daily.  Hypertension   BP elevated, possibly 2/2 pain.    Continue current meds.  Hypothyroidism Dose of synthroid  decreased to 50 mcg daily due to TSH 0.23 on 10/22/23.  Recheck TSH in 4-6 wks from that date.  Order placed.  Return if symptoms worsen or fail to improve.   Viola Greulich, MD

## 2023-11-02 ENCOUNTER — Other Ambulatory Visit: Payer: Self-pay | Admitting: Family Medicine

## 2023-11-02 DIAGNOSIS — N23 Unspecified renal colic: Secondary | ICD-10-CM

## 2023-11-09 DIAGNOSIS — L4059 Other psoriatic arthropathy: Secondary | ICD-10-CM | POA: Diagnosis not present

## 2023-11-09 DIAGNOSIS — Z79899 Other long term (current) drug therapy: Secondary | ICD-10-CM | POA: Diagnosis not present

## 2023-11-13 ENCOUNTER — Ambulatory Visit
Admission: RE | Admit: 2023-11-13 | Discharge: 2023-11-13 | Disposition: A | Discharge status: Home / Self Care | Source: Ambulatory Visit | Attending: Family Medicine | Admitting: Family Medicine

## 2023-11-13 ENCOUNTER — Ambulatory Visit
Admission: RE | Admit: 2023-11-13 | Discharge: 2023-11-13 | Disposition: A | Discharge status: Home / Self Care | Source: Ambulatory Visit | Attending: Family Medicine

## 2023-11-13 DIAGNOSIS — N6452 Nipple discharge: Secondary | ICD-10-CM | POA: Diagnosis not present

## 2023-11-15 DIAGNOSIS — E1142 Type 2 diabetes mellitus with diabetic polyneuropathy: Secondary | ICD-10-CM | POA: Diagnosis not present

## 2023-11-15 DIAGNOSIS — M25511 Pain in right shoulder: Secondary | ICD-10-CM | POA: Diagnosis not present

## 2023-11-15 DIAGNOSIS — Z794 Long term (current) use of insulin: Secondary | ICD-10-CM | POA: Diagnosis not present

## 2023-11-15 DIAGNOSIS — M5412 Radiculopathy, cervical region: Secondary | ICD-10-CM | POA: Diagnosis not present

## 2023-11-15 DIAGNOSIS — M25512 Pain in left shoulder: Secondary | ICD-10-CM | POA: Diagnosis not present

## 2023-11-19 ENCOUNTER — Other Ambulatory Visit

## 2023-11-19 ENCOUNTER — Ambulatory Visit: Payer: Self-pay | Admitting: Family Medicine

## 2023-11-19 ENCOUNTER — Encounter

## 2023-11-21 DIAGNOSIS — M5412 Radiculopathy, cervical region: Secondary | ICD-10-CM | POA: Diagnosis not present

## 2023-11-26 DIAGNOSIS — M5412 Radiculopathy, cervical region: Secondary | ICD-10-CM | POA: Diagnosis not present

## 2023-11-26 DIAGNOSIS — M5011 Cervical disc disorder with radiculopathy,  high cervical region: Secondary | ICD-10-CM | POA: Diagnosis not present

## 2023-11-26 DIAGNOSIS — M4722 Other spondylosis with radiculopathy, cervical region: Secondary | ICD-10-CM | POA: Diagnosis not present

## 2023-11-26 DIAGNOSIS — M4802 Spinal stenosis, cervical region: Secondary | ICD-10-CM | POA: Diagnosis not present

## 2023-11-26 DIAGNOSIS — M4803 Spinal stenosis, cervicothoracic region: Secondary | ICD-10-CM | POA: Diagnosis not present

## 2023-12-04 ENCOUNTER — Other Ambulatory Visit: Payer: Self-pay | Admitting: Family Medicine

## 2023-12-04 DIAGNOSIS — R1084 Generalized abdominal pain: Secondary | ICD-10-CM

## 2023-12-04 DIAGNOSIS — R112 Nausea with vomiting, unspecified: Secondary | ICD-10-CM

## 2023-12-07 DIAGNOSIS — G5601 Carpal tunnel syndrome, right upper limb: Secondary | ICD-10-CM | POA: Diagnosis not present

## 2023-12-07 DIAGNOSIS — G5621 Lesion of ulnar nerve, right upper limb: Secondary | ICD-10-CM | POA: Diagnosis not present

## 2023-12-26 ENCOUNTER — Ambulatory Visit: Admitting: Gastroenterology

## 2023-12-26 ENCOUNTER — Ambulatory Visit: Payer: Self-pay | Admitting: Gastroenterology

## 2023-12-26 ENCOUNTER — Other Ambulatory Visit: Payer: Self-pay | Admitting: Gastroenterology

## 2023-12-26 ENCOUNTER — Ambulatory Visit (INDEPENDENT_AMBULATORY_CARE_PROVIDER_SITE_OTHER)
Admission: RE | Admit: 2023-12-26 | Discharge: 2023-12-26 | Disposition: A | Discharge status: Home / Self Care | Source: Ambulatory Visit | Attending: Gastroenterology

## 2023-12-26 ENCOUNTER — Encounter: Payer: Self-pay | Admitting: Gastroenterology

## 2023-12-26 VITALS — BP 130/74 | HR 76 | Ht 64.57 in | Wt 195.4 lb

## 2023-12-26 DIAGNOSIS — R1310 Dysphagia, unspecified: Secondary | ICD-10-CM | POA: Diagnosis not present

## 2023-12-26 DIAGNOSIS — R194 Change in bowel habit: Secondary | ICD-10-CM

## 2023-12-26 DIAGNOSIS — G8929 Other chronic pain: Secondary | ICD-10-CM | POA: Diagnosis not present

## 2023-12-26 DIAGNOSIS — R432 Parageusia: Secondary | ICD-10-CM

## 2023-12-26 DIAGNOSIS — K219 Gastro-esophageal reflux disease without esophagitis: Secondary | ICD-10-CM

## 2023-12-26 DIAGNOSIS — R140 Abdominal distension (gaseous): Secondary | ICD-10-CM

## 2023-12-26 DIAGNOSIS — R1319 Other dysphagia: Secondary | ICD-10-CM

## 2023-12-26 DIAGNOSIS — Z8639 Personal history of other endocrine, nutritional and metabolic disease: Secondary | ICD-10-CM | POA: Diagnosis not present

## 2023-12-26 DIAGNOSIS — D649 Anemia, unspecified: Secondary | ICD-10-CM

## 2023-12-26 DIAGNOSIS — E538 Deficiency of other specified B group vitamins: Secondary | ICD-10-CM

## 2023-12-26 DIAGNOSIS — R1030 Lower abdominal pain, unspecified: Secondary | ICD-10-CM | POA: Diagnosis not present

## 2023-12-26 DIAGNOSIS — K117 Disturbances of salivary secretion: Secondary | ICD-10-CM

## 2023-12-26 MED ORDER — LANSOPRAZOLE 30 MG PO CPDR: 30.0000 mg | DELAYED_RELEASE_CAPSULE | Freq: Two times a day (BID) | ORAL | 3 refills | Status: AC

## 2023-12-26 MED ORDER — DEXLANSOPRAZOLE 60 MG PO CPDR: 60.0000 mg | DELAYED_RELEASE_CAPSULE | Freq: Every day | ORAL | 3 refills | Status: DC

## 2023-12-26 NOTE — Progress Notes (Signed)
 Chief Complaint:generalized abd pain, nausea and vomiting Primary GI Doctor: Dr. San  HPI:  Patient is a  68  year old female patient with past medical history of diabetes, HTN, HLD, hypothyroidism, GERD, migraines, OSA (on CPAP), who was referred to me by Mercer Clotilda SAUNDERS, MD on 10/31/23 for a evaluation of abd pain and nausea and vomiting .   Patient last seen in GI office by Dr. San on 05/12/20 for GERD, dysphagia, and hemorrhoids.  Patient initially referred to the Gastroenterology Clinic in 01/2020 for evaluation of GERD along with evaluation of abdominal pain, change in bowel habits.   Longstanding history of reflux with index symptoms of HB, regurgitaiton, sour brash. Worse with greasy, spicy foods. Usually within 1 hour of eating. Previously treated with Dexilant , which worked well, but cost prohibitive and changed to Protonix  40 mg/day a few years ago with suboptimal response. Will occasional take a 2nd dose. Recently started on Pepcid  20 mg/day, but more so for rash (was recently seen by [facility]  Clinic).  -EGD (02/2020)   Change in bowel habits: Increased flatus and BM alternates between diarrhea and constipation. Previously diagnosed with IBS-C, but the diarrhea is new. No hematochezia or melena. Can have associated generalized abd pain, cramping, and bloating. Has trialed Gas-X, Bean-O, stool softeners, and anti-diarrheals with minimal improvement.    Xerostomia, dysphagia:Dry mouth started and occasional frothy, chalky mouth. Worse with dairy and fruits.  Also with intermittent dysphagia and choking sensation. Dysphagia can be solids or liquids, pointing to anterior neck -11/2019: CRP normal, normal WBC but peripheral eosinophilia, H/H 9.6/37.2 -01/2020: ANA negative, ESR 43 -EGD with dilation (02/2020) -Referral for Esophageal Manometry- cancelled and never rescheduled  Previously followed with a Gastroenterologist in [place] .  Reports previously being diagnosed with  IBS along with GERD with hiatal hernia.  EGD/colonoscopy completed around 2014.  Has a history of colon polyps on last colonoscopy, but unknown size, location, histology, etc, and doesn't know when was told to repeat. No reports available for review.   Endoscopic History: -EGD (02/2020): Mild stenosis dilated with 20 mm TTS then fractured with forceps, 3 cm HH, minimal gastritis, normal duodenum (normal biopsies) -Colonoscopy (02/2020): Normal (biopsies negative for MC), normal TI.  Internal hemorrhoids.  Repeat 10 years --05/20/20 UGI- reviewed and notable for small hiatal hernia with significant reflux to the level of the aortic arch.  Otherwise no motility disorder.  Small nonrestrictive ring in the distal esophagus l which corresponds with the recent EGD finding, but this was nonrestrictive and no issue passing contrast or 13 mm barium tablet.  Otherwise normal motility through the proximal small bowel.   Interval History    Patient presents for evaluation of several gastrointestinal issues.  Patient has longstanding history of GERD and currently taking Nexium  p.o. daily.  She reports this did not control her symptoms so she increase it to twice daily.  Patient reports she continues with intermittent heartburn and waterbrash.  Patient has tried pantoprazole  in the past without much improvement.  She has also tried omeprazole 40 mg p.o. daily in the past without any improvement. She has been on Dexilant  in the past which worked well for her, but her insurance no longer covered at the time. Patient complains of sour taste in mouth when she wakes up.      Patient complains of dry mouth. Reports she has had it long time. She has tried special mouth washes from dentist with not much improvement. Also with intermittent dysphagia and choking sensation.  She is seeing rheumatology for joint pain. She does not think she has been evaluated for Sjgren's syndrome.  Patient recently had CT scan for low back  pain to rule out kidney stones which showed some stool buildup. She reports some days she goes well and other days she does not. She feels like she has not emptied. Increased flatus and BM alternates between diarrhea and constipation. No stool leakage. No accidents. She avoids certain fruits due to issues with acid and bubble sensation in back of throat after eating. Has trialed Gas-X, Bean-O, stool softeners, and anti-diarrheals with minimal improvement.   Wt Readings from Last 3 Encounters:  12/26/23 195 lb 6 oz (88.6 kg)  10/31/23 196 lb 3.2 oz (89 kg)  10/22/23 198 lb (89.8 kg)    Past Medical History:  Diagnosis Date   Allergy     Arthritis    Asthma    Chicken pox    Colon polyps    Diabetes mellitus without complication (HCC)    GERD (gastroesophageal reflux disease)    H/O fracture of nose    Broken by ENT   History of colon polyps    Hyperlipidemia    Hypertension    IBS (irritable bowel syndrome)    Migraines    Obesity    Shingles 2012   Sleep apnea    on CPAP machine but uses it 90% of the time because it gives her some complication and does a mouth guard as well   Thyroid  disease     Past Surgical History:  Procedure Laterality Date   COLONOSCOPY     Around 2014- 2015 in [place]    ESOPHAGOGASTRODUODENOSCOPY     Around 2014-2015 [place]    KNEE SURGERY Right 2025   REPLACEMENT TOTAL KNEE Left 2016    Current Outpatient Medications  Medication Sig Dispense Refill   albuterol  (VENTOLIN  HFA) 108 (90 Base) MCG/ACT inhaler INHALE TWO PUFFS BY MOUTH EVERY 6 HOURS AS NEEDED FOR SHORTNESS OF BREATH OR WHEEZING 8.5 g 1   aspirin 325 MG tablet Take 325 mg by mouth as needed.     budesonide -formoterol  (SYMBICORT ) 160-4.5 MCG/ACT inhaler Inhale 2 puffs into the lungs in the morning and at bedtime. 1 each 6   cyclobenzaprine  (FLEXERIL ) 10 MG tablet TAKE 1 TABLET BY MOUTH 3 TIMES A DAY AS NEEDED FOR MUSCLE SPASMS 90 tablet 1   dexlansoprazole  (DEXILANT )  60 MG capsule Take 1 capsule (60 mg total) by mouth daily. 90 capsule 3   diltiazem (CARDIZEM CD) 120 MG 24 hr capsule Take 120 mg by mouth daily.     ferrous sulfate 325 (65 FE) MG tablet Take one tablet by mouth every other day, with a full glass of water.     fluticasone  (FLONASE ) 50 MCG/ACT nasal spray Place 1-2 sprays into both nostrils daily as needed. 16 g 5   folic acid  (FOLVITE ) 1 MG tablet Take 1 tablet by mouth daily.     furosemide  (LASIX ) 20 MG tablet TAKE 1 TABLET BY MOUTH DAILY 90 tablet 0   glucose blood (TRUE METRIX BLOOD GLUCOSE TEST) test strip 1 each by Other route 4 (four) times daily as needed for other. And lancets 2/day 360 each 3   hydrocortisone  valerate ointment (WESTCORT ) 0.2 % Apply 1 application topically 2 (two) times daily. 60 g 1   ibuprofen (ADVIL) 200 MG tablet Take 200 mg by mouth every 8 (eight) hours as needed.     LANTUS  SOLOSTAR 100 UNIT/ML Solostar Pen INJECT  85 UNITS SUBCUTANEOUSLY ONCE DAILY IN THE MORNING 75 mL 0   levocetirizine (XYZAL ) 5 MG tablet Take 5 mg by mouth daily as needed.     levothyroxine  (SYNTHROID ) 50 MCG tablet Take 1 tablet (50 mcg total) by mouth daily. 90 tablet 3   metFORMIN  (GLUCOPHAGE ) 1000 MG tablet Take 1 tablet (1,000 mg total) by mouth 2 (two) times daily with a meal. 180 tablet 3   methotrexate (RHEUMATREX) 2.5 MG tablet Take by mouth.     mometasone (ELOCON) 0.1 % cream Apply topically.     montelukast  (SINGULAIR ) 10 MG tablet TAKE 1 TABLET BY MOUTH AT BEDTIME 90 tablet 3   ondansetron  (ZOFRAN ) 4 MG tablet Take 1 tablet (4 mg total) by mouth every 8 (eight) hours as needed for nausea or vomiting. 20 tablet 0   pimecrolimus (ELIDEL) 1 % cream Apply twice a day on affected areas     predniSONE  (DELTASONE ) 20 MG tablet Take 1 tablet (20 mg total) by mouth 2 (two) times daily with a meal. 10 tablet 0   simvastatin  (ZOCOR ) 40 MG tablet Take 1 tablet (40 mg total) by mouth daily. 90 tablet 3   sucralfate  (CARAFATE ) 1 g tablet Take  1 tablet (1 g total) by mouth 3 (three) times daily before meals. 90 tablet 0   sulfaSALAzine (AZULFIDINE) 500 MG EC tablet Take 1,000 mg by mouth daily.     tacrolimus (PROTOPIC) 0.1 % ointment Apply 1 Application topically 2 (two) times daily.     No current facility-administered medications for this visit.    Allergies as of 12/26/2023 - Review Complete 12/26/2023  Allergen Reaction Noted   Gabapentin  02/21/2019    Family History  Problem Relation Age of Onset   Bronchitis Mother    Ovarian cancer Mother 63   GER disease Mother    Colon polyps Mother    Irritable bowel syndrome Mother    Diabetes Father    Kidney disease Father    Heart failure Father    Irritable bowel syndrome Father    Diabetes Sister    Asthma Sister    Urticaria Sister    Diabetes Sister    Allergic rhinitis Daughter    GER disease Daughter    Irritable bowel syndrome Daughter    Irritable bowel syndrome Daughter    Diabetes Brother    Allergic rhinitis Son    Allergic rhinitis Grandchild    Food Allergy  Grandchild    Immunodeficiency Neg Hx    Angioedema Neg Hx    Breast cancer Neg Hx     Review of Systems:    Constitutional: No weight loss, fever, chills, weakness or fatigue HEENT: Eyes: No change in vision               Ears, Nose, Throat:  No change in hearing or congestion Skin: No rash or itching Cardiovascular: No chest pain, chest pressure or palpitations   Respiratory: No SOB or cough Gastrointestinal: See HPI and otherwise negative Genitourinary: No dysuria or change in urinary frequency Neurological: No headache, dizziness or syncope Musculoskeletal: No new muscle or joint pain Hematologic: No bleeding or bruising Psychiatric: No history of depression or anxiety    Physical Exam:  Vital signs: BP 130/74 (BP Location: Left Arm, Patient Position: Sitting, Cuff Size: Normal)   Pulse 76   Ht 5' 4.57 (1.64 m) Comment: height without shoes  Wt 195 lb 6 oz (88.6 kg)   BMI  32.95 kg/m   Constitutional:  Pleasant female appears to be in NAD, Well developed, Well nourished, alert and cooperative Throat: Oral cavity and pharynx without inflammation, swelling or lesion.  Respiratory: Respirations even and unlabored. Wheezing noted on RLL.SABRA   No crackles, or rhonchi.  Cardiovascular: Normal S1, S2. Regular rate and rhythm. No peripheral edema, cyanosis or pallor.  Gastrointestinal:  Soft, nondistended, nontender.Obese. No rebound or guarding. Hypoactive bowel sounds. No appreciable masses or hepatomegaly. Rectal:  Not performed.  Msk:  Symmetrical without gross deformities. Without edema, no deformity or joint abnormality.  Neurologic:  Alert and  oriented x4;  grossly normal neurologically.  Skin:   Dry and intact without significant lesions or rashes.  RELEVANT LABS AND IMAGING: CBC    Latest Ref Rng & Units 10/31/2023   10:45 AM 10/22/2023    3:39 PM 10/12/2021    9:03 AM  CBC  WBC 4.0 - 10.5 K/uL 7.2  6.2  6.9   Hemoglobin 12.0 - 15.0 g/dL 88.1  88.0  87.7   Hematocrit 36.0 - 46.0 % 35.8  36.3  36.9   Platelets 150.0 - 400.0 K/uL 353.0  363.0  358.0      CMP     Latest Ref Rng & Units 10/31/2023   10:45 AM 10/22/2023    3:39 PM 10/12/2021    9:03 AM  CMP  Glucose 70 - 99 mg/dL 96  82  78   BUN 6 - 23 mg/dL 4  7  9    Creatinine 0.40 - 1.20 mg/dL 9.19  9.16  9.11   Sodium 135 - 145 mEq/L 138  139  138   Potassium 3.5 - 5.1 mEq/L 4.1  3.6  4.6   Chloride 96 - 112 mEq/L 101  101  101   CO2 19 - 32 mEq/L 28  30  31    Calcium 8.4 - 10.5 mg/dL 9.2  9.2  9.5   Total Protein 6.0 - 8.3 g/dL 6.9  7.4  7.5   Total Bilirubin 0.2 - 1.2 mg/dL 0.4  0.4  0.4   Alkaline Phos 39 - 117 U/L 83  80  92   AST 0 - 37 U/L 18  24  19    ALT 0 - 35 U/L 14  21  16       Lab Results  Component Value Date   TSH 0.23 (L) 10/22/2023  Imaging: CT scan renal stone  IMPRESSION: *No acute findings in the abdomen or pelvis. *Moderate amount of residual fecal material  throughout the colon without obstruction or constipation.  GI procedures: 03/10/20 colonoscopy/EGD Colonoscopy - Hemorrhoids found on perianal exam. - The entire examined colon is normal. Biopsied. - Non- bleeding internal hemorrhoids. - The examined portion of the ileum was normal. EGD for abd pain, dysphagia, reflux, diarrhea - Benign- appearing esophageal stenosis. Dilated with 20 mm TTS balloon then fractured with cold forceps.- 3 cm hiatal hernia. - Normal upper third of esophagus and middle third of esophagus. - Gastritis. Biopsied. - Normal gastric fundus and gastric body. Biopsied. - Normal examined duodenum. Biopsied.   Assessment: Encounter Diagnoses  Name Primary?   Gastroesophageal reflux disease without esophagitis Yes   Esophageal dysphagia    Altered taste    Xerostomia    Altered bowel habits    Bloating      67 year old female patient with uncontrolled GERD on Nexium  twice daily.  Patient has been on it and trialed and failed omeprazole and pantoprazole .  Will go ahead and send generic Dexilant  to see if  it is covered and if not [place] consider lansoprazole .  Reinforced GERD diet.  EGD w/ dilatation 02/2020.  Patient also with chronic Xerostomia, dysphagia.  During her last appointment with Dr. San he had recommended esophageal manometry which was never completed.  Upper GI series revealed no motility disorder.  Patient would like to see if new reflux medication helps and if not she agreed to pursue manometry, ph impedence study. For the altered bowel habits with bloating patient symptoms are more consistent with obstipation, we will go ahead and proceed with abdominal x-ray to evaluate stool burden prior to initiating treatment.  Recommended high-fiber diet, patient has to avoid multiple foods due to issues with reflux. If constipation present will start daily Miralax. UTD on colon screening. Last TSH low at 0.23, adjustment to dosing made. Mild anemia noted, baseline  since 06/2018 has been (11-12). History of vitamin B 12 deficiency. This can also cause dry mouth. Will recheck b12 level.  Plan: -Switch from Nexium  to Dexlansoprazole  60 mg po daily, if no improvement we discussed pursing esophageal manometry/Impedence study recommended at last appointment  - Take Pepcid  at bedtime - Reinforced GERD diet -Continue antireflux lifestyle/dietary modifications  -check b12 level - Order Abd xray 2 view r/o constipation -Recommend high fiber diet -colonoscopy 02/2030  Thank you for the courtesy of this consult. Please call me with any questions or concerns.   Elim Peale, FNP-C [facility] 12/26/2023, 12:17 PM  Cc: Mercer Clotilda SAUNDERS, MD

## 2023-12-26 NOTE — Progress Notes (Signed)
 Dexilant  generic not covered, will send lansoprazole 

## 2023-12-26 NOTE — Progress Notes (Signed)
 Agree with the assessment and plan as outlined by [facility], FNP-C.  Carlitos Bottino, DO, [facility]

## 2023-12-26 NOTE — Patient Instructions (Addendum)
 Altered bowel habits Recommend high fiber diet Will order abdominal xray to rule out constipation   GERD Start Dexlansoprazole  60 mg po daily  Can take Pepcid  at bedtime if needed. If not covered let office know and will switch to something else  Your provider has requested that you go to the basement level for lab work before leaving today. Press B on the elevator. The lab is located at the first door on the left as you exit the elevator.  _______________________________________________________  If your blood pressure at your visit was 140/90 or greater, please contact your primary care physician to follow up on this.  _______________________________________________________  If you are age 43 or older, your body mass index should be between 23-30. Your Body mass index is 32.95 kg/m. If this is out of the aforementioned range listed, please consider follow up with your Primary Care Provider.  If you are age 49 or younger, your body mass index should be between 19-25. Your Body mass index is 32.95 kg/m. If this is out of the aformentioned range listed, please consider follow up with your Primary Care Provider.   ________________________________________________________  The [facility] providers would like to encourage you to use MYCHART to communicate with providers for non-urgent requests or questions.  Due to long hold times on the telephone, sending your provider a message by [facility] may be a faster and more efficient way to get a response.  Please allow 48 business hours for a response.  Please remember that this is for non-urgent requests.  _______________________________________________________  [facility] is using a team-based approach to care.  Your team is made up of your doctor and two to three APPS. Our APPS (Nurse Practitioners and Physician Assistants) work with your physician to ensure care continuity for you. They are fully qualified to address your health  concerns and develop a treatment plan. They communicate directly with your gastroenterologist to care for you. Seeing the Advanced Practice Practitioners on your physician's team can help you by facilitating care more promptly, often allowing for earlier appointments, access to diagnostic testing, procedures, and other specialty referrals.   Thank you for trusting me with your gastrointestinal care. Deanna May, FNP-C

## 2024-01-16 ENCOUNTER — Other Ambulatory Visit: Payer: Self-pay | Admitting: Family Medicine

## 2024-01-16 DIAGNOSIS — I10 Essential (primary) hypertension: Secondary | ICD-10-CM

## 2024-01-24 DIAGNOSIS — G5601 Carpal tunnel syndrome, right upper limb: Secondary | ICD-10-CM | POA: Diagnosis not present

## 2024-01-24 DIAGNOSIS — G5621 Lesion of ulnar nerve, right upper limb: Secondary | ICD-10-CM | POA: Diagnosis not present

## 2024-01-26 ENCOUNTER — Other Ambulatory Visit: Payer: Self-pay | Admitting: Family Medicine

## 2024-01-26 DIAGNOSIS — M26609 Unspecified temporomandibular joint disorder, unspecified side: Secondary | ICD-10-CM

## 2024-02-19 ENCOUNTER — Encounter (INDEPENDENT_AMBULATORY_CARE_PROVIDER_SITE_OTHER): Admitting: Family Medicine

## 2024-02-19 DIAGNOSIS — Z794 Long term (current) use of insulin: Secondary | ICD-10-CM | POA: Diagnosis not present

## 2024-02-19 DIAGNOSIS — I10 Essential (primary) hypertension: Secondary | ICD-10-CM | POA: Diagnosis not present

## 2024-02-19 DIAGNOSIS — E782 Mixed hyperlipidemia: Secondary | ICD-10-CM | POA: Diagnosis not present

## 2024-02-19 DIAGNOSIS — E1142 Type 2 diabetes mellitus with diabetic polyneuropathy: Secondary | ICD-10-CM | POA: Diagnosis not present

## 2024-02-19 NOTE — Progress Notes (Signed)
 Error

## 2024-04-07 ENCOUNTER — Ambulatory Visit

## 2024-04-07 VITALS — BP 120/70 | HR 72 | Temp 98.2°F | Ht 64.57 in | Wt 185.0 lb

## 2024-04-07 DIAGNOSIS — Z1231 Encounter for screening mammogram for malignant neoplasm of breast: Secondary | ICD-10-CM

## 2024-04-07 DIAGNOSIS — Z0000 Encounter for general adult medical examination without abnormal findings: Secondary | ICD-10-CM

## 2024-04-07 NOTE — Patient Instructions (Addendum)
 Terri Gray,  Thank you for taking the time for your Medicare Wellness Visit. I appreciate your continued commitment to your health goals. Please review the care plan we discussed, and feel free to reach out if I can assist you further.  Please note that Annual Wellness Visits do not include a physical exam. Some assessments may be limited, especially if the visit was conducted virtually. If needed, we may recommend an in-person follow-up with your provider.  Ongoing Care Seeing your primary care provider every 3 to 6 months helps us  monitor your health and provide consistent, personalized care. Last office visit 10/31/2023.  You are due for a Shingrix vaccine and can get that done at your local pharmacy.  You are also due for a foot exam, which you will get done during your next office visit.  Each day, aim for 6 glasses of water, plenty of protein in your diet and try to get up and walk/ stretch every hour for 5-10 minutes at a time.    Referrals If a referral was made during today's visit and you haven't received any updates within two weeks, please contact the referred provider directly to check on the status.  Please call to get scheduled for a mammogram.  [facility] Address: [address], [place], [place] [address] Phone: [phone]   Recommended Screenings:  Health Maintenance  Topic Date Due   Complete foot exam   07/17/2019   Zoster (Shingles) Vaccine (2 of 2) 12/23/2019   Medicare Annual Wellness Visit  08/21/2023   Flu Shot  12/21/2023   Eye exam for diabetics  12/27/2023   COVID-19 Vaccine (6 - 2025-26 season) 01/21/2024   Hemoglobin A1C  04/22/2024   Yearly kidney health urinalysis for diabetes  10/21/2024   Yearly kidney function blood test for diabetes  10/30/2024   Breast Cancer Screening  01/17/2025   Colon Cancer Screening  03/10/2030   DTaP/Tdap/Td vaccine (2 - Td or Tdap) 01/29/2033   Pneumococcal Vaccine for age over 7   Completed   DEXA scan (bone density measurement)  Completed   Hepatitis C Screening  Completed   Meningitis B Vaccine  Aged Out       04/07/2024    3:43 PM  Advanced Directives  Does Patient Have a Medical Advance Directive? No    Vision: Annual vision screenings are recommended for early detection of glaucoma, cataracts, and diabetic retinopathy. These exams can also reveal signs of chronic conditions such as diabetes and high blood pressure.  Dental: Annual dental screenings help detect early signs of oral cancer, gum disease, and other conditions linked to overall health, including heart disease and diabetes.  Please see the attached documents for additional preventive care recommendations.

## 2024-04-07 NOTE — Progress Notes (Signed)
 Chief Complaint  Patient presents with   Medicare Wellness     Subjective:   Terri Gray is a 68 y.o. female who presents for a Medicare Annual Wellness Visit.  Allergies (verified) Gabapentin   History: Past Medical History:  Diagnosis Date   Allergy     Arthritis    Asthma    Chicken pox    Colon polyps    Diabetes mellitus without complication (HCC)    GERD (gastroesophageal reflux disease)    H/O fracture of nose    Broken by ENT   History of colon polyps    Hyperlipidemia    Hypertension    IBS (irritable bowel syndrome)    Migraines    Obesity    Shingles 2012   Sleep apnea    on CPAP machine but uses it 90% of the time because it gives her some complication and does a mouth guard as well   Thyroid  disease    Past Surgical History:  Procedure Laterality Date   COLONOSCOPY     Around 2014- 2015 in [place]    ESOPHAGOGASTRODUODENOSCOPY     Around 2014-2015 [place]    KNEE SURGERY Right 2025   REPLACEMENT TOTAL KNEE Left 2016   Family History  Problem Relation Age of Onset   Bronchitis Mother    Ovarian cancer Mother 64   GER disease Mother    Colon polyps Mother    Irritable bowel syndrome Mother    Diabetes Father    Kidney disease Father    Heart failure Father    Irritable bowel syndrome Father    Diabetes Sister    Asthma Sister    Urticaria Sister    Diabetes Sister    Allergic rhinitis Daughter    GER disease Daughter    Irritable bowel syndrome Daughter    Irritable bowel syndrome Daughter    Diabetes Brother    Allergic rhinitis Son    Allergic rhinitis Grandchild    Food Allergy  Grandchild    Immunodeficiency Neg Hx    Angioedema Neg Hx    Breast cancer Neg Hx    Social History   Occupational History   Occupation: RETIRED  Tobacco Use   Smoking status: Never   Smokeless tobacco: Never  Vaping Use   Vaping status: Never Used  Substance and Sexual Activity   Alcohol use: No    Comment: last drink  was over 30 years ago   Drug use: No   Sexual activity: Not on file   Tobacco Counseling Counseling given: Not Answered  SDOH Screenings   Food Insecurity: No Food Insecurity (04/07/2024)  Housing: Unknown (04/07/2024)  Transportation Needs: No Transportation Needs (04/07/2024)  Utilities: Not At Risk (04/07/2024)  Alcohol Screen: Low Risk  (08/17/2021)  Depression (PHQ2-9): Low Risk  (04/07/2024)  Financial Resource Strain: Low Risk  (08/21/2022)  Physical Activity: Insufficiently Active (04/07/2024)  Social Connections: Moderately Isolated (04/07/2024)  Stress: No Stress Concern Present (04/07/2024)  Tobacco Use: Low Risk  (04/07/2024)  Health Literacy: Adequate Health Literacy (04/07/2024)   See flowsheets for full screening details  Depression Screen PHQ 2 & 9 Depression Scale- Over the past 2 weeks, how often have you been bothered by any of the following problems? Little interest or pleasure in doing things: 0 Feeling down, depressed, or hopeless (PHQ Adolescent also includes...irritable): 0 PHQ-2 Total Score: 0 Trouble falling or staying asleep, or sleeping too much: 0 Feeling tired or having little energy: 0 Poor appetite or overeating (PHQ Adolescent  also includes...weight loss): 0 Feeling bad about yourself - or that you are a failure or have let yourself or your family down: 0 Trouble concentrating on things, such as reading the newspaper or watching television (PHQ Adolescent also includes...like school work): 0 Moving or speaking so slowly that other people could have noticed. Or the opposite - being so fidgety or restless that you have been moving around a lot more than usual: 0 Thoughts that you would be better off dead, or of hurting yourself in some way: 0 PHQ-9 Total Score: 0 If you checked off any problems, how difficult have these problems made it for you to do your work, take care of things at home, or get along with other people?: Not difficult at  all  Depression Treatment Depression Interventions/Treatment : EYV7-0 Score <4 Follow-up Not Indicated     Goals Addressed             This Visit's Progress    Patient Stated       To stay healthy and be more active/2025       Visit info / Clinical Intake: Persons participating in visit:: patient Medicare Wellness Visit Mode:: Video Because this visit was a virtual/telehealth visit:: pt reported vitals If Telephone or Video please confirm:: I connected with the patient using audio enabled telemedicine application and verified that I am speaking with the correct person using two identifiers; I discussed the limitations of evaluation and management by telemedicine Patient Location:: at son's home Provider Location:: home Information given by:: patient Interpreter Needed?: No Pre-visit prep was completed: no AWV questionnaire completed by patient prior to visit?: no Living arrangements:: lives with spouse/significant other Patient's Overall Health Status Rating: good Typical amount of pain: none Does pain affect daily life?: no Are you currently prescribed opioids?: no  Dietary Habits and Nutritional Risks How many meals a day?: 2 Eats fruit and vegetables daily?: yes Most meals are obtained by: preparing own meals In the last 2 weeks, have you had any of the following?: none Diabetic:: (!) yes Any non-healing wounds?: no How often do you check your BS?: 1 (once a day) Would you like to be referred to a Nutritionist or for Diabetic Management? : no  Functional Status Activities of Daily Living (to include ambulation/medication): Independent Ambulation: Independent with device- listed below Home Assistive Devices/Equipment: Eyeglasses Medication Administration: Independent Home Management: Independent Manage your own finances?: yes Primary transportation is: driving Concerns about vision?: no *vision screening is required for WTM* Concerns about hearing?: no  Fall  Screening Falls in the past year?: 0 Number of falls in past year: 0 Was there an injury with Fall?: 0 Fall Risk Category Calculator: 0 Patient Fall Risk Level: Low Fall Risk  Fall Risk Patient at Risk for Falls Due to: No Fall Risks Fall risk Follow up: Falls evaluation completed; Falls prevention discussed  Home and Transportation Safety: All rugs have non-skid backing?: N/A, no rugs All stairs or steps have railings?: yes Grab bars in the bathtub or shower?: (!) no Have non-skid surface in bathtub or shower?: yes Good home lighting?: yes Regular seat belt use?: yes Hospital stays in the last year:: no  Cognitive Assessment Difficulty concentrating, remembering, or making decisions? : yes Will 6CIT or Mini Cog be Completed: yes What year is it?: 0 points What month is it?: 0 points Give patient an address phrase to remember (5 components): [address], [place], [place] About what time is it?: 0 points Count backwards from  20 to 1: 0 points Say the months of the year in reverse: 2 points (September) Repeat the address phrase from earlier: 2 points 6 CIT Score: 4 points  Advance Directives (For Healthcare) Does Patient Have a Medical Advance Directive?: No  Reviewed/Updated  Reviewed/Updated: Reviewed All (Medical, Surgical, Family, Medications, Allergies, Care Teams, Patient Goals)        Objective:    Today's Vitals   04/07/24 1533  BP: 120/70  Pulse: 72  Temp: 98.2 F (36.8 C)  Weight: 185 lb (83.9 kg)  Height: 5' 4.57 (1.64 m)   Body mass index is 31.2 kg/m.  Current Medications (verified) Outpatient Encounter Medications as of 04/07/2024  Medication Sig   albuterol  (VENTOLIN  HFA) 108 (90 Base) MCG/ACT inhaler INHALE TWO PUFFS BY MOUTH EVERY 6 HOURS AS NEEDED FOR SHORTNESS OF BREATH OR WHEEZING   aspirin 325 MG tablet Take 325 mg by mouth as needed.   budesonide -formoterol  (SYMBICORT ) 160-4.5 MCG/ACT inhaler Inhale 2 puffs into the lungs in the morning and  at bedtime.   cyclobenzaprine  (FLEXERIL ) 10 MG tablet TAKE 1 TABLET BY MOUTH 3 TIMES A DAY AS NEEDED FOR MUSCLE SPASMS   diltiazem (CARDIZEM CD) 120 MG 24 hr capsule Take 120 mg by mouth daily.   ferrous sulfate 325 (65 FE) MG tablet Take one tablet by mouth every other day, with a full glass of water.   fluticasone  (FLONASE ) 50 MCG/ACT nasal spray Place 1-2 sprays into both nostrils daily as needed.   folic acid  (FOLVITE ) 1 MG tablet Take 1 tablet by mouth daily.   furosemide  (LASIX ) 20 MG tablet TAKE 1 TABLET BY MOUTH DAILY   glucose blood (TRUE METRIX BLOOD GLUCOSE TEST) test strip 1 each by Other route 4 (four) times daily as needed for other. And lancets 2/day   hydrocortisone  valerate ointment (WESTCORT ) 0.2 % Apply 1 application topically 2 (two) times daily.   ibuprofen (ADVIL) 200 MG tablet Take 200 mg by mouth every 8 (eight) hours as needed.   lansoprazole  (PREVACID ) 30 MG capsule Take 1 capsule (30 mg total) by mouth 2 (two) times daily before a meal.   LANTUS  SOLOSTAR 100 UNIT/ML Solostar Pen INJECT 85 UNITS SUBCUTANEOUSLY ONCE DAILY IN THE MORNING   levocetirizine (XYZAL ) 5 MG tablet Take 5 mg by mouth daily as needed.   levothyroxine  (SYNTHROID ) 50 MCG tablet Take 1 tablet (50 mcg total) by mouth daily.   metFORMIN  (GLUCOPHAGE ) 1000 MG tablet Take 1 tablet (1,000 mg total) by mouth 2 (two) times daily with a meal.   methotrexate (RHEUMATREX) 2.5 MG tablet Take by mouth.   mometasone (ELOCON) 0.1 % cream Apply topically.   montelukast  (SINGULAIR ) 10 MG tablet TAKE 1 TABLET BY MOUTH AT BEDTIME   ondansetron  (ZOFRAN ) 4 MG tablet Take 1 tablet (4 mg total) by mouth every 8 (eight) hours as needed for nausea or vomiting.   pimecrolimus (ELIDEL) 1 % cream Apply twice a day on affected areas   predniSONE  (DELTASONE ) 20 MG tablet Take 1 tablet (20 mg total) by mouth 2 (two) times daily with a meal.   simvastatin  (ZOCOR ) 40 MG tablet Take 1 tablet (40 mg total) by mouth daily.    sucralfate  (CARAFATE ) 1 g tablet Take 1 tablet (1 g total) by mouth 3 (three) times daily before meals.   sulfaSALAzine (AZULFIDINE) 500 MG EC tablet Take 1,000 mg by mouth daily.   tacrolimus (PROTOPIC) 0.1 % ointment Apply 1 Application topically 2 (two) times daily.   No facility-administered  encounter medications on file as of 04/07/2024.   Hearing/Vision screen Hearing Screening - Comments:: Denies hearing difficulties   Vision Screening - Comments:: Wears eyeglasses/UTD/[facility] Immunizations and Health Maintenance Health Maintenance  Topic Date Due   FOOT EXAM  07/17/2019   Zoster Vaccines- Shingrix (2 of 2) 12/23/2019   Medicare Annual Wellness (AWV)  08/21/2023   OPHTHALMOLOGY EXAM  12/27/2023   COVID-19 Vaccine (6 - 2025-26 season) 01/21/2024   HEMOGLOBIN A1C  04/22/2024   Diabetic kidney evaluation - Urine ACR  10/21/2024   Diabetic kidney evaluation - eGFR measurement  10/30/2024   Mammogram  01/17/2025   Colonoscopy  03/10/2030   DTaP/Tdap/Td (2 - Td or Tdap) 01/29/2033   Pneumococcal Vaccine: 50+ Years  Completed   Influenza Vaccine  Completed   DEXA SCAN  Completed   Hepatitis C Screening  Completed   Meningococcal B Vaccine  Aged Out        Assessment/Plan:  This is a routine wellness examination for Terri Gray.  Patient Care Team: Mercer Clotilda SAUNDERS, MD as PCP - General (Family Medicine) Liane Sharyne MATSU, [facility] (Inactive) as Pharmacist (Pharmacist) Associates, [facility]  I have personally reviewed and noted the following in the patient's chart:   Medical and social history Use of alcohol, tobacco or illicit drugs  Current medications and supplements including opioid prescriptions. Functional ability and status Nutritional status Physical activity Advanced directives List of other physicians Hospitalizations, surgeries, and ER visits in previous 12 months Vitals Screenings to include cognitive, depression, and falls Referrals and  appointments  No orders of the defined types were placed in this encounter.  In addition, I have reviewed and discussed with patient certain preventive protocols, quality metrics, and best practice recommendations. A written personalized care plan for preventive services as well as general preventive health recommendations were provided to patient.   Aran Menning L Tysin Salada, CMA   04/07/2024   No follow-ups on file.  After Visit Summary: (MyChart) Due to this being a telephonic visit, the after visit summary with patients personalized plan was offered to patient via MyChart   Nurse Notes: Patient is due for a mammogram and orders have been placed.  She is due for a Shingrix vaccine and a foot exam.  Patient stated that she has had an eye exam for this year.  I have sent a request for results out today.  She had no other concerns to address today.

## 2024-04-28 ENCOUNTER — Encounter: Payer: Self-pay | Admitting: Family Medicine

## 2024-04-28 ENCOUNTER — Ambulatory Visit: Admitting: Family Medicine

## 2024-04-28 ENCOUNTER — Ambulatory Visit (INDEPENDENT_AMBULATORY_CARE_PROVIDER_SITE_OTHER)

## 2024-04-28 VITALS — BP 142/84

## 2024-04-28 DIAGNOSIS — M545 Low back pain, unspecified: Secondary | ICD-10-CM

## 2024-04-28 DIAGNOSIS — E1142 Type 2 diabetes mellitus with diabetic polyneuropathy: Secondary | ICD-10-CM

## 2024-04-28 DIAGNOSIS — Z794 Long term (current) use of insulin: Secondary | ICD-10-CM | POA: Diagnosis not present

## 2024-04-28 DIAGNOSIS — R42 Dizziness and giddiness: Secondary | ICD-10-CM

## 2024-04-28 DIAGNOSIS — N6452 Nipple discharge: Secondary | ICD-10-CM

## 2024-04-28 DIAGNOSIS — I10 Essential (primary) hypertension: Secondary | ICD-10-CM | POA: Diagnosis not present

## 2024-04-28 DIAGNOSIS — R252 Cramp and spasm: Secondary | ICD-10-CM

## 2024-04-28 LAB — COMPREHENSIVE METABOLIC PANEL WITH GFR
ALT: 17 U/L (ref 0–35)
AST: 23 U/L (ref 0–37)
Albumin: 4.3 g/dL (ref 3.5–5.2)
Alkaline Phosphatase: 107 U/L (ref 39–117)
BUN: 12 mg/dL (ref 6–23)
CO2: 28 meq/L (ref 19–32)
Calcium: 9.3 mg/dL (ref 8.4–10.5)
Chloride: 98 meq/L (ref 96–112)
Creatinine, Ser: 0.96 mg/dL (ref 0.40–1.20)
GFR: 60.52 mL/min (ref >60.00)
Glucose, Bld: 73 mg/dL (ref 70–99)
Potassium: 4.4 meq/L (ref 3.5–5.1)
Sodium: 138 meq/L (ref 135–145)
Total Bilirubin: 0.6 mg/dL (ref 0.2–1.2)
Total Protein: 7.3 g/dL (ref 6.0–8.3)

## 2024-04-28 LAB — POCT GLYCOSYLATED HEMOGLOBIN (HGB A1C): Hemoglobin A1C: 6.2 % — AB (ref 4.0–5.6)

## 2024-04-28 LAB — TSH: TSH: 0.54 u[IU]/mL (ref 0.35–5.50)

## 2024-04-28 LAB — T4, FREE: Free T4: 0.88 ng/dL (ref 0.60–1.60)

## 2024-04-28 MED ORDER — MECLIZINE HCL 25 MG PO TABS: 25.0000 mg | ORAL_TABLET | Freq: Three times a day (TID) | ORAL | 1 refills | Status: AC | PRN

## 2024-04-28 MED ORDER — POTASSIUM CHLORIDE CRYS ER 20 MEQ PO TBCR: 20.0000 meq | EXTENDED_RELEASE_TABLET | Freq: Every day | ORAL | 1 refills | Status: AC

## 2024-04-28 MED ORDER — TRAMADOL HCL 50 MG PO TABS: 50.0000 mg | ORAL_TABLET | Freq: Two times a day (BID) | ORAL | 0 refills | Status: AC | PRN

## 2024-04-28 NOTE — Progress Notes (Unsigned)
 "  Established Patient Office Visit   Subjective  Patient ID: Terri Gray, female    DOB: 1956-02-26  Age: 68 y.o. MRN: [id number]  Chief Complaint  Patient presents with   Acute Visit    vertigo started on Monday, and lower back pain started 1 week ago rate of pain 8 out of 10     Pt is a 68 yo female seen for ongoing concerns.   She experiences recurrent vertigo that began on Monday, described as the sensation of the room spinning, triggered by lying down and turning her head. The vertigo has been intermittent since then. She has a history of severe vertigo a few years ago, which required therapy and medication, including meclizine , for management.  She reports lower back pain occurring for a couple of weeks, described as sharp and feeling like her lower back is 'breaking.' The pain is most pronounced in the morning upon getting up and moving around. It is localized to the lower back and does not radiate. She has been using hot water, heating pads, and cold therapy to manage the pain, which comes and goes.  History of psoriatic arthritis, which previously caused significant joint swelling and required anti-inflammatory medication. Her joints, particularly her right hand, have improved by 90% after treatment, but she is no longer taking the medication as it has finished.  She experiences leg cramps when stretching her legs.    Patient Active Problem List   Diagnosis Date Noted   Psoriasis 10/13/2021   GERD (gastroesophageal reflux disease) 02/09/2020   Urticaria 02/03/2020   Seasonal and perennial allergic rhinitis 02/03/2020   Allergic conjunctivitis 02/03/2020   Moderate persistent asthma 02/03/2020   Acquired hypothyroidism 12/17/2019   TMJ (temporomandibular joint disorder) 12/17/2019   Seasonal allergies 12/17/2019   Foot pain, left 07/16/2018   Palpitations 07/10/2018   Dyspnea on exertion 07/10/2018   Atypical chest pain 07/10/2018   Dizziness 07/10/2018    Localized osteoarthritis of right knee 02/28/2018   Radiculopathy of cervical spine 02/28/2018   Goiter 11/12/2017   Diabetes (HCC) 05/30/2017   Past Medical History:  Diagnosis Date   Allergy     Arthritis    Asthma    Chicken pox    Colon polyps    Diabetes mellitus without complication (HCC)    GERD (gastroesophageal reflux disease)    H/O fracture of nose    Broken by ENT   History of colon polyps    Hyperlipidemia    Hypertension    IBS (irritable bowel syndrome)    Migraines    Obesity    Shingles 2012   Sleep apnea    on CPAP machine but uses it 90% of the time because it gives her some complication and does a mouth guard as well   Thyroid  disease    Past Surgical History:  Procedure Laterality Date   COLONOSCOPY     Around 2014- 2015 in [place]    ESOPHAGOGASTRODUODENOSCOPY     Around 2014-2015 [place]    KNEE SURGERY Right 2025   REPLACEMENT TOTAL KNEE Left 2016   Social History   Tobacco Use   Smoking status: Never   Smokeless tobacco: Never  Vaping Use   Vaping status: Never Used  Substance Use Topics   Alcohol use: No    Comment: last drink was over 30 years ago   Drug use: No   Family History  Problem Relation Age of Onset   Bronchitis Mother    Ovarian cancer Mother 25  GER disease Mother    Colon polyps Mother    Irritable bowel syndrome Mother    Diabetes Father    Kidney disease Father    Heart failure Father    Irritable bowel syndrome Father    Diabetes Sister    Asthma Sister    Urticaria Sister    Diabetes Sister    Allergic rhinitis Daughter    GER disease Daughter    Irritable bowel syndrome Daughter    Irritable bowel syndrome Daughter    Diabetes Brother    Allergic rhinitis Son    Allergic rhinitis Grandchild    Food Allergy  Grandchild    Immunodeficiency Neg Hx    Angioedema Neg Hx    Breast cancer Neg Hx    Allergies  Allergen Reactions   Gabapentin     Stomach ache    ROS Negative unless  stated above    Objective:     BP (!) 142/84  BP Readings from Last 3 Encounters:  04/28/24 (!) 142/84  04/07/24 120/70  12/26/23 130/74   Wt Readings from Last 3 Encounters:  04/07/24 185 lb (83.9 kg)  12/26/23 195 lb 6 oz (88.6 kg)  10/31/23 196 lb 3.2 oz (89 kg)      Physical Exam Constitutional:      General: She is not in acute distress.    Appearance: Normal appearance.  HENT:     Head: Normocephalic and atraumatic.     Nose: Nose normal.     Mouth/Throat:     Mouth: Mucous membranes are moist.  Eyes:     Extraocular Movements:     Right eye: Nystagmus present.     Left eye: Nystagmus present.     Comments: 2-3 beats nystagmus with right gaze.  Cardiovascular:     Rate and Rhythm: Normal rate and regular rhythm.     Heart sounds: Normal heart sounds. No murmur heard.    No gallop.  Pulmonary:     Effort: Pulmonary effort is normal. No respiratory distress.     Breath sounds: Normal breath sounds. No wheezing, rhonchi or rales.  Musculoskeletal:       Arms:     Comments: TTP of sacroiliac spine and b/l lower back.  Skin:    General: Skin is warm and dry.  Neurological:     Mental Status: She is alert and oriented to person, place, and time.        04/07/2024    3:51 PM 09/04/2022    1:31 PM 08/21/2022    3:38 PM  Depression screen PHQ 2/9  Decreased Interest 0 0 0  Down, Depressed, Hopeless 0 0 0  PHQ - 2 Score 0 0 0  Altered sleeping 0 1   Tired, decreased energy 0 1   Change in appetite 0 0   Feeling bad or failure about yourself  0 0   Trouble concentrating 0 0   Moving slowly or fidgety/restless 0 0   Suicidal thoughts 0 0   PHQ-9 Score 0 2    Difficult doing work/chores Not difficult at all Not difficult at all      Data saved with a previous flowsheet row definition      09/04/2022    1:31 PM  GAD 7 : Generalized Anxiety Score  Nervous, Anxious, on Edge 0  Control/stop worrying 0  Worry too much - different things 0  Trouble  relaxing 0  Restless 0  Easily annoyed or irritable 0  Afraid -  awful might happen 0  Total GAD 7 Score 0     Results for orders placed or performed in visit on 04/28/24  POC HgB A1c  Result Value Ref Range   Hemoglobin A1C 6.2 (A) 4.0 - 5.6 %   HbA1c POC (<> result, manual entry)     HbA1c, POC (prediabetic range)     HbA1c, POC (controlled diabetic range)        Assessment & Plan:   Vertigo -     Meclizine  HCl; Take 1 tablet (25 mg total) by mouth 3 (three) times daily as needed for dizziness.  Dispense: 30 tablet; Refill: 1 -     Comprehensive metabolic panel with GFR; Future  Type 2 diabetes mellitus with diabetic polyneuropathy, with long-term current use of insulin  (HCC) -     POCT glycosylated hemoglobin (Hb A1C)  Essential hypertension -     TSH; Future -     T4, free; Future -     Comprehensive metabolic panel with GFR; Future  Acute bilateral low back pain without sciatica -     DG Lumbar Spine 2-3 Views; Future -     traMADol  HCl; Take 1 tablet (50 mg total) by mouth every 12 (twelve) hours as needed for severe pain (pain score 7-10).  Dispense: 60 tablet; Refill: 0  Muscle cramps -     Potassium Chloride  Crys ER; Take 1 tablet (20 mEq total) by mouth daily.  Dispense: 90 tablet; Refill: 1 -     Comprehensive metabolic panel with GFR; Future  Nipple discharge -     Prolactin; Future -     TSH; Future -     T4, free; Future -     Comprehensive metabolic panel with GFR; Future  Vertigo   Recurrent vertigo is likely due to displaced otoliths, with symptoms worsening during head movements. Previous therapy provided relief. Performed the Epley maneuver to reposition otoliths. Prescribed meclizine  for dizziness, starting with half a dose due to potential drowsiness. Recommended over-the-counter ear drops for dizziness and provided instructions for the home Epley maneuver.  Low back pain   She experiences intermittent sharp low back pain, primarily in the  morning, which improves with heat application. Continue using heat therapy for pain relief.  Psoriatic arthritis   Psoriasis with joint involvement is present, with previous anti-inflammatory treatment proving effective. Current joint soreness is noted, particularly in the SI joints. Continue follow-up with the rheumatologist in February.  Clear nipple discharge, right breast   Persistent clear nipple discharge is present without pain. A previous mammogram was normal. A full mammogram is scheduled for December 26th.  Muscle cramps, lower extremities   Intermittent muscle cramps are possibly due to an electrolyte imbalance from diuretic use. Recommended a potassium supplement to address potential deficiency and advised dissolving it in water for easier ingestion.  Return if symptoms worsen or fail to improve.   Clotilda JONELLE Single, MD "

## 2024-04-29 LAB — PROLACTIN: Prolactin: 7.6 ng/mL

## 2024-05-01 ENCOUNTER — Ambulatory Visit: Payer: Self-pay | Admitting: Family Medicine

## 2024-05-16 ENCOUNTER — Ambulatory Visit
Admission: RE | Admit: 2024-05-16 | Discharge: 2024-05-16 | Disposition: A | Discharge status: Home / Self Care | Source: Ambulatory Visit | Attending: Family Medicine | Admitting: Family Medicine

## 2024-05-16 DIAGNOSIS — Z1231 Encounter for screening mammogram for malignant neoplasm of breast: Secondary | ICD-10-CM

## 2024-05-19 ENCOUNTER — Other Ambulatory Visit: Payer: Self-pay | Admitting: Family Medicine

## 2024-05-19 DIAGNOSIS — M26609 Unspecified temporomandibular joint disorder, unspecified side: Secondary | ICD-10-CM

## 2024-06-20 NOTE — Progress Notes (Signed)
 Terri Gray                                          MRN: [id number]   06/20/2024   The [facility] Quality Team Specialist reviewed this patient medical record for the purposes of chart review for care gap closure. The following were reviewed: abstraction for care gap closure-glycemic status assessment.    [facility] Quality Team
